# Patient Record
Sex: Female | Born: 1950
Health system: Southern US, Community
[De-identification: ages and names within clinical notes are randomized; demographics above are authoritative.]

## PROBLEM LIST (undated history)

## (undated) DIAGNOSIS — D649 Anemia, unspecified: Secondary | ICD-10-CM

## (undated) DIAGNOSIS — D126 Benign neoplasm of colon, unspecified: Secondary | ICD-10-CM

## (undated) DIAGNOSIS — C801 Malignant (primary) neoplasm, unspecified: Secondary | ICD-10-CM

## (undated) DIAGNOSIS — R109 Unspecified abdominal pain: Secondary | ICD-10-CM

## (undated) DIAGNOSIS — D1803 Hemangioma of intra-abdominal structures: Secondary | ICD-10-CM

## (undated) DIAGNOSIS — I509 Heart failure, unspecified: Secondary | ICD-10-CM

## (undated) DIAGNOSIS — M545 Low back pain, unspecified: Secondary | ICD-10-CM

## (undated) DIAGNOSIS — I10 Essential (primary) hypertension: Secondary | ICD-10-CM

## (undated) DIAGNOSIS — R112 Nausea with vomiting, unspecified: Secondary | ICD-10-CM

## (undated) DIAGNOSIS — M1990 Unspecified osteoarthritis, unspecified site: Secondary | ICD-10-CM

## (undated) DIAGNOSIS — Z9289 Personal history of other medical treatment: Secondary | ICD-10-CM

## (undated) DIAGNOSIS — G629 Polyneuropathy, unspecified: Secondary | ICD-10-CM

## (undated) DIAGNOSIS — K589 Irritable bowel syndrome without diarrhea: Secondary | ICD-10-CM

## (undated) DIAGNOSIS — F419 Anxiety disorder, unspecified: Secondary | ICD-10-CM

## (undated) DIAGNOSIS — R0602 Shortness of breath: Secondary | ICD-10-CM

## (undated) DIAGNOSIS — K219 Gastro-esophageal reflux disease without esophagitis: Secondary | ICD-10-CM

## (undated) DIAGNOSIS — Z9889 Other specified postprocedural states: Secondary | ICD-10-CM

## (undated) DIAGNOSIS — E039 Hypothyroidism, unspecified: Secondary | ICD-10-CM

## (undated) DIAGNOSIS — M797 Fibromyalgia: Secondary | ICD-10-CM

## (undated) DIAGNOSIS — K648 Other hemorrhoids: Secondary | ICD-10-CM

## (undated) DIAGNOSIS — G8929 Other chronic pain: Secondary | ICD-10-CM

## (undated) DIAGNOSIS — K7689 Other specified diseases of liver: Secondary | ICD-10-CM

## (undated) DIAGNOSIS — R51 Headache: Secondary | ICD-10-CM

## (undated) DIAGNOSIS — E7800 Pure hypercholesterolemia, unspecified: Secondary | ICD-10-CM

## (undated) DIAGNOSIS — G4730 Sleep apnea, unspecified: Secondary | ICD-10-CM

## (undated) HISTORY — PX: ABDOMINAL HYSTERECTOMY: SHX81

## (undated) HISTORY — DX: Other chronic pain: G89.29

## (undated) HISTORY — PX: COLONOSCOPY W/ POLYPECTOMY: SHX1380

## (undated) HISTORY — DX: Anxiety disorder, unspecified: F41.9

## (undated) HISTORY — DX: Other specified diseases of liver: K76.89

## (undated) HISTORY — PX: CYSTOCELE REPAIR: SHX163

## (undated) HISTORY — DX: Unspecified abdominal pain: R10.9

## (undated) HISTORY — DX: Low back pain, unspecified: M54.50

## (undated) HISTORY — PX: PARTIAL HYSTERECTOMY: SHX80

## (undated) HISTORY — DX: Heart failure, unspecified: I50.9

## (undated) HISTORY — DX: Fibromyalgia: M79.7

## (undated) HISTORY — DX: Irritable bowel syndrome, unspecified: K58.9

## (undated) HISTORY — DX: Benign neoplasm of colon, unspecified: D12.6

## (undated) HISTORY — DX: Low back pain: M54.5

## (undated) HISTORY — DX: Essential (primary) hypertension: I10

## (undated) HISTORY — DX: Other hemorrhoids: K64.8

## (undated) HISTORY — DX: Gastro-esophageal reflux disease without esophagitis: K21.9

## (undated) HISTORY — DX: Sleep apnea, unspecified: G47.30

## (undated) HISTORY — DX: Hemangioma of intra-abdominal structures: D18.03

---

## 1973-04-02 HISTORY — PX: FRACTURE SURGERY: SHX138

## 1983-04-03 HISTORY — PX: CHOLECYSTECTOMY: SHX55

## 1999-09-02 ENCOUNTER — Encounter: Admission: RE | Admit: 1999-09-02 | Discharge: 1999-09-02 | Payer: Self-pay | Admitting: *Deleted

## 1999-09-11 ENCOUNTER — Encounter: Admission: RE | Admit: 1999-09-11 | Discharge: 1999-09-11 | Payer: Self-pay | Admitting: Internal Medicine

## 1999-12-24 ENCOUNTER — Emergency Department (HOSPITAL_COMMUNITY): Admission: EM | Admit: 1999-12-24 | Discharge: 1999-12-24 | Payer: Self-pay | Admitting: Emergency Medicine

## 1999-12-24 ENCOUNTER — Encounter: Payer: Self-pay | Admitting: Emergency Medicine

## 2000-05-18 ENCOUNTER — Encounter: Admission: RE | Admit: 2000-05-18 | Discharge: 2001-03-17 | Payer: Self-pay | Admitting: *Deleted

## 2000-05-27 ENCOUNTER — Ambulatory Visit (HOSPITAL_COMMUNITY): Admission: RE | Admit: 2000-05-27 | Discharge: 2000-05-27 | Payer: Self-pay | Admitting: Gastroenterology

## 2000-05-27 ENCOUNTER — Encounter (INDEPENDENT_AMBULATORY_CARE_PROVIDER_SITE_OTHER): Payer: Self-pay | Admitting: Specialist

## 2000-12-19 ENCOUNTER — Emergency Department (HOSPITAL_COMMUNITY): Admission: EM | Admit: 2000-12-19 | Discharge: 2000-12-19 | Payer: Self-pay

## 2001-02-28 ENCOUNTER — Emergency Department (HOSPITAL_COMMUNITY): Admission: EM | Admit: 2001-02-28 | Discharge: 2001-02-28 | Payer: Self-pay

## 2001-07-30 ENCOUNTER — Ambulatory Visit (HOSPITAL_COMMUNITY): Admission: RE | Admit: 2001-07-30 | Discharge: 2001-07-30 | Payer: Self-pay | Admitting: Internal Medicine

## 2001-07-30 ENCOUNTER — Encounter: Payer: Self-pay | Admitting: Internal Medicine

## 2001-10-27 ENCOUNTER — Ambulatory Visit (HOSPITAL_COMMUNITY): Admission: RE | Admit: 2001-10-27 | Discharge: 2001-10-27 | Payer: Self-pay | Admitting: Family Medicine

## 2001-10-27 ENCOUNTER — Encounter: Payer: Self-pay | Admitting: Family Medicine

## 2001-10-31 ENCOUNTER — Ambulatory Visit (HOSPITAL_COMMUNITY): Admission: RE | Admit: 2001-10-31 | Discharge: 2001-10-31 | Payer: Self-pay | Admitting: Family Medicine

## 2001-10-31 ENCOUNTER — Encounter: Payer: Self-pay | Admitting: Family Medicine

## 2001-11-17 ENCOUNTER — Encounter: Payer: Self-pay | Admitting: Internal Medicine

## 2001-11-17 ENCOUNTER — Ambulatory Visit (HOSPITAL_COMMUNITY): Admission: RE | Admit: 2001-11-17 | Discharge: 2001-11-17 | Payer: Self-pay | Admitting: Internal Medicine

## 2001-11-20 ENCOUNTER — Encounter (HOSPITAL_COMMUNITY): Admission: RE | Admit: 2001-11-20 | Discharge: 2001-12-20 | Payer: Self-pay | Admitting: Rheumatology

## 2001-11-27 ENCOUNTER — Ambulatory Visit (HOSPITAL_COMMUNITY): Admission: RE | Admit: 2001-11-27 | Discharge: 2001-11-27 | Payer: Self-pay | Admitting: Internal Medicine

## 2001-11-27 ENCOUNTER — Encounter: Payer: Self-pay | Admitting: Internal Medicine

## 2001-12-03 ENCOUNTER — Ambulatory Visit: Admission: RE | Admit: 2001-12-03 | Discharge: 2001-12-03 | Payer: Self-pay | Admitting: Rheumatology

## 2002-01-19 ENCOUNTER — Emergency Department (HOSPITAL_COMMUNITY): Admission: EM | Admit: 2002-01-19 | Discharge: 2002-01-19 | Payer: Self-pay | Admitting: Emergency Medicine

## 2002-02-12 ENCOUNTER — Encounter (HOSPITAL_COMMUNITY): Admission: RE | Admit: 2002-02-12 | Discharge: 2002-03-14 | Payer: Self-pay | Admitting: Rheumatology

## 2002-03-19 ENCOUNTER — Ambulatory Visit (HOSPITAL_COMMUNITY): Admission: RE | Admit: 2002-03-19 | Discharge: 2002-03-19 | Payer: Self-pay | Admitting: Internal Medicine

## 2002-03-19 ENCOUNTER — Encounter: Payer: Self-pay | Admitting: Internal Medicine

## 2002-03-28 ENCOUNTER — Emergency Department (HOSPITAL_COMMUNITY): Admission: EM | Admit: 2002-03-28 | Discharge: 2002-03-28 | Payer: Self-pay | Admitting: Emergency Medicine

## 2002-04-02 ENCOUNTER — Emergency Department (HOSPITAL_COMMUNITY): Admission: EM | Admit: 2002-04-02 | Discharge: 2002-04-02 | Payer: Self-pay | Admitting: Emergency Medicine

## 2002-04-21 ENCOUNTER — Encounter: Admission: RE | Admit: 2002-04-21 | Discharge: 2002-07-20 | Payer: Self-pay

## 2002-05-21 ENCOUNTER — Emergency Department (HOSPITAL_COMMUNITY): Admission: EM | Admit: 2002-05-21 | Discharge: 2002-05-21 | Payer: Self-pay | Admitting: Emergency Medicine

## 2002-05-21 ENCOUNTER — Encounter: Payer: Self-pay | Admitting: Emergency Medicine

## 2002-06-17 ENCOUNTER — Encounter: Admission: RE | Admit: 2002-06-17 | Discharge: 2002-07-17 | Payer: Self-pay | Admitting: Internal Medicine

## 2002-11-02 ENCOUNTER — Encounter
Admission: RE | Admit: 2002-11-02 | Discharge: 2003-01-31 | Payer: Self-pay | Admitting: Physical Medicine & Rehabilitation

## 2003-05-27 ENCOUNTER — Ambulatory Visit (HOSPITAL_COMMUNITY): Admission: RE | Admit: 2003-05-27 | Discharge: 2003-05-27 | Payer: Self-pay | Admitting: Internal Medicine

## 2003-09-07 ENCOUNTER — Emergency Department (HOSPITAL_COMMUNITY): Admission: EM | Admit: 2003-09-07 | Discharge: 2003-09-07 | Payer: Self-pay | Admitting: Emergency Medicine

## 2003-09-09 ENCOUNTER — Ambulatory Visit (HOSPITAL_COMMUNITY): Admission: RE | Admit: 2003-09-09 | Discharge: 2003-09-09 | Payer: Self-pay | Admitting: Internal Medicine

## 2003-09-23 ENCOUNTER — Ambulatory Visit (HOSPITAL_COMMUNITY): Admission: RE | Admit: 2003-09-23 | Discharge: 2003-09-23 | Payer: Self-pay | Admitting: Internal Medicine

## 2003-10-05 ENCOUNTER — Encounter: Payer: Self-pay | Admitting: Cardiology

## 2003-10-08 ENCOUNTER — Ambulatory Visit (HOSPITAL_COMMUNITY): Admission: RE | Admit: 2003-10-08 | Discharge: 2003-10-08 | Payer: Self-pay | Admitting: Internal Medicine

## 2003-10-25 ENCOUNTER — Emergency Department (HOSPITAL_COMMUNITY): Admission: EM | Admit: 2003-10-25 | Discharge: 2003-10-25 | Payer: Self-pay | Admitting: Emergency Medicine

## 2003-11-02 ENCOUNTER — Emergency Department (HOSPITAL_COMMUNITY): Admission: EM | Admit: 2003-11-02 | Discharge: 2003-11-02 | Payer: Self-pay | Admitting: Emergency Medicine

## 2003-11-10 ENCOUNTER — Ambulatory Visit (HOSPITAL_COMMUNITY): Admission: RE | Admit: 2003-11-10 | Discharge: 2003-11-10 | Payer: Self-pay | Admitting: Orthopedic Surgery

## 2003-12-09 ENCOUNTER — Emergency Department (HOSPITAL_COMMUNITY): Admission: EM | Admit: 2003-12-09 | Discharge: 2003-12-10 | Payer: Self-pay | Admitting: Emergency Medicine

## 2004-02-09 ENCOUNTER — Ambulatory Visit: Payer: Self-pay | Admitting: Orthopedic Surgery

## 2004-02-10 ENCOUNTER — Ambulatory Visit (HOSPITAL_COMMUNITY): Admission: RE | Admit: 2004-02-10 | Discharge: 2004-02-10 | Payer: Self-pay | Admitting: Internal Medicine

## 2004-02-14 ENCOUNTER — Ambulatory Visit: Payer: Self-pay | Admitting: Internal Medicine

## 2004-02-16 ENCOUNTER — Ambulatory Visit: Payer: Self-pay | Admitting: Orthopedic Surgery

## 2004-03-15 ENCOUNTER — Ambulatory Visit: Payer: Self-pay | Admitting: Orthopedic Surgery

## 2004-04-19 ENCOUNTER — Ambulatory Visit: Payer: Self-pay | Admitting: Orthopedic Surgery

## 2004-07-15 ENCOUNTER — Emergency Department (HOSPITAL_COMMUNITY): Admission: EM | Admit: 2004-07-15 | Discharge: 2004-07-15 | Payer: Self-pay | Admitting: Emergency Medicine

## 2004-08-31 ENCOUNTER — Inpatient Hospital Stay (HOSPITAL_COMMUNITY): Admission: EM | Admit: 2004-08-31 | Discharge: 2004-09-05 | Payer: Self-pay | Admitting: *Deleted

## 2004-09-02 ENCOUNTER — Ambulatory Visit: Payer: Self-pay | Admitting: Internal Medicine

## 2004-12-29 ENCOUNTER — Ambulatory Visit (HOSPITAL_COMMUNITY): Admission: RE | Admit: 2004-12-29 | Discharge: 2004-12-29 | Payer: Self-pay | Admitting: Internal Medicine

## 2005-01-05 ENCOUNTER — Ambulatory Visit (HOSPITAL_COMMUNITY): Admission: RE | Admit: 2005-01-05 | Discharge: 2005-01-05 | Payer: Self-pay | Admitting: Internal Medicine

## 2005-03-04 ENCOUNTER — Emergency Department (HOSPITAL_COMMUNITY): Admission: EM | Admit: 2005-03-04 | Discharge: 2005-03-04 | Payer: Self-pay | Admitting: Emergency Medicine

## 2005-08-17 ENCOUNTER — Emergency Department (HOSPITAL_COMMUNITY): Admission: EM | Admit: 2005-08-17 | Discharge: 2005-08-17 | Payer: Self-pay | Admitting: Emergency Medicine

## 2006-10-05 ENCOUNTER — Emergency Department (HOSPITAL_COMMUNITY): Admission: EM | Admit: 2006-10-05 | Discharge: 2006-10-06 | Payer: Self-pay | Admitting: Emergency Medicine

## 2006-10-08 ENCOUNTER — Ambulatory Visit (HOSPITAL_COMMUNITY): Admission: RE | Admit: 2006-10-08 | Discharge: 2006-10-08 | Payer: Self-pay | Admitting: Internal Medicine

## 2007-04-12 ENCOUNTER — Emergency Department (HOSPITAL_COMMUNITY): Admission: EM | Admit: 2007-04-12 | Discharge: 2007-04-12 | Payer: Self-pay | Admitting: Emergency Medicine

## 2007-04-12 ENCOUNTER — Encounter: Payer: Self-pay | Admitting: Cardiology

## 2007-06-27 ENCOUNTER — Ambulatory Visit: Payer: Self-pay | Admitting: Internal Medicine

## 2007-07-07 ENCOUNTER — Ambulatory Visit: Payer: Self-pay | Admitting: Gastroenterology

## 2007-07-24 ENCOUNTER — Ambulatory Visit: Payer: Self-pay | Admitting: Internal Medicine

## 2007-07-30 ENCOUNTER — Ambulatory Visit (HOSPITAL_COMMUNITY): Admission: RE | Admit: 2007-07-30 | Discharge: 2007-07-30 | Payer: Self-pay | Admitting: Internal Medicine

## 2007-08-04 ENCOUNTER — Ambulatory Visit: Payer: Self-pay | Admitting: Internal Medicine

## 2007-08-04 ENCOUNTER — Ambulatory Visit (HOSPITAL_COMMUNITY): Admission: RE | Admit: 2007-08-04 | Discharge: 2007-08-04 | Payer: Self-pay | Admitting: Internal Medicine

## 2007-09-23 ENCOUNTER — Ambulatory Visit (HOSPITAL_COMMUNITY): Admission: RE | Admit: 2007-09-23 | Discharge: 2007-09-23 | Payer: Self-pay | Admitting: Internal Medicine

## 2007-11-20 ENCOUNTER — Ambulatory Visit (HOSPITAL_COMMUNITY): Admission: RE | Admit: 2007-11-20 | Discharge: 2007-11-20 | Payer: Self-pay | Admitting: Internal Medicine

## 2008-02-10 DIAGNOSIS — R109 Unspecified abdominal pain: Secondary | ICD-10-CM

## 2008-02-10 DIAGNOSIS — I509 Heart failure, unspecified: Secondary | ICD-10-CM | POA: Insufficient documentation

## 2008-02-10 DIAGNOSIS — F411 Generalized anxiety disorder: Secondary | ICD-10-CM | POA: Insufficient documentation

## 2008-02-10 DIAGNOSIS — K219 Gastro-esophageal reflux disease without esophagitis: Secondary | ICD-10-CM

## 2008-02-10 DIAGNOSIS — K5900 Constipation, unspecified: Secondary | ICD-10-CM | POA: Insufficient documentation

## 2008-02-10 DIAGNOSIS — M797 Fibromyalgia: Secondary | ICD-10-CM | POA: Insufficient documentation

## 2008-02-10 DIAGNOSIS — D1803 Hemangioma of intra-abdominal structures: Secondary | ICD-10-CM

## 2008-02-10 DIAGNOSIS — K7689 Other specified diseases of liver: Secondary | ICD-10-CM | POA: Insufficient documentation

## 2008-02-10 DIAGNOSIS — M545 Low back pain, unspecified: Secondary | ICD-10-CM | POA: Insufficient documentation

## 2008-02-10 DIAGNOSIS — K589 Irritable bowel syndrome without diarrhea: Secondary | ICD-10-CM | POA: Insufficient documentation

## 2008-02-10 DIAGNOSIS — I10 Essential (primary) hypertension: Secondary | ICD-10-CM

## 2008-02-10 DIAGNOSIS — K648 Other hemorrhoids: Secondary | ICD-10-CM | POA: Insufficient documentation

## 2008-05-14 ENCOUNTER — Encounter: Payer: Self-pay | Admitting: Cardiology

## 2008-07-16 ENCOUNTER — Encounter (INDEPENDENT_AMBULATORY_CARE_PROVIDER_SITE_OTHER): Payer: Self-pay | Admitting: *Deleted

## 2008-07-22 ENCOUNTER — Encounter: Payer: Self-pay | Admitting: Internal Medicine

## 2008-07-26 ENCOUNTER — Encounter: Payer: Self-pay | Admitting: Internal Medicine

## 2008-07-27 ENCOUNTER — Ambulatory Visit (HOSPITAL_COMMUNITY): Admission: RE | Admit: 2008-07-27 | Discharge: 2008-07-27 | Payer: Self-pay | Admitting: Internal Medicine

## 2008-07-29 ENCOUNTER — Telehealth (INDEPENDENT_AMBULATORY_CARE_PROVIDER_SITE_OTHER): Payer: Self-pay

## 2008-08-11 ENCOUNTER — Ambulatory Visit: Payer: Self-pay | Admitting: Cardiology

## 2008-09-03 ENCOUNTER — Ambulatory Visit: Payer: Self-pay | Admitting: Cardiology

## 2009-01-12 DIAGNOSIS — R079 Chest pain, unspecified: Secondary | ICD-10-CM

## 2009-04-14 ENCOUNTER — Encounter: Payer: Self-pay | Admitting: Internal Medicine

## 2009-07-13 ENCOUNTER — Encounter: Payer: Self-pay | Admitting: Cardiology

## 2009-09-30 ENCOUNTER — Encounter: Payer: Self-pay | Admitting: Cardiology

## 2009-10-01 ENCOUNTER — Encounter: Payer: Self-pay | Admitting: Cardiology

## 2009-10-02 ENCOUNTER — Encounter: Payer: Self-pay | Admitting: Cardiology

## 2009-10-04 ENCOUNTER — Encounter: Payer: Self-pay | Admitting: Cardiology

## 2009-10-04 ENCOUNTER — Ambulatory Visit: Payer: Self-pay | Admitting: Cardiology

## 2009-10-06 ENCOUNTER — Encounter: Payer: Self-pay | Admitting: Cardiology

## 2009-10-07 ENCOUNTER — Encounter: Payer: Self-pay | Admitting: Cardiology

## 2009-10-20 ENCOUNTER — Encounter: Payer: Self-pay | Admitting: Internal Medicine

## 2010-01-25 ENCOUNTER — Encounter: Admission: RE | Admit: 2010-01-25 | Discharge: 2010-01-25 | Payer: Self-pay | Admitting: Diagnostic Neuroimaging

## 2010-03-20 ENCOUNTER — Ambulatory Visit: Payer: Self-pay | Admitting: Cardiology

## 2010-04-23 ENCOUNTER — Encounter: Payer: Self-pay | Admitting: Internal Medicine

## 2010-04-23 ENCOUNTER — Encounter (INDEPENDENT_AMBULATORY_CARE_PROVIDER_SITE_OTHER): Payer: Self-pay | Admitting: Internal Medicine

## 2010-05-02 NOTE — Letter (Signed)
Summary: OFFICE NOTE/[facility]  OFFICE NOTE/[facility]   Imported By: Diana Eves 04/14/2009 09:32:56  _____________________________________________________________________  External Attachment:    Type:   Image     Comment:   External Document

## 2010-05-02 NOTE — Letter (Signed)
Summary: [facility] note  [facility] note   Imported By: Minna Merritts 10/20/2009 16:38:35  _____________________________________________________________________  External Attachment:    Type:   Image     Comment:   External Document

## 2010-05-04 NOTE — Letter (Signed)
Summary: [facility] D/C DR. Orvan Falconer  [facility] D/C DR. Orvan Falconer   Imported By: Zachary George 03/20/2010 09:31:23  _____________________________________________________________________  External Attachment:    Type:   Image     Comment:   External Document

## 2010-07-13 ENCOUNTER — Ambulatory Visit (HOSPITAL_COMMUNITY)
Admission: RE | Admit: 2010-07-13 | Discharge: 2010-07-13 | Disposition: A | Discharge status: Home / Self Care | Payer: Medicare Other | Source: Ambulatory Visit | Attending: Family Medicine | Admitting: Family Medicine

## 2010-07-13 ENCOUNTER — Other Ambulatory Visit (HOSPITAL_COMMUNITY): Payer: Self-pay | Admitting: Family Medicine

## 2010-07-13 DIAGNOSIS — M25559 Pain in unspecified hip: Secondary | ICD-10-CM | POA: Insufficient documentation

## 2010-07-13 DIAGNOSIS — F411 Generalized anxiety disorder: Secondary | ICD-10-CM

## 2010-07-13 DIAGNOSIS — G4700 Insomnia, unspecified: Secondary | ICD-10-CM

## 2010-07-13 DIAGNOSIS — M76899 Other specified enthesopathies of unspecified lower limb, excluding foot: Secondary | ICD-10-CM

## 2010-08-15 NOTE — Assessment & Plan Note (Signed)
NAMEMarland Kitchen  Cowan, Margaret                   CHART#:  [id number]   DATE:  07/24/2007                       DOB:  January 26, 1951   Follow-up gastroesophageal reflux disease, constipation, IBS.  She was  hospitalized back in January at [facility] .  We have those records.  They  were reviewed and chronicled in Dorris Fetch, NP, July 08, 2007  dictation.  Significant findings include a couple of small colonic  adenomas for which I will recommend she have a surveillance colonoscopy  in 5 years.  Reflux symptoms were controlled previously on Nexium, and  insurance issues with that agent, and we switched to Prevacid and that  did not work.  That agent has not worked and she has breakthrough  symptoms.  She has been on regimens of Amitiza.  She was taking it  without food.  More recently, she had been taking 8 mcg at bedtime with  some food, which ha helped with abdominal cramps.  Last evening, she  took 24 mcg Amitiza with some crackers at 9 o'clock.  She had minimal  cramping and had a normal bowel movement.  She has been taking some  __________ concomitantly, which Dr. Sherwood Gambler has prescribed, per her  report.  She is not having any rectal bleeding or melena.   CURRENT MEDICATIONS:  See updated list.   ALLERGIES:  1. SULFA.  2. AMITRIPTYLINE.   PHYSICAL EXAMINATION:  GENERAL:  Today, she really looks pretty good.  VITAL SIGNS:  Weight 216, height 5 feet 4 inches, temperature 98, BP  112/72, pulse 64.  SKIN:  Warm and dry.  CHEST:  Lungs clear to auscultation.  CARDIOVASCULAR:  Regular rate and rhythm without murmur, gallop or rub.  ABDOMEN:  Obese, positive bowel sounds, soft with minimal suprapubic  tenderness but no appreciable mass or organomegaly.   In addition, Margaret Cowan tells me Dr. Sherwood Gambler did a UA, which was reported  to be negative.   ASSESSMENT:  1. Gastroesophageal reflux disease (GERD) symptoms responsive Nexium      but refractory to Prevacid.  We will get her some samples and try  to get her insurance company to pay for Nexium, which has worked      for her along the way.  2. History of colonic adenoma, surveillance recommended 5 years.  3. Irritable bowel syndrome (IBS), constipation predominant.  I think      we have to use a novel regimen of Amitiza in this lady.  Amitiza      does work, but 24 mcg b.i.d. causes diarrhea.  So, we will go along      with Amitiza 24 mcg gel cap at 9 o'clock in the evening with food      and have her keep a stool diary for the next month.  She is to take      Amitiza once daily.  I have asked her to drop off on the Bentyl,      although we can get some antispasmodic effect.  That agent would      undermine our goal of treating constipation with Amitiza.   I plan to see this nice lady back in one month and see how she is doing.       Jonathon Bellows, M.D.  Electronically Signed     RMR/MEDQ  D:  07/24/2007  T:  07/24/2007  Job:  [id number]   cc:   Madelin Rear. Sherwood Gambler, MD

## 2010-08-15 NOTE — Assessment & Plan Note (Signed)
NAMEMarland Kitchen  Margaret, Cowan                   CHART#:  [id number]   DATE:  06/27/2007                       DOB:  Jul 20, 1950   Patient formerly seen by Dr. Karilyn Cota for multiple GI complaints.  She  returns after not being seen here since 2006.  She has longstanding  abdominal pain felt to be in part related to abdominal wall syndrome and  constipation.  She is on high dose narcotic therapy for chronic back  pain.  She has a history of iron deficiency.  She has been evaluated by  Dr. Herbert Moors over at [facility] as well as Dr. Karilyn Cota, and more  recently by Dr. Carla Drape during a hospitalization at [facility] in  January of this year.  She describes undergoing an extensive evaluation  including MRCP, EGD, colonoscopy without any significant findings.  She  was ultimately started on Lexapro.  Bentyl and HyaMax was continued.  She has been on MiraLax and Amitiza for constipation.  She has made the  appointment here today because she wants to stay in this practice even  though she lives in [place] where Dr. Karilyn Cota is now practicing.  She  continues to be followed primarily by Dr. Artis Delay.  Margaret Cowan tells  me her big problem now she has lower abdominal pain each morning and  does not have a bowel movement unless she takes Amitiza.  She has a  practice of getting up at 4 a.m., taking Amitiza on an empty stomach,  then gets nauseated to the point where she vomits and has to take  Phenergan a couple hours later and finally has a bowel movement.  She is  only taking Amitiza once early in the morning daily.  The epigastric  pain she was having previously and more recently, for which she was  evaluated at [facility], has subsided.  She takes Dilaudid 4 mg four to  fives times daily routinely for chronic back pain, see below.   PAST MEDICAL HISTORY:  Significant for:  1. Hypertension.  2. Anxiety neurosis.  3. Hyperlipidemia.  4. Multiple fractures.  5. Chronic low back pain.  6. Fibromyalgia.  7.  Irritable bowel syndrome, constipation predominant.  8. Distant history of colonic polyps 2002 down in [place].  9. EGD in 2005 by Dr. Karilyn Cota that revealed esophageal web and the      cervical esophagus was dilated up with a 56 [place] Maloney dilator.  10.History of hepatic hemangioma and hepatic cyst, status post      cholecystectomy for symptomatic cholelithiasis.   CURRENT MEDICATIONS:  1. Dilaudid 4 mg four to five times daily.  2. Lexapro 10 mg daily.  3. HyaMax 0.125 mg q.i.d.  4. Bentyl 20 mg q.i.d.  5. Phenergan 25 mg daily p.r.n.  6. Prevacid 30 mg daily.  7. Neurontin 300 mg daily.  8. Atenolol 50 mg daily.  9. MiraLax 17 g orally daily.   ALLERGIES:  1. SULFA.  2. AMITRIPTYLINE.  3. SHE HAS DYSTONIA WITH REGLAN AND IS NOT TAKING THAT AGENT ANY      LONGER.   FAMILY HISTORY:  Mother is alive with reflux and irritable bowel  syndrome.  No family history of GI malignancy.   SOCIAL HISTORY:  1. Patient is single.  2. She has one child.  3. She has  been married 9 times.  4. She describes being very highly anxious in social settings.  5. She is on disability.  6. No tobacco.  7. No alcohol.  8. No illicit drugs.   REVIEW OF SYSTEMS:  No chest pain, no dyspnea on exertion, no fever or  chills.  No melena, rectal bleeding.  Weight is down 4 pounds from where  we weighed her in 2005.  She appears somewhat anxious, no acute  distress, weight is 215, height 5 feet 4 inches, temperature 98, BP  124/80, pulse 76.  Skin:  Warm and dry, there is no jaundice.  Chest:  Lungs are clear to auscultation.  Cardiac Exam:  Regular rate and rhythm  without murmur, gallop, rub.  Breast Exam:  Deferred.  Abdomen:  Obese,  positive bowel sounds, soft.  She has some midline infraumbilical  tenderness to palpation, no appreciable mass or organomegaly.  Extremities:  No edema.   IMPRESSION:  Margaret Cowan is a pleasant 60 year old lady with a long  complicated GI history who has  been extensively evaluated here and  elsewhere.  Her complaint today largely relates to lower abdominal pain  along with intermittent nausea and constipation in the setting of  polypharmacy including high dose chronic daily narcotic therapy.   I am somewhat struck that she is on HyaMax as well as high dose Bentyl  at the same time she is taking MiraLax and Amitiza to facilitate bowel  function.  I am most concerned she is taking Amitiza on an empty stomach  early in the morning and then is having significant nausea.   RECOMMENDATIONS:  1. Stop Bentyl.  2. May continue HyaMax.  3. Increase Amitiza to 8 mcg twice daily and I gave her specific      instructions on taking the Amitiza DURING BREAKFAST and then a      second dose DURING THE EVENING MEAL.  This should alleviate her      nausea.  4. She is to continue taking her Prevacid 30 mg orally daily.  5. Will retrieve records relating to her hospitalization and GI      evaluation done at [facility] back in January of this year.  6. I told Margaret Cowan if she is not appreciating significant improvement      in her GI symptoms by the first of week she is to let me know;      otherwise, will plan to see this nice lady back in the office in 1      month for a recheck.       Jonathon Bellows, M.D.  Electronically Signed     RMR/MEDQ  D:  06/27/2007  T:  06/27/2007  Job:  [id number]   cc:   Madelin Rear. Sherwood Gambler, MD

## 2010-08-15 NOTE — Assessment & Plan Note (Signed)
NAME:  Margaret, Cowan                   CHART#:  [id number]   DATE:  07/07/2007                       DOB:  [birth year]   CHIEF COMPLAINT:  Lower abdominal pain, diarrhea and nausea for 2  weeks/history of IBS, constipation predominant, complicated GERD.   SUBJECTIVE:  Margaret Cowan is a 60 year old female.  She has history of  abdominal pain.  She actually told my nurse that she was having low  abdominal pain below the navel with diarrhea.  However, upon further  questioning when I came into the room. she is now complaining of  epigastric pain.  She tells me that she has had suprapubic pain as well  as left upper quadrant pain too.  She tells me that every morning she  awakens around 4:00 a.m. with significant cramping and diaphoresis.  She  goes to the bathroom.  She may have a loose stool and nausea for about 4  hours.  She has recently changed her prescription pain medications which  are prescribed through the [facility] from four times per  day Dilaudid down to once per day.  She has previous history of  constipation.  Was taking Amitiza every night but tells me she has not  had to use it recently due to her loose stools.  She denies any urinary  symptoms including dysuria, hematuria, increased urinary frequency.  Her  weight has remained stable.  Denies any fever or chills.  She tells me  she previously would go up to a day without a bowel movement.  More  recently she is having one large bowel movement each morning which is  waking her from sleep.  She is quite concerned that she has some type of  infection.  She denies any rectal bleeding or melena.  She has been  seen at [facility] by Dr. [name] and has been  treated for abdominal wall pain there.  She is adamant about saying this  pain today is quite different than what she has experienced previously.  She has also been taking MiraLax 17 gm daily in addition to Amitiza  which she just recently  discontinued.   CURRENT MEDICATIONS:  See list of 07/07/07.   ALLERGIES:  SULFA.   OBJECTIVE:  VITAL SIGNS:  Weight 212 pounds, height 64 inches, temp  97.9, blood pressure 120/70, and pulse  56.  GENERAL:  She is a well-developed, well-nourished Caucasian female in no  acute distress.  She does appear quite anxious but, in reading through  the records, this appears to be her baseline.  HEENT.  Sclerae clear, nonicteric.  Conjunctivae pink.  Oropharynx pink  and moist without lesions.  CHEST:  Heart regular rate and rhythm,  normal S1-S2.  No murmurs, clicks, rubs or gallops.  ABDOMEN:  Positive  bowel sounds x4.  No bruits auscultated.  She has mild tenderness to her  entire abdomen on deep palpation.  There is no rebound tenderness or  guarding.  No hepatosplenomegaly or mass.  EXTREMITIES:  Without clubbing or edema bilaterally.  RECTAL:  No external lesions visualized.  Good sphincter stone.  No  internal mass is palpated.  A small amount of heme-negative stool was  obtained from the vault.   ASSESSMENT:  Margaret Cowan is a 60 year old female with history of chronic  irritable bowel syndrome,  fibromyalgia, chronic low back pain, anxiety  neuroses, and chronic gastroesophageal reflux disease.  She comes in  today complaining of abdominal pain saying that this is much different  than her usual pain.  Upon questioning, she tells me that this is a low  suprapubic pain which is much different than her typical epigastric or  upper abdominal pain.  However, upon further questioning, she is also  complaining of left upper quadrant pain and so it is really difficult to  get a good handle on her symptoms.  She seems mostly concerned with the  fact that her Dilaudid has been decreased from four times a day to once  per day through the [facility] pain clinic and I am not quite sure what  is going on with that.  She is also quite concerned that she may have  infection.  Most of her symptoms  appear to be related to irritable  bowel syndrome.  She has a history of constipation.  She is now having  loose stools.  Timing of her Amitiza and MiraLax may be causing her to  awaken in the middle of the night with a loose stool as well as due to  the decrease in Dilaudid use which may have had a constipating effect on  her bowel movements.  She does not appear acutely ill, but does appear  quite anxious.   PLAN:  1. Check CBC, LFTs and MET 7.  2. I would like her to begin Align once daily.  3. She can begin her fiber supplement of choice.  4. Further recommendations pending labs, however, she is told, if her      pain becomes severe or she has worsening symptoms, she is to go      immediately to the emergency room and she agrees with this plan.       Lorenza Burton, N.P.  Electronically Signed     Kassie Mends, M.D.  Electronically Signed    KJ/MEDQ  D:  07/08/2007  T:  07/08/2007  Job:  [id number]   cc:   [name], M.D.
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
## 2010-08-15 NOTE — Assessment & Plan Note (Signed)
NAMEMarland Kitchen  ELINA, STRENG                   CHART#:  [id number]   DATE:  08/04/2007                       DOB:  12-Apr-1950   CHIEF COMPLAINT:  Left-sided abdominal pain, nausea and vomiting.   PROBLEM LIST:  1. Gastroesophageal reflux disease.  2. Constipation.  3. Irritable bowel syndrome.  4. Chronic pain followed by the pain clinic.  5. Colonic adenoma; colonoscopy by Dr. Gwenevere Abbot April 22, 2007 showed      internal hemorrhoids, a 5 mm ascending colon polyp and a 3 mm      ascending colon polyp, and tubular adenomata.  6. Colonoscopy September 05, 2005; prominent ileocecal valve, probable      lipoma, moderate hemorrhoids; otherwise, normal colon.  7. Gastritis on esophagogastroduodenoscopy on April 21, 2007 by Dr.      Gwenevere Abbot with biopsy suggestive of chemical gastritis with minimal      chronic inflammatory and reactive changes, negative for      Helicobacter pylori.  8. Hypertension.  9. Congestive heart failure.  10.Constipation predominantly from irritable bowel syndrome.  11.Chronic low back pain.  12.Status post total cholecystectomy.  13.Status post hysterectomy.  14.Anxiety disorder.  15.Fibromyalgia.  16.Hepatic hemangioma and hepatic cysts.  17.Esophagogastroduodenoscopy in 2005 by Dr. Karilyn Cota, which an      esophageal web of the cervical esophagus dilated with a 36 French      Maloney dilator.  18.History of abdominal wall pain evaluated and treated by Dr. Warnell Forester and Dr. Beverly Gust [facility].   SUBJECTIVE:  The patient is a 60 year old female.  She was just seen by  Dr. Jena Gauss July 24, 2007 and was felt to have constipation at that time.  She was encouraged to take Amitiza for this.  She was instructed to take  a 24 mcg zero Gelcap and 9 o'clock in the evening with food and to  discontinue her Bentyl.  She continues to complain of a left-sided  abdominal pain.  She is had some sweating, diaphoresis and  loss of  appetite.  She is having small stools.  She does not feel that she is  constipated therefore she has not had to take the Amitiza.  She feels as  though her stool Won't come out.  She complains of left lower quadrant  and left upper quadrant pain as well as epigastric pain.  She was seen  by Dr. Sherwood Gambler twice last week as well.  She has not had any vomiting.  She has had 3 or 4 bowel movements per day.  She did use enemas recently  as well and she took three doses of MiraLax.  She had acute abdominal  films on July 30, 2007, which showed a 2-3 mm diameter calcification in  the right pelvis, interval scybalous versus distal right ureteral stone.   We had attempted to obtain medical records from her pain management  physician in [place]; however, she is not willing to sign a HIPAA  release for information for our review.  She reiterates this again  today.   CURRENT MEDICATIONS:  See updated list from Aug 04, 2007.   ALLERGIES:  SULFA AND AMITRIPTYLINE.   OBJECTIVE:  VITAL SIGNS:  Weight 209 pounds, height 64 inches,  temperature 98.4, blood  pressure 118/90 and pulse 76.  GENERAL:  The patient is a quite anxious-appearing Caucasian female who  is alert, oriented, pleasant and cooperative.  HEENT:  Sclerae clear and nonicteric.  Conjunctivae pink.  Oropharynx;  pink and moist without lesions.  HEART:  The heart has a regular rate and rhythm.  Normal S1 and S2.  ABDOMEN:  Positive bowel sounds x4.  No bruits auscultated.  She has  generalized tenderness throughout her entire abdomen and she has  significant left lower quadrant, left upper quadrant and epigastric  tenderness on deep palpation.  There is no rebound tenderness or  guarding.  No hepatosplenomegaly or mass.  EXTREMITIES:  The extremities are without edema or clubbing bilaterally.   ASSESSMENT:  The patient is a 60 year old female with abdominal pain,  etiology unknown.  She has also had some nausea without vomiting.   Abdominal films were suggestive of possible renal lithiasis, which could  be causing her left-sided abdominal pain and nausea.  It could also  cause a sense of constipation/obstipation.  I feel we should rule out  pancreatitis as well.  I doubt we are dealing with a small bowel  obstruction.  Symptoms seem out of proportion to objective findings.   PLAN:  1. Check CBC, LFTs, amylase and lipase, urinalysis, urine drug screen,      and MET 7.  2. I have asked the patient to reconsider giving her consent so that      we can get records from her pain management physician.  I feel this      is very pertinent in her care given the fact that we are giving her      medications as well as with her having complaints of pain.      Narcotics could be worsening her constipation and we do not want to      give a medications which could have potential interactions.  Again,      she is not willing to give consent for this and I will discuss this      further with Dr. Jena Gauss as I feel it is pertinent to continuity of      her care and to maintain an appropriate patient-Brittiany Wiehe      relationship.  3. CT scan abdomen and pelvis with IV and oral contrast as soon as      possible for further evaluation.       Lorenza Burton, N.P.  Electronically Signed     R. Roetta Sessions, M.D.  Electronically Signed    KJ/MEDQ  D:  08/06/2007  T:  08/06/2007  Job:  [id number]   cc:   Madelin Rear. Sherwood Gambler, MD

## 2010-08-18 NOTE — Op Note (Signed)
NAME:  Margaret Cowan, KUNZ                            ACCOUNT NO.:  [id number]   MEDICAL RECORD NO.:  [id number]                   PATIENT TYPE:  AMB   LOCATION:  DAY                                  FACILITY:  [facility]   PHYSICIAN:  Lionel December, M.D.                 DATE OF BIRTH:  May 18, 1950   DATE OF PROCEDURE:  10/08/2003  DATE OF DISCHARGE:                                 OPERATIVE REPORT   PROCEDURE:  Esophagogastroduodenoscopy with esophageal dilatation.   INDICATION:  Riti is a 60 year old Caucasian female with multiple medical  problems, who presents with dysphagia as well as epigastric pain.  She has  chronic GERD and is on PPI, was admitted with acute onset of nausea, some  vomiting, and noted to be profoundly anemic with very low MCV, consistent  with iron deficiency anemia which she has history of.  She has received  three units of PRBCs, and her hemoglobin is up.  She also complains of  dysphagia.  She has chronic GERD and is on PPI, but she states she still has  breakthrough symptoms.  She recently had abdominal CT followed by MRI.  She  has a large hepatic cyst along with smaller cysts and another lesion along  the lateral aspect of the right lobe which has typical criteria for  hemangioma on MR.  These lesions have remained stable since May 2001.  It is  felt that these lesions have nothing to do with the pain.  She is undergoing  diagnostic EGD with ED.  Procedure risks were reviewed with the patient.  Informed consent was obtained.   PREOPERATIVE MEDICATIONS:  1. Cetacaine spray for pharyngeal topical anesthesia.  2. Demerol 100 mg IV.  3. Versed 15 mg IV.   FINDINGS:  Procedure performed in endoscopy suite.  The patient's vital  signs and O2 saturations were monitored during the procedure and remained  stable.  Patient was placed left lateral decubitus position and Olympus  video scope was passed via oropharynx without any difficulty into esophagus.   ESOPHAGUS:   Mucosa of the esophagus was normal.  Squamocolumnar junction was  unremarkable.  No hernia was noted.   STOMACH:  It had some bile in it, but there was no food debris.  Stomach  distended very well with insufflation.  Folds of the proximal stomach were  normal.  Examination of the mucosa at gastric body, antrum, pyloric channel,  as well as angularis, fundus, and cardia was normal.   DUODENUM:  Examination of the bulb and postbulbar duodenum was normal.  Endoscope was withdrawn.  Esophagus was dilated by passing 56 [place] Maloney  dilator to full insertion.  Esophagus was reexamined postdilation, and there  was a small linear tear of cervical esophagus indicative of esophageal web.  Endoscope was withdrawn.  The patient tolerated the procedure well.   FINAL DIAGNOSIS:  Esophageal web in cervical esophagus  which was apparent  after ED with 56 French Maloney dilator.   No endoscopic evidence of esophagitis or peptic ulcer disease.   Her epigastric pain could be related to bile reflux in the stomach or part  in parcel of her IBS.  Since she is having bladder problems, we will hold  off using antispasmodic, and we will try her on Carafate.   RECOMMENDATIONS:  1. She will continue antireflux measures and Nexium as before.  2. She can use OTC Zantac for breakthrough symptoms on p.r.n. basis.  3. Carafate liquid 1 g a.c. and q.h.s.  The patient will call with progress     report in 2 weeks.      ___________________________________________                                            Lionel December, M.D.   NR/MEDQ  D:  10/08/2003  T:  10/09/2003  Job:  [id number]   cc:   Madelin Rear. Sherwood Gambler, M.D.  [address]  [place]  [place] [address]  Fax: [phone]

## 2010-08-18 NOTE — Consult Note (Signed)
NAME:  Margaret Cowan, Margaret Cowan NO.:  [id number]   MEDICAL RECORD NO.:  [id number]                   PATIENT TYPE:   LOCATION:                                       FACILITY:   PHYSICIAN:  Aundra Dubin, M.D.            DATE OF BIRTH:   DATE OF CONSULTATION:  DATE OF DISCHARGE:                                   CONSULTATION   CHIEF COMPLAINT:  Back pain, knees, insomnia.   HISTORY OF PRESENT ILLNESS:  The patient did have the sleep study on  December 03, 2001 with the report indicating moderate sleep apnea/hypopnea  syndrome.  She did not qualify for the split protocol and therefore was not  tried on CPAP.  There was moderate to light snoring with some mild oxygen  desaturation.  The patient reports that she is still not sleeping well.  She  seems to wake up very frequently throughout the night because of the back  and neck pain.  She tells me that she has been tried on amitriptyline, which  caused hallucinations, and Ambien did not work for her.  She tells me today  that she is on Zanaflex, which she did not mention when I saw her in August.  This has helped some with the sleep.  She aches in her neck and back  primarily.  Also, her knees hurt.  She has had the knees x-rayed about one  year ago at an outside facility.  Her weight is up 7 pounds.  She is not  having chest pain and has some mild shortness of breath.  She has had a  headache for three days at this point.   MEDICATIONS:  1. Zanaflex 4 mg q.d. and 8 mg h.s.  2. MiraLax p.r.n.  3. Paxil CR 25 mg b.i.d.  4. Xanax 1 mg t.i.d.  5. Toprol XL b.i.d.  6. Prevacid 30 mg b.i.d.  7. Tylox 4-6 tablets per day.  8. Meperidine/promethazine presently not taking.   PHYSICAL EXAMINATION:  VITAL SIGNS:  Weight 216 pounds, blood pressure  140/80, respirations 16.  LUNGS:  Clear.  NECK:  Negative JVD.  HEART:  Regular.  EXTREMITIES:  Lower extremities:  No edema.  MUSCULOSKELETAL:  Hands, wrists,  elbows, and shoulders have a good range of  motion and she is tender with the shoulder movement.  Trigger points are  moderately tender throughout, especially around the shoulder, neck, occiput,  and anterior chest.  The knees still can flex only to about 115 degrees.   ASSESSMENT AND PLAN:  1. Polyarthralgia and insomnia/back pain.  The back pain is continuing to     keep her up at night and there is moderate sleep apnea.  Now, knowing     that she is on Zanaflex, I would not change further medicines, as she is     on two medicines for sleep.  There are a limited  amount of options we     have to try and help her.  I will follow her course and, if this is not     improving, then possibly having her evaluated by a pulmonologist to     further delve into the sleep apnea issues with her is appropriate.  2. Knee pain.  We are making arrangements for the x-rays to be sent over so     that I can evaluate these.  She does have limited motion on the physical     exam.  She is not on an nonsteroidal anti-inflammatory drug and we may     need to add one.   She will return in two months.                                               Aundra Dubin, M.D.    WWT/MEDQ  D:  12/18/2001  T:  12/19/2001  Job:  [id number]   cc:   Madelin Rear. Sherwood Gambler, M.D.  [address]  [place]  [place] [address]  Fax: [phone]
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
## 2010-08-18 NOTE — Assessment & Plan Note (Signed)
NAME:  Margaret Cowan, Margaret Cowan                   CHART#:  [id number]   DATE:                                   DOB:  1951/01/12   ADDENDUM:  Dictated for Dr. Kassie Mends.   I did receive records from [facility] where she had been evaluated by  Dr. Rolland Bimler.  She underwent a colonoscopy on April 22, 2007.  She  was found to have prominent internal hemorrhoids of 5 mm and colon polyp  and a 3 mm ascending colon polyp, all of which were tubular adenomas.  She had a previous colonoscopy on September 05, 2005, by Dr. Carla Drape.  She was found to have prominent ileocecal valve probably lipoma,  moderate hemorrhoids, otherwise normal colonoscopy.  She had an EGD by  Dr. Gwenevere Abbot on April 21, 2007.  She was found to have a largely normal  endoscopy, although a mild degree of gastritis noted in the gastric body  and biopsied, which was negative for H. pylori.  It showed minimal  chronic inflammatory reactive changes, suggestive of chemical gastritis.   In light of these findings, it appears that she has been seeking  multiple medical providers and undergoing repeat procedures.  In light  of these findings, will also request the most recent office note from  [facility] as well, for Dr. Cira Servant to further review.       Lorenza Burton, N.P.  Electronically Signed     KJ/MEDQ  D:  07/08/2007  T:  07/08/2007  Job:  [id number]   cc:   [name], M.D.
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
## 2010-08-18 NOTE — Consult Note (Signed)
NAME:  Margaret Cowan, NASS NO.:  [id number]   MEDICAL RECORD NO.:  [id number]                   PATIENT TYPE:   LOCATION:                                       FACILITY:  [facility]   PHYSICIAN:  Aundra Dubin, M.D.            DATE OF BIRTH:  1950-04-10   DATE OF CONSULTATION:  02/12/2002  DATE OF DISCHARGE:                                   CONSULTATION   CHIEF COMPLAINT:  Aching all over, knee pain.   REASON FOR CONSULTATION:  The patient is a 60 year old woman who has sleep  apnea and obesity.  She returns with her son today saying that she is  hurting all over.  We discussed again that she has very significant snoring.  On the sleep study she did not meet criteria for a trial of CPAP.  She wakes  up tired.  She hurts in the right knee and ankle more than the left.  She  aches in her head and she has headaches frequently.  Her weight is stable  but down 3 pounds.  There have been no swollen joints or visual changes.   She is being evaluated by a cardiologist and she is scheduled for a stress  test and possible catheterization.   MEDICINES:  1. Zanaflex 4 mg q.d., 8 mg h.s.  2. MiraLax p.r.n.  3. Paxil CR 25 mg b.i.d.  4. Xanax 1 mg t.i.d.  5. Toprol-XL q.d.  6. Prevacid 30 mg b.i.d.  7. Tylox p.r.n.  8. Strattera q.d.   PHYSICAL EXAMINATION:  VITAL SIGNS:  Weight 213 pounds, blood pressure  180/70, respirations 16, pulse 80.  GENERAL:  She is tired appearing but is in no distress.  LUNGS:  Clear.  HEART:  Regular, no murmur.  EXTREMITIES:  Lower extremities with trace edema.  MUSCULOSKELETAL:  The hands, wrists, elbows, and shoulders have no swelling  but are tender with movement and there is complaint of soreness.  Trigger  points everywhere she was palpated were quite tender.  The right knee is  cool; there is no effusion.  There is a great deal of muscle tightness and  stiffness with range of motion.  There was some tenderness as I moved  the  knee.  There was minor medial joint line tenderness.  The left knee also  moved with tenderness but seemed to be less tender.  The ankles were cool,  nonswollen, but moderately tender.   ASSESSMENT AND PLAN:  1. Insomnia/sleep apnea.  I believe this is her overriding problem, causing     a great deal of achiness all over, poor memory, and the headaches.  This     is like fibromyalgia, particularly with all the symptoms that she has and     aching all over.  However, the real diagnosis is sleep apnea.  I will ask     Dr. Shelle Iron to  evaluate her for possible other ways that she may be     treated.  I still sense that CPAP would be appropriate for her.  She is     on strong medicines for pain along with an antidepressant, anxiety     medicines, and Xanax for sleep.  I will resist adding further medicines     along this line.  2. Knee pain.  I did receive the x-rays of the right knee taken in September     2002.  There was no joint space loss, osteophytes, or effusion.  It was a     normal knee x-ray.  I also received a lumbar spine x-ray of moderate     quality.  This did show some anterior osteophytes to the lower lumbar     spine.  There were some degenerative changes to the superior lateral area     of acetabulum consistent with DJD.  I have placed her on Relafen 500 mg     one to two tablets a day.  She was cautioned that if she were having     further stomach difficulties to stop the medicine for a while.  She will     now stop the Aleve.  3. Cardiac workup.   I will see her back on a p.r.n. basis.                                                Aundra Dubin, M.D.    WWT/MEDQ  D:  02/12/2002  T:  02/12/2002  Job:  [id number]   cc:   Madelin Rear. Sherwood Gambler, M.D.  [address]  [place]  [place] [address]  Fax: [phone]   Marcelyn Bruins, M.D. [facility]
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
## 2010-09-15 ENCOUNTER — Other Ambulatory Visit (HOSPITAL_COMMUNITY): Payer: Self-pay | Admitting: Internal Medicine

## 2010-09-15 ENCOUNTER — Ambulatory Visit (HOSPITAL_COMMUNITY)
Admission: RE | Admit: 2010-09-15 | Discharge: 2010-09-15 | Disposition: A | Discharge status: Home / Self Care | Payer: Medicare Other | Source: Ambulatory Visit | Attending: Internal Medicine | Admitting: Internal Medicine

## 2010-09-15 DIAGNOSIS — G8929 Other chronic pain: Secondary | ICD-10-CM

## 2010-09-15 DIAGNOSIS — M546 Pain in thoracic spine: Secondary | ICD-10-CM | POA: Insufficient documentation

## 2010-09-15 DIAGNOSIS — M545 Low back pain, unspecified: Secondary | ICD-10-CM | POA: Insufficient documentation

## 2010-09-15 DIAGNOSIS — M542 Cervicalgia: Secondary | ICD-10-CM | POA: Insufficient documentation

## 2010-09-15 DIAGNOSIS — M549 Dorsalgia, unspecified: Secondary | ICD-10-CM

## 2010-09-15 DIAGNOSIS — M5137 Other intervertebral disc degeneration, lumbosacral region: Secondary | ICD-10-CM | POA: Insufficient documentation

## 2010-09-15 DIAGNOSIS — M51379 Other intervertebral disc degeneration, lumbosacral region without mention of lumbar back pain or lower extremity pain: Secondary | ICD-10-CM | POA: Insufficient documentation

## 2010-09-22 ENCOUNTER — Other Ambulatory Visit (HOSPITAL_COMMUNITY): Payer: Self-pay | Admitting: Internal Medicine

## 2010-09-22 DIAGNOSIS — M549 Dorsalgia, unspecified: Secondary | ICD-10-CM

## 2010-09-27 ENCOUNTER — Other Ambulatory Visit (HOSPITAL_COMMUNITY): Payer: Medicare Other

## 2010-09-27 ENCOUNTER — Ambulatory Visit (HOSPITAL_COMMUNITY)
Admission: RE | Admit: 2010-09-27 | Discharge: 2010-09-27 | Disposition: A | Discharge status: Home / Self Care | Payer: Medicare Other | Source: Ambulatory Visit | Attending: Internal Medicine | Admitting: Internal Medicine

## 2010-09-27 DIAGNOSIS — M25559 Pain in unspecified hip: Secondary | ICD-10-CM | POA: Insufficient documentation

## 2010-09-27 DIAGNOSIS — M5126 Other intervertebral disc displacement, lumbar region: Secondary | ICD-10-CM | POA: Insufficient documentation

## 2010-09-27 DIAGNOSIS — M79609 Pain in unspecified limb: Secondary | ICD-10-CM | POA: Insufficient documentation

## 2010-09-27 DIAGNOSIS — M545 Low back pain, unspecified: Secondary | ICD-10-CM | POA: Insufficient documentation

## 2010-09-27 DIAGNOSIS — M549 Dorsalgia, unspecified: Secondary | ICD-10-CM

## 2010-09-27 DIAGNOSIS — M5137 Other intervertebral disc degeneration, lumbosacral region: Secondary | ICD-10-CM | POA: Insufficient documentation

## 2010-09-27 DIAGNOSIS — M51379 Other intervertebral disc degeneration, lumbosacral region without mention of lumbar back pain or lower extremity pain: Secondary | ICD-10-CM | POA: Insufficient documentation

## 2010-12-20 LAB — BASIC METABOLIC PANEL
BUN: 6
Chloride: 104
GFR calc non Af Amer: >60
Glucose, Bld: 121 — ABNORMAL HIGH
Potassium: 4.1
Sodium: 137

## 2010-12-20 LAB — DIFFERENTIAL
Eosinophils Absolute: 0
Eosinophils Relative: 0
Lymphocytes Relative: 19
Lymphs Abs: 1.7
Monocytes Absolute: 0.3

## 2010-12-20 LAB — POCT CARDIAC MARKERS: Troponin i, poc: <0.05

## 2010-12-20 LAB — CBC
HCT: 37.2
Hemoglobin: 11.9 — ABNORMAL LOW
MCV: 83.5
WBC: 8.9

## 2010-12-20 LAB — TROPONIN I: Troponin I: 0.02

## 2010-12-20 LAB — CK TOTAL AND CKMB (NOT AT ARMC)
CK, MB: 0.5
Relative Index: INVALID
Total CK: 36

## 2010-12-20 LAB — AMYLASE: Amylase: 27

## 2011-01-11 ENCOUNTER — Encounter: Payer: Self-pay | Admitting: Cardiovascular Disease

## 2011-01-12 ENCOUNTER — Encounter: Payer: Self-pay | Admitting: Cardiology

## 2011-01-15 ENCOUNTER — Ambulatory Visit (INDEPENDENT_AMBULATORY_CARE_PROVIDER_SITE_OTHER): Payer: Medicare Other | Admitting: Cardiovascular Disease

## 2011-01-15 ENCOUNTER — Encounter: Payer: Self-pay | Admitting: Cardiovascular Disease

## 2011-01-15 DIAGNOSIS — R0600 Dyspnea, unspecified: Secondary | ICD-10-CM | POA: Insufficient documentation

## 2011-01-15 DIAGNOSIS — R0602 Shortness of breath: Secondary | ICD-10-CM

## 2011-01-15 DIAGNOSIS — I10 Essential (primary) hypertension: Secondary | ICD-10-CM

## 2011-01-15 DIAGNOSIS — R002 Palpitations: Secondary | ICD-10-CM

## 2011-01-15 DIAGNOSIS — R0989 Other specified symptoms and signs involving the circulatory and respiratory systems: Secondary | ICD-10-CM

## 2011-01-15 MED ORDER — AMLODIPINE BESYLATE 5 MG PO TABS: 5.0000 mg | ORAL_TABLET | Freq: Two times a day (BID) | ORAL | Status: DC

## 2011-01-15 NOTE — Patient Instructions (Signed)
Follow up as scheduled. Your physician has requested that you have an echocardiogram. Echocardiography is a painless test that uses sound waves to create images of your heart. It provides your doctor with information about the size and shape of your heart and how well your heart's chambers and valves are working. This procedure takes approximately one hour. There are no restrictions for this procedure. Your physician has recommended that you wear a holter monitor. Holter monitors are medical devices that record the heart's electrical activity. Doctors most often use these monitors to diagnose arrhythmias. Arrhythmias are problems with the speed or rhythm of the heartbeat. The monitor is a small, portable device. You can wear one while you do your normal daily activities. This is usually used to diagnose what is causing palpitations/syncope (passing out). Your physician recommends that you continue on your current medications as directed. Please refer to the Current Medication list given to you today. If the results of your test are normal or stable, you will receive a letter. If they are abnormal, the nurse will contact you by phone.

## 2011-01-15 NOTE — Assessment & Plan Note (Signed)
The patient has dyspnea without chest pain. She supposedly have history of mitral regurgitation due to use of Phen-Fen in the past. I will obtain an echocardiogram for evaluation.

## 2011-01-15 NOTE — Assessment & Plan Note (Signed)
The patient's blood pressure is reasonably controlled. Based on the results of the Holter monitor, we'll decide if she needs a small dose of nonselective beta blocker versus another agent such as an ACE inhibitor or an ARB.

## 2011-01-15 NOTE — Assessment & Plan Note (Signed)
The patient had recent sinus bradycardia which was likely due to mild hypothyroidism as well as the use of metoprolol. Her heart rate currently is 60 beats per minute. She is unfortunately having more palpitations. Thus, I recommend a 48 hours Holter monitor for further evaluation. I will consider starting her on a small dose carvedilol if needed upon followup.
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
## 2011-01-18 ENCOUNTER — Other Ambulatory Visit (INDEPENDENT_AMBULATORY_CARE_PROVIDER_SITE_OTHER): Payer: Medicare Other | Admitting: *Deleted

## 2011-01-18 DIAGNOSIS — R0602 Shortness of breath: Secondary | ICD-10-CM

## 2011-01-18 DIAGNOSIS — I10 Essential (primary) hypertension: Secondary | ICD-10-CM

## 2011-01-23 ENCOUNTER — Telehealth: Payer: Self-pay | Admitting: *Deleted

## 2011-01-23 MED ORDER — CARVEDILOL 3.125 MG PO TABS: 3.1250 mg | ORAL_TABLET | Freq: Two times a day (BID) | ORAL | Status: DC

## 2011-01-23 NOTE — Telephone Encounter (Signed)
Per Dr. Jari Sportsman review of Holter: Pt is having fast heartbeats during the daytime. Start Coreg 3.125 mg two times a day. Follow up as planned.  Pt notified of results and verbalized understanding. RX for Coreg sent to [facility].

## 2011-01-23 NOTE — Telephone Encounter (Signed)
Message copied by Arlyss Gandy on Tue Jan 23, 2011  4:18 PM ------      [name]      Created: Mon Jan 22, 2011 12:40 PM       Inform patient that echo showed that her heart function was slightly below normal. I want her to follow up after her Holter monitor is done.

## 2011-01-30 ENCOUNTER — Encounter: Payer: Self-pay | Admitting: Cardiovascular Disease

## 2011-02-01 ENCOUNTER — Encounter: Payer: Self-pay | Admitting: Cardiovascular Disease

## 2011-02-01 ENCOUNTER — Ambulatory Visit (INDEPENDENT_AMBULATORY_CARE_PROVIDER_SITE_OTHER): Payer: Medicare Other | Admitting: Cardiovascular Disease

## 2011-02-01 ENCOUNTER — Encounter: Payer: Self-pay | Admitting: *Deleted

## 2011-02-01 ENCOUNTER — Telehealth: Payer: Self-pay | Admitting: *Deleted

## 2011-02-01 DIAGNOSIS — R0600 Dyspnea, unspecified: Secondary | ICD-10-CM

## 2011-02-01 DIAGNOSIS — R0989 Other specified symptoms and signs involving the circulatory and respiratory systems: Secondary | ICD-10-CM

## 2011-02-01 DIAGNOSIS — R0602 Shortness of breath: Secondary | ICD-10-CM

## 2011-02-01 DIAGNOSIS — I10 Essential (primary) hypertension: Secondary | ICD-10-CM

## 2011-02-01 DIAGNOSIS — R0609 Other forms of dyspnea: Secondary | ICD-10-CM

## 2011-02-01 DIAGNOSIS — R002 Palpitations: Secondary | ICD-10-CM
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
## 2011-02-01 NOTE — Assessment & Plan Note (Signed)
Her Holter monitor showed no serious arrhythmia. She did have a few PACs and PVCs as well as sinus tachycardia. Her symptoms are currently controlled with small dose Coreg. She has been monitoring her blood pressure and heart rate and there has been no further episodes of bradycardia. No further workup for this as needed.

## 2011-02-01 NOTE — Assessment & Plan Note (Signed)
Her echo showed borderline reduced LV systolic function with anterior wall hypokinesis. Thus, I will request a pharmacologic nuclear stress test for further evaluation. The patient denies any chest pain. If her stress test comes back normal then she can followup with [place] as needed.

## 2011-02-01 NOTE — Telephone Encounter (Signed)
LEXISCAN CARDIOLITE scheduled for 02-08-2011 @ [facility] Checking percert for Medicaid

## 2011-02-01 NOTE — Assessment & Plan Note (Signed)
Her blood pressure is well controlled. Continue current medications. 

## 2011-02-01 NOTE — Patient Instructions (Signed)
Your follow up will be based on your test results. Your physician has requested that you have a lexiscan cardiolite. For further information please visit [email or web address] Please follow instruction sheet, as given. Your physician recommends that you continue on your current medications as directed. Please refer to the Current Medication list given to you today. If the results of your test are normal or stable, you will receive a letter. If they are abnormal, the nurse will contact you by phone.

## 2011-02-01 NOTE — Telephone Encounter (Signed)
No precert required 

## 2011-02-08 DIAGNOSIS — R072 Precordial pain: Secondary | ICD-10-CM

## 2011-02-28 ENCOUNTER — Telehealth: Payer: Self-pay | Admitting: *Deleted

## 2011-03-01 ENCOUNTER — Encounter: Payer: Self-pay | Admitting: Cardiovascular Disease

## 2011-03-01 NOTE — Telephone Encounter (Signed)
Left message on voicemail of patient to call office to give name and number for GI in [place].

## 2011-03-01 NOTE — Telephone Encounter (Signed)
Mrs. Purk called today stating she washed her telephone # . You can call her at the following # [phone]. (son Trey Paula)

## 2011-03-01 NOTE — Telephone Encounter (Signed)
Patient informed. [facility] in [facility].

## 2011-03-01 NOTE — Telephone Encounter (Signed)
She can undergo colonoscopy at low risk from a cardiac standpoint. (fax tham last office note and stress test)

## 2011-03-02 NOTE — Telephone Encounter (Signed)
Office note,telephone note, and stress test faxed to [facility] at [facility] [phone] fax # phone # [phone]

## 2011-03-08 ENCOUNTER — Telehealth: Payer: Self-pay | Admitting: *Deleted

## 2011-03-08 NOTE — Telephone Encounter (Signed)
Pt states Dr. Kirke Corin told her to call the office if HR was <50. She states when she checked her BP this evening her HR was 49. She denies any symptoms other than occasional weird feeling in her chest that she thinks is her nerves. She denies lightheadedness or dizziness. This is the only time she has found her HR to be <50. She is notified to check BP 1-2x/day and notify the office if there is a pattern of HR <50. Pt verbalized understanding.

## 2011-04-02 DIAGNOSIS — I10 Essential (primary) hypertension: Secondary | ICD-10-CM | POA: Diagnosis not present

## 2011-04-02 DIAGNOSIS — E669 Obesity, unspecified: Secondary | ICD-10-CM | POA: Diagnosis not present

## 2011-04-02 DIAGNOSIS — M549 Dorsalgia, unspecified: Secondary | ICD-10-CM | POA: Diagnosis not present

## 2011-04-02 DIAGNOSIS — D510 Vitamin B12 deficiency anemia due to intrinsic factor deficiency: Secondary | ICD-10-CM | POA: Diagnosis not present

## 2011-04-02 DIAGNOSIS — IMO0001 Reserved for inherently not codable concepts without codable children: Secondary | ICD-10-CM | POA: Diagnosis not present

## 2011-04-02 DIAGNOSIS — G8929 Other chronic pain: Secondary | ICD-10-CM | POA: Diagnosis not present

## 2011-04-02 DIAGNOSIS — G609 Hereditary and idiopathic neuropathy, unspecified: Secondary | ICD-10-CM | POA: Diagnosis not present

## 2011-04-04 DIAGNOSIS — IMO0001 Reserved for inherently not codable concepts without codable children: Secondary | ICD-10-CM | POA: Diagnosis not present

## 2011-04-04 DIAGNOSIS — M47817 Spondylosis without myelopathy or radiculopathy, lumbosacral region: Secondary | ICD-10-CM | POA: Diagnosis not present

## 2011-04-06 DIAGNOSIS — M47817 Spondylosis without myelopathy or radiculopathy, lumbosacral region: Secondary | ICD-10-CM | POA: Diagnosis not present

## 2011-04-06 DIAGNOSIS — IMO0001 Reserved for inherently not codable concepts without codable children: Secondary | ICD-10-CM | POA: Diagnosis not present

## 2011-04-09 DIAGNOSIS — M549 Dorsalgia, unspecified: Secondary | ICD-10-CM | POA: Diagnosis not present

## 2011-04-09 DIAGNOSIS — IMO0001 Reserved for inherently not codable concepts without codable children: Secondary | ICD-10-CM | POA: Diagnosis not present

## 2011-04-09 DIAGNOSIS — D510 Vitamin B12 deficiency anemia due to intrinsic factor deficiency: Secondary | ICD-10-CM | POA: Diagnosis not present

## 2011-04-09 DIAGNOSIS — G609 Hereditary and idiopathic neuropathy, unspecified: Secondary | ICD-10-CM | POA: Diagnosis not present

## 2011-04-09 DIAGNOSIS — I10 Essential (primary) hypertension: Secondary | ICD-10-CM | POA: Diagnosis not present

## 2011-04-09 DIAGNOSIS — G8929 Other chronic pain: Secondary | ICD-10-CM | POA: Diagnosis not present

## 2011-04-10 DIAGNOSIS — M47817 Spondylosis without myelopathy or radiculopathy, lumbosacral region: Secondary | ICD-10-CM | POA: Diagnosis not present

## 2011-04-10 DIAGNOSIS — IMO0001 Reserved for inherently not codable concepts without codable children: Secondary | ICD-10-CM | POA: Diagnosis not present

## 2011-04-12 DIAGNOSIS — M47817 Spondylosis without myelopathy or radiculopathy, lumbosacral region: Secondary | ICD-10-CM | POA: Diagnosis not present

## 2011-04-12 DIAGNOSIS — IMO0001 Reserved for inherently not codable concepts without codable children: Secondary | ICD-10-CM | POA: Diagnosis not present

## 2011-04-13 DIAGNOSIS — IMO0001 Reserved for inherently not codable concepts without codable children: Secondary | ICD-10-CM | POA: Diagnosis not present

## 2011-04-13 DIAGNOSIS — M47817 Spondylosis without myelopathy or radiculopathy, lumbosacral region: Secondary | ICD-10-CM | POA: Diagnosis not present

## 2011-04-16 DIAGNOSIS — IMO0001 Reserved for inherently not codable concepts without codable children: Secondary | ICD-10-CM | POA: Diagnosis not present

## 2011-04-16 DIAGNOSIS — M47817 Spondylosis without myelopathy or radiculopathy, lumbosacral region: Secondary | ICD-10-CM | POA: Diagnosis not present

## 2011-04-18 DIAGNOSIS — IMO0001 Reserved for inherently not codable concepts without codable children: Secondary | ICD-10-CM | POA: Diagnosis not present

## 2011-04-18 DIAGNOSIS — M47817 Spondylosis without myelopathy or radiculopathy, lumbosacral region: Secondary | ICD-10-CM | POA: Diagnosis not present

## 2011-04-24 DIAGNOSIS — M47817 Spondylosis without myelopathy or radiculopathy, lumbosacral region: Secondary | ICD-10-CM | POA: Diagnosis not present

## 2011-04-24 DIAGNOSIS — IMO0001 Reserved for inherently not codable concepts without codable children: Secondary | ICD-10-CM | POA: Diagnosis not present

## 2011-04-26 DIAGNOSIS — IMO0001 Reserved for inherently not codable concepts without codable children: Secondary | ICD-10-CM | POA: Diagnosis not present

## 2011-04-26 DIAGNOSIS — M47817 Spondylosis without myelopathy or radiculopathy, lumbosacral region: Secondary | ICD-10-CM | POA: Diagnosis not present

## 2011-05-08 DIAGNOSIS — Z5181 Encounter for therapeutic drug level monitoring: Secondary | ICD-10-CM | POA: Diagnosis not present

## 2011-05-08 DIAGNOSIS — M545 Low back pain: Secondary | ICD-10-CM | POA: Diagnosis not present

## 2011-05-08 DIAGNOSIS — M159 Polyosteoarthritis, unspecified: Secondary | ICD-10-CM | POA: Diagnosis not present

## 2011-05-08 DIAGNOSIS — Z79899 Other long term (current) drug therapy: Secondary | ICD-10-CM | POA: Diagnosis not present

## 2011-05-09 DIAGNOSIS — I10 Essential (primary) hypertension: Secondary | ICD-10-CM | POA: Diagnosis not present

## 2011-05-09 DIAGNOSIS — G8929 Other chronic pain: Secondary | ICD-10-CM | POA: Diagnosis not present

## 2011-05-09 DIAGNOSIS — D510 Vitamin B12 deficiency anemia due to intrinsic factor deficiency: Secondary | ICD-10-CM | POA: Diagnosis not present

## 2011-05-09 DIAGNOSIS — G609 Hereditary and idiopathic neuropathy, unspecified: Secondary | ICD-10-CM | POA: Diagnosis not present

## 2011-05-09 DIAGNOSIS — M549 Dorsalgia, unspecified: Secondary | ICD-10-CM | POA: Diagnosis not present

## 2011-05-09 DIAGNOSIS — IMO0001 Reserved for inherently not codable concepts without codable children: Secondary | ICD-10-CM | POA: Diagnosis not present

## 2011-05-12 DIAGNOSIS — Z79899 Other long term (current) drug therapy: Secondary | ICD-10-CM | POA: Diagnosis not present

## 2011-05-12 DIAGNOSIS — I10 Essential (primary) hypertension: Secondary | ICD-10-CM | POA: Diagnosis not present

## 2011-05-12 DIAGNOSIS — M1710 Unilateral primary osteoarthritis, unspecified knee: Secondary | ICD-10-CM | POA: Diagnosis not present

## 2011-05-12 DIAGNOSIS — IMO0001 Reserved for inherently not codable concepts without codable children: Secondary | ICD-10-CM | POA: Diagnosis not present

## 2011-05-12 DIAGNOSIS — M25569 Pain in unspecified knee: Secondary | ICD-10-CM | POA: Diagnosis not present

## 2011-05-21 DIAGNOSIS — E669 Obesity, unspecified: Secondary | ICD-10-CM | POA: Diagnosis not present

## 2011-05-21 DIAGNOSIS — F411 Generalized anxiety disorder: Secondary | ICD-10-CM | POA: Diagnosis not present

## 2011-05-21 DIAGNOSIS — Z6835 Body mass index (BMI) 35.0-35.9, adult: Secondary | ICD-10-CM | POA: Diagnosis not present

## 2011-05-21 DIAGNOSIS — G8929 Other chronic pain: Secondary | ICD-10-CM | POA: Diagnosis not present

## 2011-05-22 ENCOUNTER — Other Ambulatory Visit (HOSPITAL_COMMUNITY): Payer: Self-pay | Admitting: Internal Medicine

## 2011-05-22 DIAGNOSIS — Z139 Encounter for screening, unspecified: Secondary | ICD-10-CM

## 2011-05-24 DIAGNOSIS — K5900 Constipation, unspecified: Secondary | ICD-10-CM | POA: Diagnosis not present

## 2011-05-24 DIAGNOSIS — Z86010 Personal history of colonic polyps: Secondary | ICD-10-CM | POA: Diagnosis not present

## 2011-05-24 DIAGNOSIS — R109 Unspecified abdominal pain: Secondary | ICD-10-CM | POA: Diagnosis not present

## 2011-05-24 DIAGNOSIS — R110 Nausea: Secondary | ICD-10-CM | POA: Diagnosis not present

## 2011-05-28 DIAGNOSIS — I10 Essential (primary) hypertension: Secondary | ICD-10-CM | POA: Diagnosis not present

## 2011-05-28 DIAGNOSIS — G609 Hereditary and idiopathic neuropathy, unspecified: Secondary | ICD-10-CM | POA: Diagnosis not present

## 2011-05-28 DIAGNOSIS — G8929 Other chronic pain: Secondary | ICD-10-CM | POA: Diagnosis not present

## 2011-05-28 DIAGNOSIS — M549 Dorsalgia, unspecified: Secondary | ICD-10-CM | POA: Diagnosis not present

## 2011-05-28 DIAGNOSIS — IMO0001 Reserved for inherently not codable concepts without codable children: Secondary | ICD-10-CM | POA: Diagnosis not present

## 2011-05-28 DIAGNOSIS — D510 Vitamin B12 deficiency anemia due to intrinsic factor deficiency: Secondary | ICD-10-CM | POA: Diagnosis not present

## 2011-06-01 DIAGNOSIS — D510 Vitamin B12 deficiency anemia due to intrinsic factor deficiency: Secondary | ICD-10-CM | POA: Diagnosis not present

## 2011-06-01 DIAGNOSIS — M549 Dorsalgia, unspecified: Secondary | ICD-10-CM | POA: Diagnosis not present

## 2011-06-01 DIAGNOSIS — G609 Hereditary and idiopathic neuropathy, unspecified: Secondary | ICD-10-CM | POA: Diagnosis not present

## 2011-06-01 DIAGNOSIS — G8929 Other chronic pain: Secondary | ICD-10-CM | POA: Diagnosis not present

## 2011-06-01 DIAGNOSIS — IMO0001 Reserved for inherently not codable concepts without codable children: Secondary | ICD-10-CM | POA: Diagnosis not present

## 2011-06-01 DIAGNOSIS — I10 Essential (primary) hypertension: Secondary | ICD-10-CM | POA: Diagnosis not present

## 2011-06-05 DIAGNOSIS — E039 Hypothyroidism, unspecified: Secondary | ICD-10-CM | POA: Diagnosis not present

## 2011-06-05 DIAGNOSIS — Z888 Allergy status to other drugs, medicaments and biological substances status: Secondary | ICD-10-CM | POA: Diagnosis not present

## 2011-06-05 DIAGNOSIS — Z79899 Other long term (current) drug therapy: Secondary | ICD-10-CM | POA: Diagnosis not present

## 2011-06-05 DIAGNOSIS — K620 Anal polyp: Secondary | ICD-10-CM | POA: Diagnosis not present

## 2011-06-05 DIAGNOSIS — I10 Essential (primary) hypertension: Secondary | ICD-10-CM | POA: Diagnosis not present

## 2011-06-05 DIAGNOSIS — K5909 Other constipation: Secondary | ICD-10-CM | POA: Diagnosis not present

## 2011-06-05 DIAGNOSIS — K219 Gastro-esophageal reflux disease without esophagitis: Secondary | ICD-10-CM | POA: Diagnosis not present

## 2011-06-05 DIAGNOSIS — Z882 Allergy status to sulfonamides status: Secondary | ICD-10-CM | POA: Diagnosis not present

## 2011-06-05 DIAGNOSIS — Z1211 Encounter for screening for malignant neoplasm of colon: Secondary | ICD-10-CM | POA: Diagnosis not present

## 2011-06-05 DIAGNOSIS — K621 Rectal polyp: Secondary | ICD-10-CM | POA: Diagnosis not present

## 2011-06-05 DIAGNOSIS — K5900 Constipation, unspecified: Secondary | ICD-10-CM | POA: Diagnosis not present

## 2011-06-05 DIAGNOSIS — K648 Other hemorrhoids: Secondary | ICD-10-CM | POA: Diagnosis not present

## 2011-06-07 DIAGNOSIS — M549 Dorsalgia, unspecified: Secondary | ICD-10-CM | POA: Diagnosis not present

## 2011-06-07 DIAGNOSIS — G609 Hereditary and idiopathic neuropathy, unspecified: Secondary | ICD-10-CM | POA: Diagnosis not present

## 2011-06-07 DIAGNOSIS — D510 Vitamin B12 deficiency anemia due to intrinsic factor deficiency: Secondary | ICD-10-CM | POA: Diagnosis not present

## 2011-06-07 DIAGNOSIS — G8929 Other chronic pain: Secondary | ICD-10-CM | POA: Diagnosis not present

## 2011-06-07 DIAGNOSIS — IMO0001 Reserved for inherently not codable concepts without codable children: Secondary | ICD-10-CM | POA: Diagnosis not present

## 2011-06-07 DIAGNOSIS — I10 Essential (primary) hypertension: Secondary | ICD-10-CM | POA: Diagnosis not present

## 2011-06-22 DIAGNOSIS — E785 Hyperlipidemia, unspecified: Secondary | ICD-10-CM | POA: Diagnosis not present

## 2011-06-22 DIAGNOSIS — E039 Hypothyroidism, unspecified: Secondary | ICD-10-CM | POA: Diagnosis not present

## 2011-06-22 DIAGNOSIS — Z6834 Body mass index (BMI) 34.0-34.9, adult: Secondary | ICD-10-CM | POA: Diagnosis not present

## 2011-06-22 DIAGNOSIS — M159 Polyosteoarthritis, unspecified: Secondary | ICD-10-CM | POA: Diagnosis not present

## 2011-06-28 ENCOUNTER — Ambulatory Visit: Payer: Medicare Other | Admitting: Orthopedic Surgery

## 2011-06-28 ENCOUNTER — Ambulatory Visit (INDEPENDENT_AMBULATORY_CARE_PROVIDER_SITE_OTHER): Payer: Medicare Other | Admitting: Orthopedic Surgery

## 2011-06-28 ENCOUNTER — Encounter: Payer: Self-pay | Admitting: Orthopedic Surgery

## 2011-06-28 VITALS — BP 108/58 | Ht 64.0 in | Wt 195.0 lb

## 2011-06-28 DIAGNOSIS — M23329 Other meniscus derangements, posterior horn of medial meniscus, unspecified knee: Secondary | ICD-10-CM | POA: Insufficient documentation

## 2011-06-28 DIAGNOSIS — M25561 Pain in right knee: Secondary | ICD-10-CM

## 2011-06-28 DIAGNOSIS — S83501A Sprain of unspecified cruciate ligament of right knee, initial encounter: Secondary | ICD-10-CM | POA: Insufficient documentation

## 2011-06-28 DIAGNOSIS — M25569 Pain in unspecified knee: Secondary | ICD-10-CM

## 2011-06-28 DIAGNOSIS — S83509A Sprain of unspecified cruciate ligament of unspecified knee, initial encounter: Secondary | ICD-10-CM

## 2011-06-28 NOTE — Patient Instructions (Signed)
We will schedule an MRI for you
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
## 2011-07-02 ENCOUNTER — Telehealth: Payer: Self-pay | Admitting: Radiology

## 2011-07-02 NOTE — Telephone Encounter (Signed)
I called to give the patient her MRI appointment at [facility] on 07-04-11 at 3:45. Patient has Medicare, no precert is needed. Patient will follow up back here in our office for her results.

## 2011-07-04 ENCOUNTER — Ambulatory Visit (HOSPITAL_COMMUNITY)
Admission: RE | Admit: 2011-07-04 | Discharge: 2011-07-04 | Disposition: A | Discharge status: Home / Self Care | Payer: Medicare Other | Source: Ambulatory Visit | Attending: Orthopedic Surgery | Admitting: Orthopedic Surgery

## 2011-07-04 DIAGNOSIS — M25469 Effusion, unspecified knee: Secondary | ICD-10-CM | POA: Diagnosis not present

## 2011-07-04 DIAGNOSIS — X58XXXA Exposure to other specified factors, initial encounter: Secondary | ICD-10-CM | POA: Insufficient documentation

## 2011-07-04 DIAGNOSIS — M25561 Pain in right knee: Secondary | ICD-10-CM

## 2011-07-04 DIAGNOSIS — M25569 Pain in unspecified knee: Secondary | ICD-10-CM | POA: Insufficient documentation

## 2011-07-04 DIAGNOSIS — IMO0002 Reserved for concepts with insufficient information to code with codable children: Secondary | ICD-10-CM | POA: Diagnosis not present

## 2011-07-09 DIAGNOSIS — D510 Vitamin B12 deficiency anemia due to intrinsic factor deficiency: Secondary | ICD-10-CM | POA: Diagnosis not present

## 2011-07-09 DIAGNOSIS — G609 Hereditary and idiopathic neuropathy, unspecified: Secondary | ICD-10-CM | POA: Diagnosis not present

## 2011-07-09 DIAGNOSIS — Z113 Encounter for screening for infections with a predominantly sexual mode of transmission: Secondary | ICD-10-CM | POA: Diagnosis not present

## 2011-07-09 DIAGNOSIS — M549 Dorsalgia, unspecified: Secondary | ICD-10-CM | POA: Diagnosis not present

## 2011-07-09 DIAGNOSIS — G8929 Other chronic pain: Secondary | ICD-10-CM | POA: Diagnosis not present

## 2011-07-09 DIAGNOSIS — IMO0001 Reserved for inherently not codable concepts without codable children: Secondary | ICD-10-CM | POA: Diagnosis not present

## 2011-07-09 DIAGNOSIS — I10 Essential (primary) hypertension: Secondary | ICD-10-CM | POA: Diagnosis not present

## 2011-07-18 ENCOUNTER — Ambulatory Visit (INDEPENDENT_AMBULATORY_CARE_PROVIDER_SITE_OTHER): Payer: Medicare Other | Admitting: Orthopedic Surgery

## 2011-07-18 ENCOUNTER — Encounter: Payer: Self-pay | Admitting: Orthopedic Surgery

## 2011-07-18 VITALS — BP 110/70 | Ht 64.0 in | Wt 195.0 lb

## 2011-07-18 DIAGNOSIS — M23329 Other meniscus derangements, posterior horn of medial meniscus, unspecified knee: Secondary | ICD-10-CM

## 2011-07-18 NOTE — Patient Instructions (Signed)
Follow as needed

## 2011-07-18 NOTE — Progress Notes (Signed)
Patient ID: Margaret Cowan, female   DOB: 1950/10/27, 61 y.o.   MRN: [id number] Chief Complaint  Patient presents with  . Results    mri results   RIGHT knee. MRI  Patient presented for a steroid Dosepak continues to have RIGHT hip and lower extremity radicular pain. She is followed by a pain clinic and her primary care physician. She showed me an MRI from June of 2012, which showed T11 disc protrusion and degenerative disease with spinal stenosis from T12-L5,  She's had previously a size, which did not help.  Her MRI shows a nondisplaced medial meniscal tear with arthritis.  Review of systems persistent back pain  I suspect she has had symptoms from the meniscal tear, which have subsided.  Recommend continued exercise to lose weight. No surgery needed at this time. Follow up as needed  Mri reviewed    Medical decision making interpret MRI imaging in review MRI report completed. Overall risk recommendations therapy at home. Establish problem improved.

## 2011-07-20 DIAGNOSIS — Z1231 Encounter for screening mammogram for malignant neoplasm of breast: Secondary | ICD-10-CM | POA: Diagnosis not present

## 2011-07-30 DIAGNOSIS — D510 Vitamin B12 deficiency anemia due to intrinsic factor deficiency: Secondary | ICD-10-CM | POA: Diagnosis not present

## 2011-07-30 DIAGNOSIS — IMO0001 Reserved for inherently not codable concepts without codable children: Secondary | ICD-10-CM | POA: Diagnosis not present

## 2011-07-30 DIAGNOSIS — G8929 Other chronic pain: Secondary | ICD-10-CM | POA: Diagnosis not present

## 2011-07-30 DIAGNOSIS — M549 Dorsalgia, unspecified: Secondary | ICD-10-CM | POA: Diagnosis not present

## 2011-07-30 DIAGNOSIS — I10 Essential (primary) hypertension: Secondary | ICD-10-CM | POA: Diagnosis not present

## 2011-07-30 DIAGNOSIS — G609 Hereditary and idiopathic neuropathy, unspecified: Secondary | ICD-10-CM | POA: Diagnosis not present

## 2011-07-31 DIAGNOSIS — I10 Essential (primary) hypertension: Secondary | ICD-10-CM | POA: Diagnosis not present

## 2011-07-31 DIAGNOSIS — G894 Chronic pain syndrome: Secondary | ICD-10-CM | POA: Diagnosis not present

## 2011-07-31 DIAGNOSIS — M549 Dorsalgia, unspecified: Secondary | ICD-10-CM | POA: Diagnosis not present

## 2011-07-31 DIAGNOSIS — M25569 Pain in unspecified knee: Secondary | ICD-10-CM | POA: Diagnosis not present

## 2011-07-31 DIAGNOSIS — M159 Polyosteoarthritis, unspecified: Secondary | ICD-10-CM | POA: Diagnosis not present

## 2011-07-31 DIAGNOSIS — G8929 Other chronic pain: Secondary | ICD-10-CM | POA: Diagnosis not present

## 2011-07-31 DIAGNOSIS — IMO0001 Reserved for inherently not codable concepts without codable children: Secondary | ICD-10-CM | POA: Diagnosis not present

## 2011-07-31 DIAGNOSIS — D510 Vitamin B12 deficiency anemia due to intrinsic factor deficiency: Secondary | ICD-10-CM | POA: Diagnosis not present

## 2011-07-31 DIAGNOSIS — M545 Low back pain: Secondary | ICD-10-CM | POA: Diagnosis not present

## 2011-07-31 DIAGNOSIS — G609 Hereditary and idiopathic neuropathy, unspecified: Secondary | ICD-10-CM | POA: Diagnosis not present

## 2011-08-04 DIAGNOSIS — I10 Essential (primary) hypertension: Secondary | ICD-10-CM | POA: Diagnosis not present

## 2011-08-04 DIAGNOSIS — M129 Arthropathy, unspecified: Secondary | ICD-10-CM | POA: Diagnosis not present

## 2011-08-04 DIAGNOSIS — Z79899 Other long term (current) drug therapy: Secondary | ICD-10-CM | POA: Diagnosis not present

## 2011-08-04 DIAGNOSIS — M25579 Pain in unspecified ankle and joints of unspecified foot: Secondary | ICD-10-CM | POA: Diagnosis not present

## 2011-08-10 DIAGNOSIS — M109 Gout, unspecified: Secondary | ICD-10-CM | POA: Diagnosis not present

## 2011-08-10 DIAGNOSIS — E669 Obesity, unspecified: Secondary | ICD-10-CM | POA: Diagnosis not present

## 2011-08-17 ENCOUNTER — Other Ambulatory Visit: Payer: Self-pay | Admitting: Cardiovascular Disease

## 2011-08-17 NOTE — Telephone Encounter (Signed)
..   Requested Prescriptions   Signed Prescriptions Disp Refills  . NORVASC 5 MG tablet 60 each 6    Sig: TAKE (1) TABLET TWICE DAILY.    Authorizing Provider: Lorine Bears A    Ordering User: Lacie Scotts  . COREG 3.125 MG tablet 60 each 6    Sig: TAKE (1) TABLET TWICE DAILY.    Authorizing Provider: Lorine Bears A    Ordering User: Lacie Scotts

## 2011-09-27 DIAGNOSIS — K5900 Constipation, unspecified: Secondary | ICD-10-CM | POA: Diagnosis not present

## 2011-09-28 DIAGNOSIS — M549 Dorsalgia, unspecified: Secondary | ICD-10-CM | POA: Diagnosis not present

## 2011-09-28 DIAGNOSIS — Z79899 Other long term (current) drug therapy: Secondary | ICD-10-CM | POA: Diagnosis not present

## 2011-09-28 DIAGNOSIS — IMO0002 Reserved for concepts with insufficient information to code with codable children: Secondary | ICD-10-CM | POA: Diagnosis not present

## 2011-09-28 DIAGNOSIS — M5030 Other cervical disc degeneration, unspecified cervical region: Secondary | ICD-10-CM | POA: Diagnosis not present

## 2011-09-28 DIAGNOSIS — G8929 Other chronic pain: Secondary | ICD-10-CM | POA: Diagnosis not present

## 2011-09-28 DIAGNOSIS — M8440XA Pathological fracture, unspecified site, initial encounter for fracture: Secondary | ICD-10-CM | POA: Diagnosis not present

## 2011-09-29 DIAGNOSIS — M549 Dorsalgia, unspecified: Secondary | ICD-10-CM | POA: Diagnosis not present

## 2011-09-29 DIAGNOSIS — D510 Vitamin B12 deficiency anemia due to intrinsic factor deficiency: Secondary | ICD-10-CM | POA: Diagnosis not present

## 2011-09-29 DIAGNOSIS — G609 Hereditary and idiopathic neuropathy, unspecified: Secondary | ICD-10-CM | POA: Diagnosis not present

## 2011-09-29 DIAGNOSIS — IMO0001 Reserved for inherently not codable concepts without codable children: Secondary | ICD-10-CM | POA: Diagnosis not present

## 2011-09-29 DIAGNOSIS — I10 Essential (primary) hypertension: Secondary | ICD-10-CM | POA: Diagnosis not present

## 2011-09-29 DIAGNOSIS — G8929 Other chronic pain: Secondary | ICD-10-CM | POA: Diagnosis not present

## 2011-10-02 DIAGNOSIS — E669 Obesity, unspecified: Secondary | ICD-10-CM | POA: Diagnosis not present

## 2011-10-02 DIAGNOSIS — Z6834 Body mass index (BMI) 34.0-34.9, adult: Secondary | ICD-10-CM | POA: Diagnosis not present

## 2011-10-02 DIAGNOSIS — E039 Hypothyroidism, unspecified: Secondary | ICD-10-CM | POA: Diagnosis not present

## 2011-10-02 DIAGNOSIS — I10 Essential (primary) hypertension: Secondary | ICD-10-CM | POA: Diagnosis not present

## 2011-10-02 DIAGNOSIS — K219 Gastro-esophageal reflux disease without esophagitis: Secondary | ICD-10-CM | POA: Diagnosis not present

## 2011-10-05 DIAGNOSIS — G609 Hereditary and idiopathic neuropathy, unspecified: Secondary | ICD-10-CM | POA: Diagnosis not present

## 2011-10-05 DIAGNOSIS — D510 Vitamin B12 deficiency anemia due to intrinsic factor deficiency: Secondary | ICD-10-CM | POA: Diagnosis not present

## 2011-10-05 DIAGNOSIS — I10 Essential (primary) hypertension: Secondary | ICD-10-CM | POA: Diagnosis not present

## 2011-10-05 DIAGNOSIS — G8929 Other chronic pain: Secondary | ICD-10-CM | POA: Diagnosis not present

## 2011-10-05 DIAGNOSIS — M549 Dorsalgia, unspecified: Secondary | ICD-10-CM | POA: Diagnosis not present

## 2011-10-05 DIAGNOSIS — IMO0001 Reserved for inherently not codable concepts without codable children: Secondary | ICD-10-CM | POA: Diagnosis not present

## 2011-10-23 DIAGNOSIS — M1710 Unilateral primary osteoarthritis, unspecified knee: Secondary | ICD-10-CM | POA: Diagnosis not present

## 2011-10-23 DIAGNOSIS — M545 Low back pain: Secondary | ICD-10-CM | POA: Diagnosis not present

## 2011-10-24 DIAGNOSIS — Z6834 Body mass index (BMI) 34.0-34.9, adult: Secondary | ICD-10-CM | POA: Diagnosis not present

## 2011-10-24 DIAGNOSIS — I10 Essential (primary) hypertension: Secondary | ICD-10-CM | POA: Diagnosis not present

## 2011-10-24 DIAGNOSIS — M549 Dorsalgia, unspecified: Secondary | ICD-10-CM | POA: Diagnosis not present

## 2011-11-05 DIAGNOSIS — G894 Chronic pain syndrome: Secondary | ICD-10-CM | POA: Diagnosis not present

## 2011-11-05 DIAGNOSIS — M47812 Spondylosis without myelopathy or radiculopathy, cervical region: Secondary | ICD-10-CM | POA: Diagnosis not present

## 2011-11-05 DIAGNOSIS — M542 Cervicalgia: Secondary | ICD-10-CM | POA: Diagnosis not present

## 2011-11-05 DIAGNOSIS — Z79899 Other long term (current) drug therapy: Secondary | ICD-10-CM | POA: Diagnosis not present

## 2011-11-05 DIAGNOSIS — M47817 Spondylosis without myelopathy or radiculopathy, lumbosacral region: Secondary | ICD-10-CM | POA: Diagnosis not present

## 2011-11-05 DIAGNOSIS — M5030 Other cervical disc degeneration, unspecified cervical region: Secondary | ICD-10-CM | POA: Diagnosis not present

## 2011-11-05 DIAGNOSIS — G8929 Other chronic pain: Secondary | ICD-10-CM | POA: Diagnosis not present

## 2011-11-06 DIAGNOSIS — IMO0001 Reserved for inherently not codable concepts without codable children: Secondary | ICD-10-CM | POA: Diagnosis not present

## 2011-11-06 DIAGNOSIS — G8929 Other chronic pain: Secondary | ICD-10-CM | POA: Diagnosis not present

## 2011-11-06 DIAGNOSIS — M549 Dorsalgia, unspecified: Secondary | ICD-10-CM | POA: Diagnosis not present

## 2011-11-06 DIAGNOSIS — D510 Vitamin B12 deficiency anemia due to intrinsic factor deficiency: Secondary | ICD-10-CM | POA: Diagnosis not present

## 2011-11-06 DIAGNOSIS — G609 Hereditary and idiopathic neuropathy, unspecified: Secondary | ICD-10-CM | POA: Diagnosis not present

## 2011-11-06 DIAGNOSIS — I10 Essential (primary) hypertension: Secondary | ICD-10-CM | POA: Diagnosis not present

## 2011-11-07 DIAGNOSIS — Z6834 Body mass index (BMI) 34.0-34.9, adult: Secondary | ICD-10-CM | POA: Diagnosis not present

## 2011-11-07 DIAGNOSIS — I10 Essential (primary) hypertension: Secondary | ICD-10-CM | POA: Diagnosis not present

## 2011-11-07 DIAGNOSIS — K219 Gastro-esophageal reflux disease without esophagitis: Secondary | ICD-10-CM | POA: Diagnosis not present

## 2011-11-07 DIAGNOSIS — E039 Hypothyroidism, unspecified: Secondary | ICD-10-CM | POA: Diagnosis not present

## 2011-11-07 DIAGNOSIS — Z713 Dietary counseling and surveillance: Secondary | ICD-10-CM | POA: Diagnosis not present

## 2011-11-14 ENCOUNTER — Ambulatory Visit: Payer: Medicare Other | Admitting: Orthopedic Surgery

## 2011-11-27 DIAGNOSIS — IMO0001 Reserved for inherently not codable concepts without codable children: Secondary | ICD-10-CM | POA: Diagnosis not present

## 2011-11-27 DIAGNOSIS — L570 Actinic keratosis: Secondary | ICD-10-CM | POA: Diagnosis not present

## 2011-11-27 DIAGNOSIS — D485 Neoplasm of uncertain behavior of skin: Secondary | ICD-10-CM | POA: Diagnosis not present

## 2011-11-27 DIAGNOSIS — G609 Hereditary and idiopathic neuropathy, unspecified: Secondary | ICD-10-CM | POA: Diagnosis not present

## 2011-11-27 DIAGNOSIS — D510 Vitamin B12 deficiency anemia due to intrinsic factor deficiency: Secondary | ICD-10-CM | POA: Diagnosis not present

## 2011-11-27 DIAGNOSIS — D1800 Hemangioma unspecified site: Secondary | ICD-10-CM | POA: Diagnosis not present

## 2011-11-27 DIAGNOSIS — G8929 Other chronic pain: Secondary | ICD-10-CM | POA: Diagnosis not present

## 2011-11-27 DIAGNOSIS — M549 Dorsalgia, unspecified: Secondary | ICD-10-CM | POA: Diagnosis not present

## 2011-11-27 DIAGNOSIS — I10 Essential (primary) hypertension: Secondary | ICD-10-CM | POA: Diagnosis not present

## 2011-11-28 DIAGNOSIS — IMO0001 Reserved for inherently not codable concepts without codable children: Secondary | ICD-10-CM | POA: Diagnosis not present

## 2011-11-28 DIAGNOSIS — G8929 Other chronic pain: Secondary | ICD-10-CM | POA: Diagnosis not present

## 2011-11-28 DIAGNOSIS — I10 Essential (primary) hypertension: Secondary | ICD-10-CM | POA: Diagnosis not present

## 2011-11-28 DIAGNOSIS — G609 Hereditary and idiopathic neuropathy, unspecified: Secondary | ICD-10-CM | POA: Diagnosis not present

## 2011-11-28 DIAGNOSIS — D510 Vitamin B12 deficiency anemia due to intrinsic factor deficiency: Secondary | ICD-10-CM | POA: Diagnosis not present

## 2011-11-28 DIAGNOSIS — M549 Dorsalgia, unspecified: Secondary | ICD-10-CM | POA: Diagnosis not present

## 2011-11-29 DIAGNOSIS — M25559 Pain in unspecified hip: Secondary | ICD-10-CM | POA: Diagnosis not present

## 2011-11-29 DIAGNOSIS — M25569 Pain in unspecified knee: Secondary | ICD-10-CM | POA: Diagnosis not present

## 2011-11-29 DIAGNOSIS — M23329 Other meniscus derangements, posterior horn of medial meniscus, unspecified knee: Secondary | ICD-10-CM | POA: Diagnosis not present

## 2011-12-02 DIAGNOSIS — Z9289 Personal history of other medical treatment: Secondary | ICD-10-CM

## 2011-12-02 HISTORY — DX: Personal history of other medical treatment: Z92.89

## 2011-12-04 DIAGNOSIS — M545 Low back pain: Secondary | ICD-10-CM | POA: Diagnosis not present

## 2011-12-04 DIAGNOSIS — M79609 Pain in unspecified limb: Secondary | ICD-10-CM | POA: Diagnosis not present

## 2011-12-04 DIAGNOSIS — M5137 Other intervertebral disc degeneration, lumbosacral region: Secondary | ICD-10-CM | POA: Diagnosis not present

## 2011-12-04 DIAGNOSIS — G894 Chronic pain syndrome: Secondary | ICD-10-CM | POA: Diagnosis not present

## 2011-12-05 ENCOUNTER — Other Ambulatory Visit (HOSPITAL_COMMUNITY): Payer: Self-pay | Admitting: Orthopaedic Surgery

## 2011-12-07 ENCOUNTER — Telehealth: Payer: Self-pay | Admitting: Cardiology

## 2011-12-07 NOTE — Telephone Encounter (Signed)
Patient called to see if she had an appointment on 11-1. Verified that she didn't not have a appointment on this day that, that was an old appointment.

## 2011-12-11 ENCOUNTER — Encounter (HOSPITAL_COMMUNITY): Payer: Self-pay | Admitting: Pharmacy Technician

## 2011-12-11 DIAGNOSIS — D510 Vitamin B12 deficiency anemia due to intrinsic factor deficiency: Secondary | ICD-10-CM | POA: Diagnosis not present

## 2011-12-11 DIAGNOSIS — IMO0001 Reserved for inherently not codable concepts without codable children: Secondary | ICD-10-CM | POA: Diagnosis not present

## 2011-12-11 DIAGNOSIS — I10 Essential (primary) hypertension: Secondary | ICD-10-CM | POA: Diagnosis not present

## 2011-12-11 DIAGNOSIS — M549 Dorsalgia, unspecified: Secondary | ICD-10-CM | POA: Diagnosis not present

## 2011-12-11 DIAGNOSIS — G609 Hereditary and idiopathic neuropathy, unspecified: Secondary | ICD-10-CM | POA: Diagnosis not present

## 2011-12-11 DIAGNOSIS — G8929 Other chronic pain: Secondary | ICD-10-CM | POA: Diagnosis not present

## 2011-12-13 DIAGNOSIS — D485 Neoplasm of uncertain behavior of skin: Secondary | ICD-10-CM | POA: Diagnosis not present

## 2011-12-17 ENCOUNTER — Encounter (HOSPITAL_COMMUNITY)
Admission: RE | Admit: 2011-12-17 | Discharge: 2011-12-17 | Disposition: A | Discharge status: Home / Self Care | Payer: Medicare Other | Source: Ambulatory Visit | Attending: Orthopaedic Surgery | Admitting: Orthopaedic Surgery

## 2011-12-17 ENCOUNTER — Ambulatory Visit (HOSPITAL_COMMUNITY)
Admission: RE | Admit: 2011-12-17 | Discharge: 2011-12-17 | Disposition: A | Discharge status: Home / Self Care | Payer: Medicare Other | Source: Ambulatory Visit | Attending: Orthopaedic Surgery | Admitting: Orthopaedic Surgery

## 2011-12-17 ENCOUNTER — Encounter (HOSPITAL_COMMUNITY): Payer: Self-pay

## 2011-12-17 DIAGNOSIS — Z01810 Encounter for preprocedural cardiovascular examination: Secondary | ICD-10-CM | POA: Diagnosis not present

## 2011-12-17 DIAGNOSIS — Z01812 Encounter for preprocedural laboratory examination: Secondary | ICD-10-CM | POA: Diagnosis not present

## 2011-12-17 DIAGNOSIS — I10 Essential (primary) hypertension: Secondary | ICD-10-CM | POA: Insufficient documentation

## 2011-12-17 DIAGNOSIS — M169 Osteoarthritis of hip, unspecified: Secondary | ICD-10-CM | POA: Diagnosis not present

## 2011-12-17 DIAGNOSIS — M1610 Unilateral primary osteoarthritis, unspecified hip: Secondary | ICD-10-CM | POA: Insufficient documentation

## 2011-12-17 HISTORY — DX: Malignant (primary) neoplasm, unspecified: C80.1

## 2011-12-17 HISTORY — DX: Unspecified osteoarthritis, unspecified site: M19.90

## 2011-12-17 HISTORY — DX: Headache: R51

## 2011-12-17 HISTORY — DX: Anemia, unspecified: D64.9

## 2011-12-17 HISTORY — DX: Hypothyroidism, unspecified: E03.9

## 2011-12-17 LAB — URINALYSIS, ROUTINE W REFLEX MICROSCOPIC
Glucose, UA: NEGATIVE mg/dL
Hgb urine dipstick: NEGATIVE
Ketones, ur: NEGATIVE mg/dL
Protein, ur: NEGATIVE mg/dL
pH: 6 (ref 5.0–8.0)

## 2011-12-17 LAB — CBC
HCT: 34.3 % — ABNORMAL LOW (ref 36.0–46.0)
Hemoglobin: 10.9 g/dL — ABNORMAL LOW (ref 12.0–15.0)
MCH: 27.5 pg (ref 26.0–34.0)
MCHC: 31.8 g/dL (ref 30.0–36.0)
MCV: 86.6 fL (ref 78.0–100.0)
RBC: 3.96 MIL/uL (ref 3.87–5.11)

## 2011-12-17 LAB — SURGICAL PCR SCREEN: MRSA, PCR: NEGATIVE

## 2011-12-17 LAB — PROTIME-INR
INR: 0.96 (ref 0.00–1.49)
Prothrombin Time: 13 seconds (ref 11.6–15.2)

## 2011-12-17 LAB — BASIC METABOLIC PANEL
BUN: 9 mg/dL (ref 6–23)
CO2: 29 mEq/L (ref 19–32)
Chloride: 100 mEq/L (ref 96–112)
Glucose, Bld: 95 mg/dL (ref 70–99)
Potassium: 3.6 mEq/L (ref 3.5–5.1)

## 2011-12-17 LAB — APTT: aPTT: 33 seconds (ref 24–37)

## 2011-12-17 NOTE — Progress Notes (Signed)
01/15/11 last EKG in [facility]  Stress Test 02/18/11 [facility]  01/18/11 ECHO in [facility]  02/01/11 LOV with cardiologist - Dr Kirke Corin in [facility]

## 2011-12-17 NOTE — Patient Instructions (Signed)
20 Margaret Cowan  12/17/2011   Your procedure is scheduled on:  12/21/11 1000-1200noon  Report to [facility] at 0730 AM.  Call this number if you have problems the morning of surgery: [phone]   Remember:   Do not eat food:After Midnight.  May have clear liquids:until Midnight .    Take these medicines the morning of surgery with A SIP OF WATER:    Do not wear jewelry, make-up or nail polish.  Do not wear lotions, powders, or perfumes.   Do not shave 48 hours prior to surgery.   Do not bring valuables to the hospital.  Contacts, dentures or bridgework may not be worn into surgery.  Leave suitcase in the car. After surgery it may be brought to your room.  For patients admitted to the hospital, checkout time is 11:00 AM the day of discharge.       Special Instructions: CHG Shower Use Special Wash: 1/2 bottle night before surgery and 1/2 bottle morning of surgery. shower chin to toes with CHG. Wash face and private parts with regular soap.   Please read over the following fact sheets that you were given: MRSA Information, coughing and deep breathing exercises, leg exercises

## 2011-12-17 NOTE — Progress Notes (Signed)
Spoke with Judeth Cornfield at office of DR Maureen Ralphs and made aware that B/P at today's preop visit was 102/65 and 99/60.  Pt denied any dizziness or lightheadedness.  Pt was seen according to note in chart by Dr Sherwood Gambler on 11/07/11 due to B/P running high and heart rate low.  Heart rate at today visit- 64.  Pt was to followup with PCp in 1-2 weeks at PCP.  No followup done according to patient.  Also, pt had mole removed last week on left lower leg below knee and has stitches intact.  Stitches to be removed while in hospital per patient.  Blood Pressure at office visit of 11/07/11- 142/82.

## 2011-12-17 NOTE — Progress Notes (Signed)
Patient had recent mole removed from below the knee on left leg. Patient has had post procedure followup with MD who performed procedure.  Pt still has stitches in place.  Stitches to be removed in hospital per pt .  Patient has order with her stating stitches to be removed in hospital.  Patient instructed to bring day of surgery.  Patient instructed to inform Dr Doneen Poisson of mole removal.  Patient voiced understanding.

## 2011-12-20 DIAGNOSIS — M5412 Radiculopathy, cervical region: Secondary | ICD-10-CM | POA: Diagnosis not present

## 2011-12-20 DIAGNOSIS — M79609 Pain in unspecified limb: Secondary | ICD-10-CM | POA: Diagnosis not present

## 2011-12-20 DIAGNOSIS — M169 Osteoarthritis of hip, unspecified: Secondary | ICD-10-CM | POA: Diagnosis not present

## 2011-12-20 DIAGNOSIS — M542 Cervicalgia: Secondary | ICD-10-CM | POA: Diagnosis not present

## 2011-12-20 DIAGNOSIS — M5137 Other intervertebral disc degeneration, lumbosacral region: Secondary | ICD-10-CM | POA: Diagnosis not present

## 2011-12-20 DIAGNOSIS — G894 Chronic pain syndrome: Secondary | ICD-10-CM | POA: Diagnosis not present

## 2011-12-20 DIAGNOSIS — R6889 Other general symptoms and signs: Secondary | ICD-10-CM | POA: Diagnosis not present

## 2011-12-20 DIAGNOSIS — M545 Low back pain: Secondary | ICD-10-CM | POA: Diagnosis not present

## 2011-12-20 NOTE — Progress Notes (Signed)
Requested and received Sleep Study note from office of Dr Gerilyn Pilgrim dated 12/24/2005.  Highlighted CPAP settings on Sleep Study report.

## 2011-12-21 ENCOUNTER — Encounter (HOSPITAL_COMMUNITY)
Admission: RE | Disposition: A | Discharge status: Home Health | Payer: Self-pay | Source: Ambulatory Visit | Attending: Orthopaedic Surgery

## 2011-12-21 ENCOUNTER — Ambulatory Visit (HOSPITAL_COMMUNITY): Payer: Medicare Other

## 2011-12-21 ENCOUNTER — Encounter (HOSPITAL_COMMUNITY): Payer: Self-pay | Admitting: Anesthesiology

## 2011-12-21 ENCOUNTER — Ambulatory Visit (HOSPITAL_COMMUNITY): Payer: Medicare Other | Admitting: Anesthesiology

## 2011-12-21 ENCOUNTER — Encounter (HOSPITAL_COMMUNITY): Payer: Self-pay | Admitting: *Deleted

## 2011-12-21 ENCOUNTER — Encounter (HOSPITAL_COMMUNITY): Payer: Self-pay | Admitting: Orthopedic Surgery

## 2011-12-21 ENCOUNTER — Inpatient Hospital Stay (HOSPITAL_COMMUNITY)
Admission: RE | Admit: 2011-12-21 | Discharge: 2011-12-25 | DRG: 470 | Disposition: A | Discharge status: Home Health | Payer: Medicare Other | Source: Ambulatory Visit | Attending: Orthopaedic Surgery | Admitting: Orthopaedic Surgery

## 2011-12-21 DIAGNOSIS — G4730 Sleep apnea, unspecified: Secondary | ICD-10-CM | POA: Diagnosis present

## 2011-12-21 DIAGNOSIS — M169 Osteoarthritis of hip, unspecified: Secondary | ICD-10-CM | POA: Diagnosis not present

## 2011-12-21 DIAGNOSIS — M25559 Pain in unspecified hip: Secondary | ICD-10-CM | POA: Diagnosis not present

## 2011-12-21 DIAGNOSIS — G8929 Other chronic pain: Secondary | ICD-10-CM | POA: Diagnosis not present

## 2011-12-21 DIAGNOSIS — IMO0001 Reserved for inherently not codable concepts without codable children: Secondary | ICD-10-CM | POA: Diagnosis present

## 2011-12-21 DIAGNOSIS — E039 Hypothyroidism, unspecified: Secondary | ICD-10-CM | POA: Diagnosis present

## 2011-12-21 DIAGNOSIS — I10 Essential (primary) hypertension: Secondary | ICD-10-CM | POA: Diagnosis present

## 2011-12-21 DIAGNOSIS — M1610 Unilateral primary osteoarthritis, unspecified hip: Secondary | ICD-10-CM | POA: Diagnosis not present

## 2011-12-21 DIAGNOSIS — Z96649 Presence of unspecified artificial hip joint: Secondary | ICD-10-CM | POA: Diagnosis not present

## 2011-12-21 DIAGNOSIS — D62 Acute posthemorrhagic anemia: Secondary | ICD-10-CM | POA: Diagnosis not present

## 2011-12-21 HISTORY — PX: TOTAL HIP ARTHROPLASTY: SHX124

## 2011-12-21 LAB — ABO/RH: ABO/RH(D): A POS

## 2011-12-21 SURGERY — ARTHROPLASTY, HIP, TOTAL, ANTERIOR APPROACH
Anesthesia: Spinal | Site: Hip | Laterality: Right | Wound class: Clean

## 2011-12-21 MED ORDER — SODIUM CHLORIDE 0.9 % IV SOLN
INTRAVENOUS | Status: DC
  Administered 2011-12-21: 75 mL/h via INTRAVENOUS
  Administered 2011-12-22: 1000 mL via INTRAVENOUS

## 2011-12-21 MED ORDER — AMLODIPINE BESYLATE 5 MG PO TABS
5.0000 mg | ORAL_TABLET | Freq: Two times a day (BID) | ORAL | Status: DC
  Administered 2011-12-22 – 2011-12-24 (×6): 5 mg via ORAL
  Filled 2011-12-21 (×9): qty 1

## 2011-12-21 MED ORDER — HYDROMORPHONE HCL 2 MG PO TABS
4.0000 mg | ORAL_TABLET | ORAL | Status: DC | PRN
Start: 2011-12-21 — End: 2011-12-25
  Administered 2011-12-22 – 2011-12-25 (×9): 4 mg via ORAL
  Filled 2011-12-21 (×10): qty 2

## 2011-12-21 MED ORDER — HYDROMORPHONE HCL PF 1 MG/ML IJ SOLN
0.2500 mg | INTRAMUSCULAR | Status: DC | PRN
  Administered 2011-12-21 (×4): 0.5 mg via INTRAVENOUS

## 2011-12-21 MED ORDER — HYDROMORPHONE HCL PF 1 MG/ML IJ SOLN
INTRAMUSCULAR | Status: AC
  Filled 2011-12-21: qty 1

## 2011-12-21 MED ORDER — METHOCARBAMOL 100 MG/ML IJ SOLN
500.0000 mg | Freq: Four times a day (QID) | INTRAMUSCULAR | Status: DC | PRN
  Administered 2011-12-21: 500 mg via INTRAVENOUS
  Filled 2011-12-21: qty 5

## 2011-12-21 MED ORDER — MEPERIDINE HCL 50 MG/ML IJ SOLN: 6.2500 mg | INTRAMUSCULAR | Status: DC | PRN

## 2011-12-21 MED ORDER — MIDAZOLAM HCL 5 MG/5ML IJ SOLN
INTRAMUSCULAR | Status: DC | PRN
  Administered 2011-12-21: 2 mg via INTRAVENOUS

## 2011-12-21 MED ORDER — PSYLLIUM 0.52 G PO CAPS: 0.5200 g | ORAL_CAPSULE | Freq: Every day | ORAL | Status: DC

## 2011-12-21 MED ORDER — FENTANYL CITRATE 0.05 MG/ML IJ SOLN
INTRAMUSCULAR | Status: DC | PRN
  Administered 2011-12-21 (×2): 50 ug via INTRAVENOUS

## 2011-12-21 MED ORDER — ACETAMINOPHEN 650 MG RE SUPP: 650.0000 mg | Freq: Four times a day (QID) | RECTAL | Status: DC | PRN

## 2011-12-21 MED ORDER — OXYCODONE HCL 5 MG PO TABS: 5.0000 mg | ORAL_TABLET | ORAL | Status: DC | PRN

## 2011-12-21 MED ORDER — LIDOCAINE HCL (CARDIAC) 20 MG/ML IV SOLN
INTRAVENOUS | Status: DC | PRN
  Administered 2011-12-21: 100 mg via INTRAVENOUS

## 2011-12-21 MED ORDER — LEVOTHYROXINE SODIUM 25 MCG PO TABS
25.0000 ug | ORAL_TABLET | Freq: Every day | ORAL | Status: DC
  Administered 2011-12-22 – 2011-12-25 (×4): 25 ug via ORAL
  Filled 2011-12-21 (×5): qty 1

## 2011-12-21 MED ORDER — MENTHOL 3 MG MT LOZG: 1.0000 | LOZENGE | OROMUCOSAL | Status: DC | PRN

## 2011-12-21 MED ORDER — CEFAZOLIN SODIUM-DEXTROSE 2-3 GM-% IV SOLR
2.0000 g | INTRAVENOUS | Status: AC
  Administered 2011-12-21: 2 g via INTRAVENOUS

## 2011-12-21 MED ORDER — LACTATED RINGERS IV SOLN
INTRAVENOUS | Status: DC | PRN
  Administered 2011-12-21: 12:00:00 via INTRAVENOUS

## 2011-12-21 MED ORDER — PROPOFOL INFUSION 10 MG/ML OPTIME
INTRAVENOUS | Status: DC | PRN
  Administered 2011-12-21: 100 ug/kg/min via INTRAVENOUS

## 2011-12-21 MED ORDER — ONDANSETRON HCL 4 MG/2ML IJ SOLN
INTRAMUSCULAR | Status: DC | PRN
  Administered 2011-12-21: 4 mg via INTRAVENOUS

## 2011-12-21 MED ORDER — ONDANSETRON HCL 4 MG/2ML IJ SOLN
4.0000 mg | Freq: Four times a day (QID) | INTRAMUSCULAR | Status: DC | PRN
  Administered 2011-12-22: 4 mg via INTRAVENOUS
  Filled 2011-12-21: qty 2

## 2011-12-21 MED ORDER — ACETAMINOPHEN 325 MG PO TABS: 650.0000 mg | ORAL_TABLET | Freq: Four times a day (QID) | ORAL | Status: DC | PRN

## 2011-12-21 MED ORDER — ACETAMINOPHEN 10 MG/ML IV SOLN
INTRAVENOUS | Status: AC
  Filled 2011-12-21: qty 100

## 2011-12-21 MED ORDER — CEFAZOLIN SODIUM-DEXTROSE 2-3 GM-% IV SOLR
INTRAVENOUS | Status: AC
  Filled 2011-12-21: qty 50

## 2011-12-21 MED ORDER — METOCLOPRAMIDE HCL 5 MG/ML IJ SOLN: 5.0000 mg | Freq: Three times a day (TID) | INTRAMUSCULAR | Status: DC | PRN

## 2011-12-21 MED ORDER — HYDROMORPHONE 0.3 MG/ML IV SOLN
INTRAVENOUS | Status: AC
  Filled 2011-12-21: qty 25

## 2011-12-21 MED ORDER — KETOROLAC TROMETHAMINE 15 MG/ML IJ SOLN
15.0000 mg | Freq: Four times a day (QID) | INTRAMUSCULAR | Status: AC
  Administered 2011-12-21 – 2011-12-22 (×4): 15 mg via INTRAVENOUS
  Filled 2011-12-21 (×4): qty 1

## 2011-12-21 MED ORDER — DIPHENHYDRAMINE HCL 12.5 MG/5ML PO ELIX: 12.5000 mg | ORAL_SOLUTION | Freq: Four times a day (QID) | ORAL | Status: DC | PRN

## 2011-12-21 MED ORDER — 0.9 % SODIUM CHLORIDE (POUR BTL) OPTIME
TOPICAL | Status: DC | PRN
  Administered 2011-12-21: 1000 mL

## 2011-12-21 MED ORDER — FERROUS SULFATE 325 (65 FE) MG PO TABS
325.0000 mg | ORAL_TABLET | Freq: Three times a day (TID) | ORAL | Status: DC
  Administered 2011-12-22 – 2011-12-25 (×11): 325 mg via ORAL
  Filled 2011-12-21 (×14): qty 1

## 2011-12-21 MED ORDER — HYDROMORPHONE HCL PF 1 MG/ML IJ SOLN: 1.0000 mg | INTRAMUSCULAR | Status: DC | PRN

## 2011-12-21 MED ORDER — LISINOPRIL 40 MG PO TABS
40.0000 mg | ORAL_TABLET | Freq: Every day | ORAL | Status: DC
  Administered 2011-12-22 – 2011-12-24 (×3): 40 mg via ORAL
  Filled 2011-12-21 (×4): qty 1

## 2011-12-21 MED ORDER — LACTATED RINGERS IV SOLN
INTRAVENOUS | Status: DC | PRN
  Administered 2011-12-21: 09:00:00 via INTRAVENOUS

## 2011-12-21 MED ORDER — HYDROMORPHONE 0.3 MG/ML IV SOLN
INTRAVENOUS | Status: DC
  Administered 2011-12-21: 0.6 mg via INTRAVENOUS
  Administered 2011-12-21: 14:00:00 via INTRAVENOUS
  Administered 2011-12-21: 0.3 mg via INTRAVENOUS
  Administered 2011-12-22: 0.6 mg via INTRAVENOUS
  Administered 2011-12-22: 0.9 mg via INTRAVENOUS
  Administered 2011-12-22: 0.6 mg via INTRAVENOUS
  Administered 2011-12-22: 1.2 mg via INTRAVENOUS
  Filled 2011-12-21: qty 25

## 2011-12-21 MED ORDER — DIPHENHYDRAMINE HCL 12.5 MG/5ML PO ELIX: 12.5000 mg | ORAL_SOLUTION | ORAL | Status: DC | PRN

## 2011-12-21 MED ORDER — ACETAMINOPHEN 10 MG/ML IV SOLN
INTRAVENOUS | Status: DC | PRN
  Administered 2011-12-21: 1000 mg via INTRAVENOUS

## 2011-12-21 MED ORDER — KETAMINE HCL 10 MG/ML IJ SOLN
INTRAMUSCULAR | Status: DC | PRN
  Administered 2011-12-21: 30 mg via INTRAVENOUS

## 2011-12-21 MED ORDER — NALOXONE HCL 0.4 MG/ML IJ SOLN: 0.4000 mg | INTRAMUSCULAR | Status: DC | PRN

## 2011-12-21 MED ORDER — PSYLLIUM 95 % PO PACK
1.0000 | PACK | Freq: Every day | ORAL | Status: DC
  Administered 2011-12-22 – 2011-12-23 (×2): 1 via ORAL
  Filled 2011-12-21 (×5): qty 1

## 2011-12-21 MED ORDER — PHENYLEPHRINE HCL 10 MG/ML IJ SOLN
20.0000 mg | INTRAVENOUS | Status: DC | PRN
  Administered 2011-12-21: 15 ug/min via INTRAVENOUS

## 2011-12-21 MED ORDER — LACTATED RINGERS IV SOLN: INTRAVENOUS | Status: DC

## 2011-12-21 MED ORDER — EPHEDRINE SULFATE 50 MG/ML IJ SOLN
INTRAMUSCULAR | Status: DC | PRN
  Administered 2011-12-21 (×2): 5 mg via INTRAVENOUS
  Administered 2011-12-21: 10 mg via INTRAVENOUS
  Administered 2011-12-21: 5 mg via INTRAVENOUS

## 2011-12-21 MED ORDER — BUPIVACAINE HCL (PF) 0.5 % IJ SOLN
INTRAMUSCULAR | Status: DC | PRN
  Administered 2011-12-21: 3 mL

## 2011-12-21 MED ORDER — ONDANSETRON HCL 4 MG PO TABS
4.0000 mg | ORAL_TABLET | Freq: Four times a day (QID) | ORAL | Status: DC | PRN
  Administered 2011-12-22 – 2011-12-24 (×4): 4 mg via ORAL
  Filled 2011-12-21 (×4): qty 1

## 2011-12-21 MED ORDER — PANTOPRAZOLE SODIUM 40 MG PO TBEC
40.0000 mg | DELAYED_RELEASE_TABLET | Freq: Every day | ORAL | Status: DC
  Administered 2011-12-21 – 2011-12-25 (×5): 40 mg via ORAL
  Filled 2011-12-21 (×5): qty 1

## 2011-12-21 MED ORDER — ONDANSETRON HCL 4 MG/2ML IJ SOLN: 4.0000 mg | Freq: Four times a day (QID) | INTRAMUSCULAR | Status: DC | PRN

## 2011-12-21 MED ORDER — PHENOL 1.4 % MT LIQD: 1.0000 | OROMUCOSAL | Status: DC | PRN

## 2011-12-21 MED ORDER — BUPIVACAINE HCL (PF) 0.5 % IJ SOLN
INTRAMUSCULAR | Status: AC
  Filled 2011-12-21: qty 30

## 2011-12-21 MED ORDER — METHOCARBAMOL 500 MG PO TABS
500.0000 mg | ORAL_TABLET | Freq: Four times a day (QID) | ORAL | Status: DC | PRN
  Administered 2011-12-22 – 2011-12-25 (×6): 500 mg via ORAL
  Filled 2011-12-21 (×6): qty 1

## 2011-12-21 MED ORDER — ALPRAZOLAM 1 MG PO TABS
2.0000 mg | ORAL_TABLET | Freq: Four times a day (QID) | ORAL | Status: DC | PRN
  Administered 2011-12-22 – 2011-12-25 (×8): 2 mg via ORAL
  Filled 2011-12-21: qty 2
  Filled 2011-12-21: qty 1
  Filled 2011-12-21 (×6): qty 2
  Filled 2011-12-21: qty 1
  Filled 2011-12-21: qty 2

## 2011-12-21 MED ORDER — ALUM & MAG HYDROXIDE-SIMETH 200-200-20 MG/5ML PO SUSP: 30.0000 mL | ORAL | Status: DC | PRN

## 2011-12-21 MED ORDER — PROMETHAZINE HCL 25 MG/ML IJ SOLN: 6.2500 mg | INTRAMUSCULAR | Status: DC | PRN

## 2011-12-21 MED ORDER — PHENYLEPHRINE HCL 10 MG/ML IJ SOLN
INTRAMUSCULAR | Status: DC | PRN
  Administered 2011-12-21: 40 ug via INTRAVENOUS
  Administered 2011-12-21: 160 ug via INTRAVENOUS

## 2011-12-21 MED ORDER — CEFAZOLIN SODIUM 1-5 GM-% IV SOLN
1.0000 g | Freq: Four times a day (QID) | INTRAVENOUS | Status: AC
  Administered 2011-12-21 – 2011-12-22 (×2): 1 g via INTRAVENOUS
  Filled 2011-12-21 (×2): qty 50

## 2011-12-21 MED ORDER — DOCUSATE SODIUM 100 MG PO CAPS
100.0000 mg | ORAL_CAPSULE | Freq: Two times a day (BID) | ORAL | Status: DC
  Administered 2011-12-21 – 2011-12-23 (×3): 100 mg via ORAL

## 2011-12-21 MED ORDER — ESCITALOPRAM OXALATE 10 MG PO TABS
10.0000 mg | ORAL_TABLET | Freq: Every day | ORAL | Status: DC
  Administered 2011-12-22 – 2011-12-25 (×4): 10 mg via ORAL
  Filled 2011-12-21 (×5): qty 1

## 2011-12-21 MED ORDER — ZOLPIDEM TARTRATE 5 MG PO TABS: 5.0000 mg | ORAL_TABLET | Freq: Every evening | ORAL | Status: DC | PRN

## 2011-12-21 MED ORDER — METOCLOPRAMIDE HCL 10 MG PO TABS: 5.0000 mg | ORAL_TABLET | Freq: Three times a day (TID) | ORAL | Status: DC | PRN

## 2011-12-21 MED ORDER — ASPIRIN EC 325 MG PO TBEC
325.0000 mg | DELAYED_RELEASE_TABLET | Freq: Two times a day (BID) | ORAL | Status: DC
  Administered 2011-12-22 – 2011-12-25 (×7): 325 mg via ORAL
  Filled 2011-12-21 (×9): qty 1

## 2011-12-21 MED ORDER — SODIUM CHLORIDE 0.9 % IJ SOLN: 9.0000 mL | INTRAMUSCULAR | Status: DC | PRN

## 2011-12-21 MED ORDER — COLESEVELAM HCL 625 MG PO TABS
1875.0000 mg | ORAL_TABLET | Freq: Two times a day (BID) | ORAL | Status: DC
  Administered 2011-12-21 – 2011-12-25 (×8): 1875 mg via ORAL
  Filled 2011-12-21 (×10): qty 3

## 2011-12-21 MED ORDER — DIPHENHYDRAMINE HCL 50 MG/ML IJ SOLN: 12.5000 mg | Freq: Four times a day (QID) | INTRAMUSCULAR | Status: DC | PRN

## 2011-12-21 SURGICAL SUPPLY — 36 items
BAG ZIPLOCK 12X15 (MISCELLANEOUS) ×4 IMPLANT
BLADE SAW SGTL 18X1.27X75 (BLADE) ×2 IMPLANT
CELLS DAT CNTRL 66122 CELL SVR (MISCELLANEOUS) ×1 IMPLANT
CLOTH BEACON ORANGE TIMEOUT ST (SAFETY) ×2 IMPLANT
DRAPE C-ARM 42X72 X-RAY (DRAPES) ×2 IMPLANT
DRAPE LG THREE QUARTER DISP (DRAPES) ×2 IMPLANT
DRAPE STERI IOBAN 125X83 (DRAPES) ×2 IMPLANT
DRAPE U-SHAPE 47X51 STRL (DRAPES) ×6 IMPLANT
DRSG MEPILEX BORDER 4X8 (GAUZE/BANDAGES/DRESSINGS) ×2 IMPLANT
DRSG XEROFORM 1X8 (GAUZE/BANDAGES/DRESSINGS) ×2 IMPLANT
DURAPREP 26ML APPLICATOR (WOUND CARE) ×2 IMPLANT
ELECT BLADE TIP CTD 4 INCH (ELECTRODE) ×2 IMPLANT
ELECT REM PT RETURN 9FT ADLT (ELECTROSURGICAL) ×2
ELECTRODE REM PT RTRN 9FT ADLT (ELECTROSURGICAL) ×1 IMPLANT
FACESHIELD LNG OPTICON STERILE (SAFETY) ×8 IMPLANT
GAUZE XEROFORM 1X8 LF (GAUZE/BANDAGES/DRESSINGS) ×2 IMPLANT
GLOVE BIO SURGEON STRL SZ7 (GLOVE) ×2 IMPLANT
GLOVE BIO SURGEON STRL SZ7.5 (GLOVE) ×2 IMPLANT
GLOVE BIOGEL PI IND STRL 7.5 (GLOVE) IMPLANT
GLOVE BIOGEL PI IND STRL 8 (GLOVE) ×1 IMPLANT
GLOVE BIOGEL PI INDICATOR 7.5 (GLOVE)
GLOVE BIOGEL PI INDICATOR 8 (GLOVE) ×1
GLOVE ECLIPSE 7.0 STRL STRAW (GLOVE) ×2 IMPLANT
GOWN STRL REIN XL XLG (GOWN DISPOSABLE) ×4 IMPLANT
KIT BASIN OR (CUSTOM PROCEDURE TRAY) ×2 IMPLANT
PACK TOTAL JOINT (CUSTOM PROCEDURE TRAY) ×2 IMPLANT
PADDING CAST COTTON 6X4 STRL (CAST SUPPLIES) ×2 IMPLANT
RTRCTR WOUND ALEXIS 18CM MED (MISCELLANEOUS) ×2
STAPLER VISISTAT 35W (STAPLE) IMPLANT
SUT ETHIBOND NAB CT1 #1 30IN (SUTURE) ×4 IMPLANT
SUT VIC AB 1 CT1 36 (SUTURE) ×4 IMPLANT
SUT VIC AB 2-0 CT1 27 (SUTURE) ×2
SUT VIC AB 2-0 CT1 TAPERPNT 27 (SUTURE) ×2 IMPLANT
TOWEL OR 17X26 10 PK STRL BLUE (TOWEL DISPOSABLE) ×4 IMPLANT
TOWEL OR NON WOVEN STRL DISP B (DISPOSABLE) ×2 IMPLANT
TRAY FOLEY CATH 14FRSI W/METER (CATHETERS) ×2 IMPLANT

## 2011-12-21 NOTE — Anesthesia Procedure Notes (Signed)
Spinal  Patient location during procedure: OR Staffing Anesthesiologist: Amri Lien Performed by: anesthesiologist  Preanesthetic Checklist Completed: patient identified, site marked, surgical consent, pre-op evaluation, timeout performed, IV checked, risks and benefits discussed and monitors and equipment checked Spinal Block Patient position: sitting Prep: Betadine Patient monitoring: heart rate, continuous pulse ox and blood pressure Approach: right paramedian Location: L3-4 Injection technique: single-shot Needle Needle type: Spinocan  Needle gauge: 22 G Needle length: 9 cm Additional Notes Expiration date of kit checked and confirmed. Patient tolerated procedure well, without complications.     

## 2011-12-21 NOTE — Preoperative (Signed)
Beta Blockers   Reason not to administer Beta Blockers:Not Applicable 

## 2011-12-21 NOTE — Progress Notes (Signed)
Portable AP Pelvis and Lateral Right Hip X-rays done. 

## 2011-12-21 NOTE — Transfer of Care (Signed)
Immediate Anesthesia Transfer of Care Note  Patient: Margaret Cowan  Procedure(s) Performed: Procedure(s) (LRB) with comments: TOTAL HIP ARTHROPLASTY ANTERIOR APPROACH (Right) - Right Total Hip Arthroplasty  Patient Location: PACU  Anesthesia Type: MAC and Spinal  Level of Consciousness: awake, alert , oriented and patient cooperative  Airway & Oxygen Therapy: Patient Spontanous Breathing and Patient connected to face mask oxygen  Post-op Assessment: Report given to PACU RN and Post -op Vital signs reviewed and stable  Post vital signs: Reviewed and stable  Complications: No apparent anesthesia complications

## 2011-12-21 NOTE — Anesthesia Preprocedure Evaluation (Addendum)
Anesthesia Evaluation  Patient identified by MRN, date of birth, ID band Patient awake    Reviewed: Allergy & Precautions, H&P , NPO status , Patient's Chart, lab work & pertinent test results  History of Anesthesia Complications (+) AWARENESS UNDER ANESTHESIA  Airway Mallampati: II TM Distance: >3 FB Neck ROM: Full    Dental No notable dental hx.    Pulmonary neg pulmonary ROS, sleep apnea and Continuous Positive Airway Pressure Ventilation ,  breath sounds clear to auscultation  Pulmonary exam normal       Cardiovascular hypertension, Pt. on medications - angina+CHF negative cardio ROS  Rhythm:Regular Rate:Normal     Neuro/Psych PSYCHIATRIC DISORDERS (schizoaffective disorder) negative neurological ROS  negative psych ROS   GI/Hepatic negative GI ROS, Neg liver ROS,   Endo/Other  negative endocrine ROSHypothyroidism   Renal/GU negative Renal ROS  negative genitourinary   Musculoskeletal negative musculoskeletal ROS (+) Fibromyalgia -  Abdominal   Peds negative pediatric ROS (+)  Hematology negative hematology ROS (+)   Anesthesia Other Findings Upper front left cap.   Reproductive/Obstetrics negative OB ROS                          Anesthesia Physical Anesthesia Plan  ASA: II  Anesthesia Plan: Spinal   Post-op Pain Management:    Induction:   Airway Management Planned: Simple Face Mask  Additional Equipment:   Intra-op Plan:   Post-operative Plan:   Informed Consent: I have reviewed the patients History and Physical, chart, labs and discussed the procedure including the risks, benefits and alternatives for the proposed anesthesia with the patient or authorized representative who has indicated his/her understanding and acceptance.   Dental advisory given  Plan Discussed with: CRNA  Anesthesia Plan Comments:         Anesthesia Quick Evaluation

## 2011-12-21 NOTE — Anesthesia Postprocedure Evaluation (Signed)
  Anesthesia Post-op Note  Patient: Margaret Cowan  Procedure(s) Performed: Procedure(s) (LRB): TOTAL HIP ARTHROPLASTY ANTERIOR APPROACH (Right)  Patient Location: PACU  Anesthesia Type: Spinal  Level of Consciousness: awake and alert   Airway and Oxygen Therapy: Patient Spontanous Breathing  Post-op Pain: mild  Post-op Assessment: Post-op Vital signs reviewed, Patient's Cardiovascular Status Stable, Respiratory Function Stable, Patent Airway and No signs of Nausea or vomiting  Post-op Vital Signs: stable  Complications: No apparent anesthesia complications

## 2011-12-21 NOTE — Progress Notes (Signed)
Utilization review completed.  

## 2011-12-21 NOTE — Progress Notes (Signed)
X-Ray results noted 

## 2011-12-21 NOTE — Brief Op Note (Signed)
12/21/2011  12:34 PM  PATIENT:  Nelda Bucks  61 y.o. female  PRE-OPERATIVE DIAGNOSIS:  Right hip severe osteoarthritis  POST-OPERATIVE DIAGNOSIS:  Right hip severe osteoarthritis  PROCEDURE:  Procedure(s) (LRB) with comments: TOTAL HIP ARTHROPLASTY ANTERIOR APPROACH (Right) - Right Total Hip Arthroplasty  SURGEON:  Surgeon(s) and Role:    * Kathryne Hitch, MD - Primary  PHYSICIAN ASSISTANT:   ASSISTANTS: none   ANESTHESIA:   spinal  EBL:  Total I/O In: 1000 [I.V.:1000] Out: 900 [Urine:200; Blood:700]  BLOOD ADMINISTERED:none  DRAINS: none   LOCAL MEDICATIONS USED:  NONE  SPECIMEN:  No Specimen  DISPOSITION OF SPECIMEN:  N/A  COUNTS:  YES  TOURNIQUET:  * No tourniquets in log *  DICTATION: .Other Dictation: Dictation Number [id number]  PLAN OF CARE: Admit to inpatient   PATIENT DISPOSITION:  PACU - hemodynamically stable.   Delay start of Pharmacological VTE agent (>24hrs) due to surgical blood loss or risk of bleeding: no
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
## 2011-12-21 NOTE — Progress Notes (Signed)
Left hand and forearm swollen from infiltrated IV. IV removed. Heat applied to arm and hand. Patient complains of minor discomfort. Will continue to monitor.

## 2011-12-22 LAB — BASIC METABOLIC PANEL
BUN: 5 mg/dL — ABNORMAL LOW (ref 6–23)
Calcium: 7.9 mg/dL — ABNORMAL LOW (ref 8.4–10.5)
GFR calc Af Amer: >90 mL/min (ref >90)
GFR calc non Af Amer: >90 mL/min (ref >90)
Glucose, Bld: 119 mg/dL — ABNORMAL HIGH (ref 70–99)
Potassium: 4 mEq/L (ref 3.5–5.1)

## 2011-12-22 LAB — CBC
HCT: 25.2 % — ABNORMAL LOW (ref 36.0–46.0)
Hemoglobin: 8.3 g/dL — ABNORMAL LOW (ref 12.0–15.0)
MCH: 28.7 pg (ref 26.0–34.0)
MCHC: 32.9 g/dL (ref 30.0–36.0)
RDW: 13.9 % (ref 11.5–15.5)

## 2011-12-22 NOTE — Op Note (Signed)
NAMECOLLIER, Margaret NO.:  Cowan  MEDICAL RECORD NO.:  [id number]  LOCATION:  1608                         FACILITY:  [facility]  PHYSICIAN:  Vanita Panda. Magnus Ivan, M.D.DATE OF BIRTH:  [birth year]  DATE OF PROCEDURE:  12/21/2011 DATE OF DISCHARGE:                              OPERATIVE REPORT   PREOPERATIVE DIAGNOSES:  End-stage arthritis and degenerative joint disease, right hip.  POSTOPERATIVE DIAGNOSIS:  End-stage arthritis and degenerative joint disease, right hip.  PROCEDURE:  Right total hip arthroplasty through direct anterior approach.  IMPLANTS:  DePuy Sector Gription acetabular component, size 50, size 32+ 4 neutral polyethylene liner, size 10 Corail femoral component (KLA), size 32+ 5 ceramic hip ball.  SURGEON:  Vanita Panda. Magnus Ivan, MD  ANESTHESIA:  Spinal.  ANTIBIOTICS:  2 g of IV Ancef.  BLOOD LOSS:  600-700 mL.  COMPLICATIONS:  None.  INDICATIONS:  Ms. Bartl is a 61 year old female with debilitating end- stage arthritis involving her right hip.  She has failed conservative treatment and her activities of daily living and mobility, bone quality compromising her pain as daily.  She wished to proceed with a total hip arthroplasty.  Her x-rays show bone-on-bone wear.  She understands risks for acute blood loss anemia, DVT, PE, infection, fracture, nerve injury. The goals are increased mobility and decreased pain as well as increase quality of life.  She does give informed consent for surgery.  PROCEDURE DESCRIPTION:  After informed consent was obtained and appropriate right leg was marked, she was brought to the operating room and spinal anesthesia was obtained.  She was then placed supine on the operating table after a Foley catheter was placed and both feet were placed in in-line skeletal traction using traction boot and the perineal post was put in place.  Her right hip was then prepped and draped with DuraPrep and sterile  drapes.  A C-arm was used to assess the hip center and the center of the pelvis.  A time-out was called and she was identified as correct patient and correct right hip.  I then made an incision just inferior and posterior to the anterior superior iliac spine and carried this obliquely down the leg.  I dissected down to the tensor fascia lata and the tensor fascia was divided longitudinally.  I then proceeded with a direct anterior approach to the hip.  A Cobra retractor was placed around the lateral neck and then up underneath the rectus femoris, a medial retractor was placed.  I cauterized the lateral femoral circumflex vessels and then we removed the side off of the capsule.  I then divided my hip capsule from the superolateral down to the lateral and then across the medial.  I put the Cobra retractor within the joint capsule.  I then used an oscillating saw to cut my femoral neck just proximal to the lesser trochanter and finished this cut with an osteotome.  I then used a corkscrew guide and removed the femoral head in its entirety.  I then cleaned the acetabular debris as well as some marginal osteophytes and then began reaming from a size 42 reamer and 2-mm increments up to a size 50 with  the last two reamers were placed under direct fluoroscopy.  Once I was pleased with the depth of reaming and my inclination and version, I placed the real size 50 Sector Gription acetabular component from DePuy and a single screw.  I placed the apex hole eliminator guide followed by the 32+ 4 neutral polyethylene liner.  Attention was then turned to the femur.  With all traction off the leg, the leg was externally rotated to 90 degrees, extended and adducted.  I placed a Mueller retractor medially and a Bent Hohmann underneath the greater trochanter.  I released the lateral capsule as well as the piriformis and brought the hip up.  I then used a box cutting guide to open the femoral canal and a  rongeur to lateralize. I then began broaching from the size 8 broach up to the size 10 and the 10 was felt to be stable.  First, then I trialed a standard neck and a 32+ 1 hip ball.  We brought the leg back up and over and with traction and internal rotation reduced the hip.  I felt like we needed to increase her offset because I did ream medially.  So, we dislocated the hip and placed a KLA, which was a varus offset neck and trialed a 32+ 5 hip ball.  We reduced this back in the acetabulum and I felt that it was stable and her leg lengths were near equal.  I then re-dislocated the hip and removed all trial components.  I then placed the real size 10 femoral component followed by the real, which was a size 10 KLA femoral component followed by the 32 +5 ceramic hip ball.  We reduced this back in the acetabulum and it was nice and stable.  We irrigated the soft tissues and closed the joint capsule with interrupted #1 Ethibond suture.  I closed the tensor fascia lata with running 0 V-Loc followed by 2-0 Vicryl in the subcutaneous tissue and staples on the skin. Xeroform followed by a well-padded sterile dressing was applied.  She was taken off the table into the recovery room in stable condition.  All final counts were correct.  There were no complications noted.     Vanita Panda. Magnus Ivan, M.D.     CYB/MEDQ  D:  12/21/2011  T:  12/22/2011  Job:  [id number]

## 2011-12-22 NOTE — Progress Notes (Addendum)
[name] spoke with patient concerning dc planning. Pt states active with [facility] for monthly injection. Per pt choice [name] to provide HHPT upon discharge. Pt request [name], 3n1. [name] notified of HH & dme referral via [facility]. Pt states family to assist in home care. DME delivery scheduled to room prior to discharge.Awaiting Md orders for [facility] services. No other needs specified.   Margaret Cowan [phone]

## 2011-12-22 NOTE — Progress Notes (Signed)
Physical Therapy Treatment Note   12/22/11 1400  PT Visit Information  Last PT Received On 12/22/11  Assistance Needed +2  PT Time Calculation  PT Start Time 1329  PT Stop Time 1344  PT Time Calculation (min) 15 min  Subjective Data  Subjective I can't stand it. Can you ask the nurse for a xanax."  Precautions  Precautions None  Restrictions  RLE Weight Bearing WBAT  Cognition  Overall Cognitive Status Appears within functional limits for tasks assessed/performed  Bed Mobility  Bed Mobility Sit to Supine  Sit to Supine 1: +2 Total assist  Sit to Supine: Patient Percentage 30%  Details for Bed Mobility Assistance verbal cues for technique, assist for trunk and R LE 2* to pain  Transfers  Transfers Sit to Stand;Stand to Sit;Stand Pivot Transfers  Sit to Stand 1: +2 Total assist;With upper extremity assist;From chair/3-in-1  Sit to Stand: Patient Percentage 50%  Stand to Sit 1: +2 Total assist;With upper extremity assist;To bed  Stand to Sit: Patient Percentage 50%  Stand Pivot Transfers 1: +2 Total assist  Stand Pivot Transfers: Patient Percentage 50%  Details for Transfer Assistance verbal cues for safe technique, pt requested one person hold back of R knee due to pain so one person assist R LE and other assisted with rise and controlling descent  PT - End of Session  Equipment Utilized During Treatment Gait belt  Activity Tolerance Patient limited by fatigue;Patient limited by pain  Patient left in bed;with call bell/phone within reach;with nursing in room  Nurse Communication (RN in to bring xanax)  PT - Assessment/Plan  Comments on Treatment Session Pt reporting increased R LE pain especially behind R knee and assisted back to bed.  Pt reports torn ligaments and/or meniscus that will eventually require surgery.  Pt reports she has brace at home (only wore for 6 weeks after initially injuring knee in February) and will have son bring it in.  Pt may need ST-SNF prior to home due  to increased pain and limited mobility.  PT Plan Discharge plan needs to be updated;Frequency remains appropriate  Follow Up Recommendations Skilled nursing facility  Equipment Recommended Rolling walker with 5" wheels  Acute Rehab PT Goals  PT Goal: Sit to Supine/Side - Progress Progressing toward goal  PT Goal: Sit to Stand - Progress Progressing toward goal  PT Goal: Stand to Sit - Progress Progressing toward goal  PT General Charges  $$ ACUTE PT VISIT 1 Procedure  PT Treatments  $Therapeutic Activity 8-22 mins    Zenovia Jarred, PT Pager: [phone]

## 2011-12-22 NOTE — Evaluation (Signed)
Physical Therapy Evaluation Patient Details Name: Margaret Cowan MRN: [id number] DOB: [birth year] Today's Date: 12/22/2011 Time: 4098-1191 PT Time Calculation (min): 32 min  PT Assessment / Plan / Recommendation Clinical Impression  Pt s/p R direct anterior THR.  Pt would benefit from acute PT services in order to improve independence with transfers and ambulation to prepare for d/c.  Pt plans on d/c home and reports daughter in law may be able to stay with her.  Evaluation limited by increased posterior R knee pain and nausea with mobility.    PT Assessment  Patient needs continued PT services    Follow Up Recommendations  Home health PT;Supervision for mobility/OOB    Barriers to Discharge Decreased caregiver support      Equipment Recommendations  Rolling walker with 5" wheels    Recommendations for Other Services     Frequency 7X/week    Precautions / Restrictions Precautions Precautions: None Restrictions RLE Weight Bearing: Weight bearing as tolerated   Pertinent Vitals/Pain Pain increased to 10/10 posterior R knee with ambulation, repositioned, PCA encouraged      Mobility  Bed Mobility Bed Mobility: Supine to Sit Supine to Sit: HOB elevated;With rails;4: Min assist Details for Bed Mobility Assistance: verbal cues for technique, increased time, assist for R LE Transfers Transfers: Stand to Sit;Sit to Stand Sit to Stand: From elevated surface;From bed;With upper extremity assist;4: Min assist Stand to Sit: To chair/3-in-1;4: Min assist;With upper extremity assist Details for Transfer Assistance: verbal cues for safe technique, pt reports increased pain behind knee with transfers Ambulation/Gait Ambulation/Gait Assistance: 4: Min assist Ambulation Distance (Feet): 5 Feet Assistive device: Rolling walker Ambulation/Gait Assistance Details: verbal cues for safe technique and use of RW, pt with increased R LE pain in back of leg and nauseated so brought recliner behind  pt, pt denies dizziness with mobility Gait Pattern: Step-to pattern;Trunk flexed;Antalgic    Exercises     PT Diagnosis: Difficulty walking;Acute pain  PT Problem List: Decreased strength;Decreased activity tolerance;Decreased mobility;Pain;Decreased safety awareness;Decreased knowledge of use of DME PT Treatment Interventions: DME instruction;Gait training;Functional mobility training;Therapeutic activities;Therapeutic exercise;Patient/family education   PT Goals Acute Rehab PT Goals PT Goal Formulation: With patient Time For Goal Achievement: 12/29/11 Potential to Achieve Goals: Good Pt will go Supine/Side to Sit: with supervision PT Goal: Supine/Side to Sit - Progress: Goal set today Pt will go Sit to Supine/Side: with supervision PT Goal: Sit to Supine/Side - Progress: Goal set today Pt will go Sit to Stand: with supervision PT Goal: Sit to Stand - Progress: Goal set today Pt will go Stand to Sit: with supervision PT Goal: Stand to Sit - Progress: Goal set today Pt will Ambulate: >150 feet;with supervision;with least restrictive assistive device PT Goal: Ambulate - Progress: Goal set today  Visit Information  Last PT Received On: 12/22/11 Assistance Needed: +2    Subjective Data  Subjective: "I can't move my R leg"   Prior Functioning  Home Living Lives With: Alone Type of Home: Apartment Home Access: Level entry Home Layout: One level Bathroom Shower/Tub: Engineer, manufacturing systems: Standard Home Adaptive Equipment: Other (comment);Crutches;Wheelchair - Architectural technologist with back (rollator) Prior Function Level of Independence: Independent with assistive device(s) Comments: Pt was using crutches prior to surgery since February when she sprained her knee.    Cognition  Overall Cognitive Status: Appears within functional limits for tasks assessed/performed Arousal/Alertness: Awake/alert Orientation Level: Appears intact for tasks assessed Behavior During  Session: [facility] for tasks performed    Extremity/Trunk Assessment  Right Upper Extremity Assessment RUE ROM/Strength/Tone: [facility] for tasks assessed Left Upper Extremity Assessment LUE ROM/Strength/Tone: [facility] for tasks assessed Right Lower Extremity Assessment RLE ROM/Strength/Tone: Deficits;Unable to fully assess;Due to pain RLE ROM/Strength/Tone Deficits: pt reporting increased R knee pain with mobility (hx of sprained ligaments) and unable to move LE without assist Left Lower Extremity Assessment LLE ROM/Strength/Tone: [facility] for tasks assessed   Balance    End of Session PT - End of Session Equipment Utilized During Treatment: Gait belt Activity Tolerance: Patient limited by fatigue;Patient limited by pain Patient left: in chair;with call bell/phone within reach Nurse Communication: Other (comment) (pt in chair and applied CPAP )  GP     Margaret Cowan,Margaret Cowan 12/22/2011, 12:33 PM Pager: [phone]

## 2011-12-22 NOTE — Progress Notes (Signed)
Subjective: 1 Day Post-Op Procedure(s) (LRB): TOTAL HIP ARTHROPLASTY ANTERIOR APPROACH (Right) Patient reports pain as moderate to severe in thigh.   Objective: Vital signs in last 24 hours: Temp:  [97.3 F (36.3 C)-98.9 F (37.2 C)] 98.4 F (36.9 C) (09/21 0603) Pulse Rate:  [59-87] 87  (09/21 0603) Resp:  [10-24] 20  (09/21 0800) BP: (94-135)/(44-79) 115/59 mmHg (09/21 0603) SpO2:  [95 %-100 %] 98 % (09/21 0800) FiO2 (%):  [100 %] 100 % (09/20 1937) Weight:  [89.812 kg (198 lb)] 89.812 kg (198 lb) (09/20 1445)  Intake/Output from previous day: 09/20 0701 - 09/21 0700 In: 2693.8 [P.O.:360; I.V.:2278.8; IV Piggyback:55] Out: 2030 [Urine:1330; Blood:700] Intake/Output this shift: Total I/O In: -  Out: 300 [Urine:300]   Basename 12/22/11 0454  HGB 8.3*    WAS 10.9  HGB ON ADMISSION  Basename 12/22/11 0454  WBC 6.5  RBC 2.89*  HCT 25.2*  PLT 192    Basename 12/22/11 0454  NA 136  K 4.0  CL 103  CO2 29  BUN 5*  CREATININE 0.54  GLUCOSE 119*  CALCIUM 7.9*   No results found for this basename: LABPT:2,INR:2 in the last 72 hours  Neurologically intact  Assessment/Plan: 1 Day Post-Op Procedure(s) (LRB): TOTAL HIP ARTHROPLASTY ANTERIOR APPROACH (Right) Up with therapy   D/C   Foley.  .     Was on Dilaudid pre-op  For chronic pain, will leave PCA until tomorrow. Will check hgb in AM  ,  Dilution vs anemia from blood loss.   Margaret Cowan C 12/22/2011, 10:40 AM

## 2011-12-23 LAB — CBC
MCH: 28.4 pg (ref 26.0–34.0)
MCHC: 33 g/dL (ref 30.0–36.0)
Platelets: 176 10*3/uL (ref 150–400)
RDW: 14.2 % (ref 11.5–15.5)

## 2011-12-23 MED ORDER — LOPERAMIDE HCL 2 MG PO CAPS
2.0000 mg | ORAL_CAPSULE | Freq: Once | ORAL | Status: AC
  Administered 2011-12-23: 2 mg via ORAL
  Filled 2011-12-23: qty 1

## 2011-12-23 NOTE — Progress Notes (Signed)
Physical Therapy Treatment Patient Details Name: Margaret Cowan MRN: [id number] DOB: 04/19/1950 Today's Date: 12/23/2011 Time: 7829-5621 PT Time Calculation (min): 8 min  PT Assessment / Plan / Recommendation Comments on Treatment Session  Pt reported her dressing came off, RN notified. Assisted pt OOB to stand with RW to remove underwear, then back to bed. Pt declined ambulation, agreed to walk later. Will follow. Continued R knee pain, pt states she has a brace for R knee but it doesn't fit properly.    Follow Up Recommendations       Barriers to Discharge        Equipment Recommendations  Rolling walker with 5" wheels;3 in 1 bedside comode    Recommendations for Other Services    Frequency 7X/week   Plan Discharge plan remains appropriate    Precautions / Restrictions Precautions Precautions: None Restrictions Weight Bearing Restrictions: No RLE Weight Bearing: Weight bearing as tolerated   Pertinent Vitals/Pain **c/o R knee pain, RN notified*    Mobility  Bed Mobility Bed Mobility: Supine to Sit;Sit to Supine;Sitting - Scoot to Edge of Bed Supine to Sit: HOB flat;3: Mod assist Sitting - Scoot to Edge of Bed: 5: Supervision Sit to Supine: 4: Min assist;With rail Details for Bed Mobility Assistance: pt required sheet for leg lifter Transfers Sit to Stand: With upper extremity assist;From bed;4: Min guard Stand to Sit: With upper extremity assist;To bed;4: Min guard Details for Transfer Assistance: mod v/c for hand placement. Pt wants to pull up on RW, VCs to kick out RLE prior to sitting Ambulation/Gait Ambulation/Gait Assistance: Not tested (comment) General Gait Details: pt declined ambulation, awaiting meds, pt agreed to ambulate later    Exercises     PT Diagnosis:    PT Problem List:   PT Treatment Interventions:     PT Goals Acute Rehab PT Goals PT Goal Formulation: With patient Time For Goal Achievement: 12/29/11 Potential to Achieve Goals: Good Pt will  go Supine/Side to Sit: with supervision PT Goal: Supine/Side to Sit - Progress: Progressing toward goal Pt will go Sit to Supine/Side: with supervision PT Goal: Sit to Supine/Side - Progress: Progressing toward goal Pt will go Sit to Stand: with supervision PT Goal: Sit to Stand - Progress: Progressing toward goal Pt will go Stand to Sit: with supervision PT Goal: Stand to Sit - Progress: Progressing toward goal Pt will Ambulate: >150 feet;with supervision;with least restrictive assistive device  Visit Information  Last PT Received On: 12/23/11 Assistance Needed: +2    Subjective Data  Subjective: I need to take my panties off, they're rubbing my incision.    Cognition  Overall Cognitive Status: Appears within functional limits for tasks assessed/performed Arousal/Alertness: Awake/alert Orientation Level: Appears intact for tasks assessed Behavior During Session: [facility] for tasks performed    Balance     End of Session PT - End of Session Activity Tolerance: Patient limited by pain Patient left: in bed;with call bell/phone within reach;with nursing in room Nurse Communication: Mobility status   GP     Ralene Bathe Kistler 12/23/2011, 10:19 AM [phone]

## 2011-12-23 NOTE — Progress Notes (Signed)
Clinical Social Work Department BRIEF PSYCHOSOCIAL ASSESSMENT 12/23/2011  Patient:  Margaret Cowan, Margaret Cowan     Account Number:  [id number]     Admit date:  12/21/2011  Clinical Social Worker:  Leron Croak, CLINICAL SOCIAL WORKER  Date/Time:  12/23/2011 01:36 PM  Referred by:  Physician  Date Referred:  12/21/2011 Referred for  SNF Placement   Other Referral:   Interview type:  Patient Other interview type:    PSYCHOSOCIAL DATA Living Status:  ALONE Admitted from facility:   Level of care:   Primary support name:  Rocco Serene Primary support relationship to patient:  CHILD, ADULT Degree of support available:    CURRENT CONCERNS Current Concerns  Post-Acute Placement   Other Concerns:    SOCIAL WORK ASSESSMENT / PLAN CSW met with the Pt at the bedside. Pt was aware that PT had recommended SNF placement for rehab, however has chosen to go home with HHPT instead. Pt currently has [facility] coming out  to the home and would like to keep that agency and increase the hours. CM has already been to visit the Pt and has assisted with d/c planning.  CSW signing off.   Assessment/plan status:  Information/Referral to [facility] Other assessment/ plan:   Information/referral to community resources:   CSW attempted to provide the Pt with SNF listing, should she need in the future, but the Pt refused information. CSW encouraged Pt to contact CSW for any further assistance if needed.    PATIENT'S/FAMILY'S RESPONSE TO PLAN OF CARE: Pt appreciative for assistance with d/c planning and with contacting CM for [facility] information.        Leron Croak, LCSWA [employer] [phone]
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
## 2011-12-23 NOTE — Evaluation (Signed)
Occupational Therapy Evaluation Patient Details Name: Margaret Cowan MRN: [id number] DOB: May 04, 1950 Today's Date: 12/23/2011 Time: 7829-5621 OT Time Calculation (min): 24 min  OT Assessment / Plan / Recommendation Clinical Impression  61 yo female admitted for Rt anterior hip that could benefit from skilled OT acutely. Recommend HHOT for d/c planning    OT Assessment  Patient needs continued OT Services    Follow Up Recommendations  Home health OT    Barriers to Discharge      Equipment Recommendations  Rolling walker with 5" wheels;3 in 1 bedside comode    Recommendations for Other Services    Frequency  Min 2X/week    Precautions / Restrictions Precautions Precautions: None Restrictions Weight Bearing Restrictions: No RLE Weight Bearing: Weight bearing as tolerated   Pertinent Vitals/Pain C/o pain at Rt knee Provided ace wrap to knee pt reports decr pain    ADL  Toilet Transfer: Performed;Supervision/safety Toilet Transfer Method: Sit to Barista: Regular height toilet;Grab bars Toileting - Clothing Manipulation and Hygiene: Performed;Supervision/safety Where Assessed - Engineer, mining and Hygiene: Sit to stand from 3-in-1 or toilet Equipment Used: Rolling walker (provided ace wrap to Rt knee) Transfers/Ambulation Related to ADLs: Pt ambulated S level using pants to lift Rt LE with UE.  ADL Comments: Pt educated on bed mobility using a sheet as a leg lifter. Pt demonstartes bed mobility with Mod (A). Pt reports dressing indep this AM and tech report that this is true. Pt completed UB and LB dressing MOD i    OT Diagnosis: Generalized weakness;Acute pain  OT Problem List: Decreased strength;Decreased activity tolerance;Impaired balance (sitting and/or standing);Decreased cognition;Decreased safety awareness;Decreased knowledge of use of DME or AE;Decreased knowledge of precautions OT Treatment Interventions: Self-care/ADL  training;DME and/or AE instruction;Therapeutic activities;Patient/family education;Balance training   OT Goals Acute Rehab OT Goals OT Goal Formulation: With patient Time For Goal Achievement: 01/06/12 Potential to Achieve Goals: Good ADL Goals Pt Will Perform Grooming: Independently;Standing at sink ADL Goal: Grooming - Progress: Goal set today Pt Will Transfer to Toilet: Independently;Ambulation;3-in-1 ADL Goal: Toilet Transfer - Progress: Goal set today Pt Will Perform Toileting - Clothing Manipulation: Independently;Sitting on 3-in-1 or toilet ADL Goal: Toileting - Clothing Manipulation - Progress: Goal set today Pt Will Perform Toileting - Hygiene: Independently;Sitting on 3-in-1 or toilet ADL Goal: Toileting - Hygiene - Progress: Goal set today Miscellaneous OT Goals Miscellaneous OT Goal #1: Pt will perform bed mobility Independently as precursor for adls OT Goal: Miscellaneous Goal #1 - Progress: Goal set today  Visit Information  Last OT Received On: 12/23/11 Assistance Needed: +2    Subjective Data  Subjective: "I need my knee operated on and I can't do it yet because they said I had to do this hip first"- pt providing a long explaination of knee pain Patient Stated Goal: to get knee operated on   Prior Functioning  Vision/Perception  Home Living Lives With: Alone Available Help at Discharge: Family Type of Home: Apartment Home Access: Level entry Home Layout: One level Bathroom Shower/Tub: Engineer, manufacturing systems: Standard Home Adaptive Equipment: Other (comment);Crutches;Wheelchair - Architectural technologist with back Prior Function Level of Independence: Independent with assistive device(s) Comments: Pt was using crutches prior to surgery since February when she sprained her knee Communication Communication: No difficulties Dominant Hand: Right      Cognition  Overall Cognitive Status: Appears within functional limits for tasks  assessed/performed Arousal/Alertness: Awake/alert Orientation Level: Appears intact for tasks assessed Behavior During Session: [facility] for  tasks performed    Extremity/Trunk Assessment Right Upper Extremity Assessment RUE ROM/Strength/Tone: [facility] for tasks assessed Left Upper Extremity Assessment LUE ROM/Strength/Tone: [facility] for tasks assessed   Mobility  Shoulder Instructions  Bed Mobility Bed Mobility: Supine to Sit;Sit to Supine;Sitting - Scoot to Edge of Bed Supine to Sit: HOB flat;3: Mod assist Sitting - Scoot to Edge of Bed: 5: Supervision Sit to Supine: 4: Min assist;With rail Details for Bed Mobility Assistance: pt required sheet for leg lifter Transfers Transfers: Sit to Stand;Stand to Sit Sit to Stand: 5: Supervision;With upper extremity assist;From chair/3-in-1 Stand to Sit: 5: Supervision;With upper extremity assist;To bed Details for Transfer Assistance: mod v/c for hand placement. Pt wants to pull up on RW       Exercise     Balance     End of Session OT - End of Session Activity Tolerance: Patient tolerated treatment well Patient left: in bed;with call bell/phone within reach Nurse Communication: Mobility status  GO     Harrel Carina [facility] 12/23/2011, 10:03 AM Pager: [phone]

## 2011-12-23 NOTE — Progress Notes (Addendum)
Subjective: 2 Days Post-Op Procedure(s) (LRB): TOTAL HIP ARTHROPLASTY ANTERIOR APPROACH (Right) Patient reports pain as severe.   Patient anxious C/O pain 10 out of 10 in knee.  Crying .   Objective: Vital signs in last 24 hours: Temp:  [98.3 F (36.8 C)-99.8 F (37.7 C)] 98.3 F (36.8 C) (09/22 1020) Pulse Rate:  [81-99] 90  (09/22 1020) Resp:  [16-20] 17  (09/22 1020) BP: (101-113)/(54-67) 113/67 mmHg (09/22 1020) SpO2:  [94 %-100 %] 94 % (09/22 1020) FiO2 (%):  [100 %] 100 % (09/21 1200)  Intake/Output from previous day: 09/21 0701 - 09/22 0700 In: 240 [P.O.:240] Out: 1450 [Urine:1450] Intake/Output this shift: Total I/O In: 240 [P.O.:240] Out: -    Basename 12/23/11 0450 12/22/11 0454  HGB 7.7* 8.3*    Basename 12/23/11 0450 12/22/11 0454  WBC 8.3 6.5  RBC 2.71* 2.89*  HCT 23.3* 25.2*  PLT 176 192    Basename 12/22/11 0454  NA 136  K 4.0  CL 103  CO2 29  BUN 5*  CREATININE 0.54  GLUCOSE 119*  CALCIUM 7.9*   No results found for this basename: LABPT:2,INR:2 in the last 72 hours  Neurologically intact  Assessment/Plan: 2 Days Post-Op Procedure(s) (LRB): TOTAL HIP ARTHROPLASTY ANTERIOR APPROACH (Right) Up with therapy  .   After stop crying she asked " when can I go home".    Has been getting OOB on her on. LL equal . Likely home in AM  Bentley Haralson C 12/23/2011, 11:38 AM

## 2011-12-24 ENCOUNTER — Encounter (HOSPITAL_COMMUNITY): Payer: Self-pay | Admitting: Orthopaedic Surgery

## 2011-12-24 LAB — CBC
Platelets: 198 10*3/uL (ref 150–400)
RBC: 2.69 MIL/uL — ABNORMAL LOW (ref 3.87–5.11)
RDW: 14.2 % (ref 11.5–15.5)
WBC: 12.9 10*3/uL — ABNORMAL HIGH (ref 4.0–10.5)

## 2011-12-24 MED ORDER — LOPERAMIDE HCL 2 MG PO CAPS
2.0000 mg | ORAL_CAPSULE | ORAL | Status: DC | PRN
  Administered 2011-12-24 (×4): 2 mg via ORAL
  Filled 2011-12-24 (×5): qty 1

## 2011-12-24 NOTE — Progress Notes (Signed)
Physical Therapy Treatment Patient Details Name: Margaret Cowan MRN: [id number] DOB: Dec 09, 1950 Today's Date: 12/24/2011 Time: 4782-9562 PT Time Calculation (min): 26 min  PT Assessment / Plan / Recommendation Comments on Treatment Session  Pt assisted to [facility] (having frequent stools last night and this morning) and then performed exercises.  Pt starting blood transfusion upon leaving room, so will attempt to return later today to ambulate.    Follow Up Recommendations  Skilled nursing facility    Barriers to Discharge        Equipment Recommendations  Rolling walker with 5" wheels;3 in 1 bedside comode    Recommendations for Other Services    Frequency     Plan Discharge plan remains appropriate;Frequency remains appropriate    Precautions / Restrictions Precautions Precautions: None Restrictions RLE Weight Bearing: Weight bearing as tolerated   Pertinent Vitals/Pain 2/10 R hip pain, premedicated prior to arrival, repositioned    Mobility  Bed Mobility Bed Mobility: Sit to Supine Sit to Supine: 4: Min assist;HOB elevated Details for Bed Mobility Assistance: assist for R LE support, slow transfer as pt afraid of pain Transfers Transfers: Sit to Stand;Stand to Sit;Stand Pivot Transfers Sit to Stand: 4: Min guard;With upper extremity assist;From bed;From chair/3-in-1 Stand to Sit: 4: Min guard;With upper extremity assist;To chair/3-in-1;To bed Stand Pivot Transfers: 4: Min guard Details for Transfer Assistance: verbal cues for safe technique and not to reach out for tray table as it has wheels but to use bed and RW when rising (pt rushing to get to [facility]) pt reports slight dizziness with transfers Ambulation/Gait Ambulation/Gait Assistance: Not tested (comment) (deferred as pt to get blood)    Exercises Total Joint Exercises Ankle Circles/Pumps: AROM;Both;20 reps;Seated Quad Sets: AROM;Strengthening;Both;20 reps;Supine Short Arc Quad: Strengthening;Right;15  reps;Seated;AAROM Hip ABduction/ADduction: AAROM;Strengthening;Right;15 reps;Supine Straight Leg Raises: AAROM;Strengthening;Right;15 reps;Supine Knee Flexion: AROM;Strengthening;Right;15 reps;Seated   PT Diagnosis:    PT Problem List:   PT Treatment Interventions:     PT Goals Acute Rehab PT Goals PT Goal: Sit to Supine/Side - Progress: Progressing toward goal PT Goal: Sit to Stand - Progress: Progressing toward goal PT Goal: Stand to Sit - Progress: Progressing toward goal  Visit Information  Last PT Received On: 12/24/11 Assistance Needed: +1    Subjective Data  Subjective: My knee is feeling better.  They said I'd get a steroid injection at follow up.   Cognition  Overall Cognitive Status: Appears within functional limits for tasks assessed/performed    Balance     End of Session PT - End of Session Activity Tolerance: Patient limited by pain Patient left: in bed;with call bell/phone within reach;with nursing in room   GP     [facility] 12/24/2011, 12:02 PM Pager: [phone]

## 2011-12-24 NOTE — Progress Notes (Signed)
Subjective: 3 Days Post-Op Procedure(s) (LRB): TOTAL HIP ARTHROPLASTY ANTERIOR APPROACH (Right) Patient reports pain as moderate.  Having loose stools.  Symptomatic acute blood loss anemia . Objective: Vital signs in last 24 hours: Temp:  [98.3 F (36.8 C)-99.3 F (37.4 C)] 98.8 F (37.1 C) (09/23 0610) Pulse Rate:  [72-105] 78  (09/23 0610) Resp:  [16-20] 20  (09/23 0610) BP: (78-114)/(35-67) 108/45 mmHg (09/23 0635) SpO2:  [94 %-100 %] 97 % (09/23 0610)  Intake/Output from previous day: 09/22 0701 - 09/23 0700 In: 240 [P.O.:240] Out: 1250 [Urine:1250] Intake/Output this shift: Total I/O In: -  Out: 1250 [Urine:1250]   Basename 12/24/11 0357 12/23/11 0450 12/22/11 0454  HGB 7.5* 7.7* 8.3*    Basename 12/24/11 0357 12/23/11 0450  WBC 12.9* 8.3  RBC 2.69* 2.71*  HCT 23.3* 23.3*  PLT 198 176    Basename 12/22/11 0454  NA 136  K 4.0  CL 103  CO2 29  BUN 5*  CREATININE 0.54  GLUCOSE 119*  CALCIUM 7.9*   No results found for this basename: LABPT:2,INR:2 in the last 72 hours  Sensation intact distally Intact pulses distally Dorsiflexion/Plantar flexion intact Incision: scant drainage  Assessment/Plan: 3 Days Post-Op Procedure(s) (LRB): TOTAL HIP ARTHROPLASTY ANTERIOR APPROACH (Right) Up with therapy Plan for discharge tomorrow Transfusion 1 unit PRBC today. Imodium  Kathryne Hitch 12/24/2011, 6:49 AM

## 2011-12-24 NOTE — Progress Notes (Signed)
Patient has been I&O cath times two in last 12 hours. Current bladder scan shows ~60 mL. Patient states she has no urge to void, and does not feel full. VS have been stable, until most recent set taken at 0630.  BP= 70/40s with Dynamap. Currently, NT taking manual BP. Reading=  108/45 . Patient has had numerous bouts of diarrhea during the night but states she has not voided during these episodes. She has had a history of chronic catheterization approximately 2005, and bladder Tacking as well. Hgb this AM= 7.5. Dr. Magnus Ivan makes early rounds, so no call/page necessary. Will inform upon his arrival. Closely monitoring patient and will notify MD of any urgent changes. Margaret Cowan, [place] 6:37 AM 12/24/2011

## 2011-12-24 NOTE — Progress Notes (Signed)
Physical Therapy Treatment Note   12/24/11 1400  PT Visit Information  Last PT Received On 12/24/11  Assistance Needed +1  PT Time Calculation  PT Start Time 1408  PT Stop Time 1424  PT Time Calculation (min) 16 min  Subjective Data  Subjective Once I use my upper body to push through the walker it's much easier to walk.  Precautions  Precautions None  Restrictions  RLE Weight Bearing WBAT  Cognition  Overall Cognitive Status Appears within functional limits for tasks assessed/performed  Bed Mobility  Bed Mobility Supine to Sit;Sit to Supine  Supine to Sit 4: Min assist;HOB elevated  Sit to Supine HOB elevated;5: Set up  Details for Bed Mobility Assistance assist for R LE off bed, used sheet to bring LE onto bed  Transfers  Transfers Sit to Stand;Stand to Sit;Stand Pivot Transfers  Sit to Stand 4: Min guard;With upper extremity assist;From bed;From chair/3-in-1  Stand to Sit 4: Min guard;With upper extremity assist;To chair/3-in-1;To bed  Stand Pivot Transfers 4: Min guard  Details for Transfer Assistance verbal cues for safe technique  Ambulation/Gait  Ambulation/Gait Assistance 4: Min guard  Ambulation Distance (Feet) 60 Feet  Assistive device Rolling walker  Ambulation/Gait Assistance Details verbal cues for sequence, pt feels much better with decreased weight through R LE  Gait Pattern Step-to pattern;Trunk flexed;Antalgic  PT - End of Session  Activity Tolerance Patient limited by fatigue  Patient left in bed;with call bell/phone within reach  PT - Assessment/Plan  Comments on Treatment Session Pt just finished with receiving blood so ambulated in hallway and pt denies dizziness with mobility.  PT Plan Discharge plan remains appropriate;Frequency remains appropriate  Follow Up Recommendations Home health PT;Supervision for mobility/OOB  Equipment Recommended Rolling walker with 5" wheels;3 in 1 bedside comode  Acute Rehab PT Goals  PT Goal: Supine/Side to Sit -  Progress Progressing toward goal  PT Goal: Sit to Supine/Side - Progress Progressing toward goal  PT Goal: Sit to Stand - Progress Progressing toward goal  PT Goal: Stand to Sit - Progress Progressing toward goal  PT Goal: Ambulate - Progress Progressing toward goal  PT General Charges  $$ ACUTE PT VISIT 1 Procedure  PT Treatments  $Gait Training 8-22 mins    Zenovia Jarred, PT Pager: [phone]

## 2011-12-24 NOTE — Progress Notes (Addendum)
Pt had verbalized discomfort to abdomen  around 5pm after BSC use. Bladder scan rechecked again=465cc. Foley cath reinserted for urinary retention

## 2011-12-24 NOTE — Progress Notes (Signed)
Occupational Therapy Note Chart reviewed. Note pt getting 1 unit of blood. Will check back later today or in am. Judithann Sauger OTR/L [phone] 12/24/2011

## 2011-12-24 NOTE — Progress Notes (Signed)
Pt was bladder scanned around 1215=142cc. Had noted voiding small amounts with stools. Repeat Bladder scan done around 1530 when done using BSC= 112cc. Does not warrant F/C reinsertion at this time. Continue to monitor.

## 2011-12-25 LAB — TYPE AND SCREEN
Antibody Screen: NEGATIVE
Unit division: 0

## 2011-12-25 MED ORDER — ASPIRIN 325 MG PO TBEC: 325.0000 mg | DELAYED_RELEASE_TABLET | Freq: Every day | ORAL | Status: DC

## 2011-12-25 MED ORDER — HYDROMORPHONE HCL 4 MG PO TABS: 4.0000 mg | ORAL_TABLET | ORAL | Status: DC | PRN

## 2011-12-25 MED ORDER — METHOCARBAMOL 500 MG PO TABS: 500.0000 mg | ORAL_TABLET | Freq: Four times a day (QID) | ORAL | Status: DC | PRN

## 2011-12-25 MED ORDER — FERROUS SULFATE 325 (65 FE) MG PO TABS: 325.0000 mg | ORAL_TABLET | Freq: Three times a day (TID) | ORAL | Status: DC

## 2011-12-25 MED ORDER — DOXYCYCLINE HYCLATE 100 MG PO TABS: 100.0000 mg | ORAL_TABLET | Freq: Two times a day (BID) | ORAL | Status: DC

## 2011-12-25 NOTE — Discharge Summary (Signed)
Patient ID: Margaret Cowan MRN: [id number] DOB/AGE: Jan 04, 1951 61 y.o.  Admit date: 12/21/2011 Discharge date: 12/25/2011  Admission Diagnoses:  Principal Problem:  *Degenerative arthritis of hip   Discharge Diagnoses:  Same  Past Medical History  Diagnosis Date  . Chest pain   . Colonic adenoma   . Abdominal wall pain   . Hepatic cyst   . Hepatic hemangioma   . Chronic low back pain   . Anxiety   . CHF (congestive heart failure)   . Hypertension   . Hemorrhoids, internal   . Chronic pain   . IBS (irritable bowel syndrome)   . Constipation   . GERD (gastroesophageal reflux disease)   . Schizoaffective disorder, unspecified condition   . Hypothyroidism   . Sleep apnea     cpap settings   . Headache   . Fibromyalgia     neuropathy , cervical disc at c3-5   . Cancer     hx of cancer with hysterectomy , mole removed from left knee   . Arthritis   . Anemia     Surgeries: Procedure(s): TOTAL HIP ARTHROPLASTY ANTERIOR APPROACH on 12/21/2011   Consultants:    Discharged Condition: Improved  Hospital Course: LAURENA FENTER is an 61 y.o. female who was admitted 12/21/2011 for operative treatment ofDegenerative arthritis of hip. Patient has severe unremitting pain that affects sleep, daily activities, and work/hobbies. After pre-op clearance the patient was taken to the operating room on 12/21/2011 and underwent  Procedure(s): TOTAL HIP ARTHROPLASTY ANTERIOR APPROACH.    Patient was given perioperative antibiotics: Anti-infectives     Start     Dose/Rate Route Frequency Ordered Stop   12/25/11 0000   doxycycline (VIBRA-TABS) 100 MG tablet        100 mg Oral 2 times daily 12/25/11 0659     12/21/11 1830   ceFAZolin (ANCEF) IVPB 1 g/50 mL premix        1 g 100 mL/hr over 30 Minutes Intravenous Every 6 hours 12/21/11 1524 12/22/11 0043   12/21/11 0745   ceFAZolin (ANCEF) IVPB 2 g/50 mL premix        2 g 100 mL/hr over 30 Minutes Intravenous 60 min pre-op 12/21/11 0745  12/21/11 1024           Patient was given sequential compression devices, early ambulation, and chemoprophylaxis to prevent DVT.  Patient benefited maximally from hospital stay and there were no complications.    Recent vital signs: Patient Vitals for the past 24 hrs:  BP Temp Temp src Pulse Resp SpO2  12/25/11 0459 94/54 mmHg 98.2 F (36.8 C) Oral 73  16  96 %  12/24/11 2259 96/48 mmHg - - - - -  12/24/11 2111 91/52 mmHg 98.3 F (36.8 C) Oral 84  16  98 %  12/24/11 1541 - - - - 16  96 %  12/24/11 1445 118/70 mmHg 97.8 F (36.6 C) Oral 80  16  92 %  12/24/11 1415 98/58 mmHg 98.3 F (36.8 C) Oral 73  16  -  12/24/11 1400 93/52 mmHg 97.9 F (36.6 C) Oral 71  16  -  12/24/11 1300 98/63 mmHg 98.3 F (36.8 C) Oral 77  18  -  12/24/11 1200 101/70 mmHg 98.9 F (37.2 C) Oral 80  18  97 %  12/24/11 1145 102/60 mmHg 98.5 F (36.9 C) Oral 78  18  -  12/24/11 0800 - - - - 18  97 %  Recent laboratory studies:  Basename 12/24/11 0357 January 08, 2012 0450  WBC 12.9* 8.3  HGB 7.5* 7.7*  HCT 23.3* 23.3*  PLT 198 176  NA -- --  K -- --  CL -- --  CO2 -- --  BUN -- --  CREATININE -- --  GLUCOSE -- --  INR -- --  CALCIUM -- --     Discharge Medications:     Medication List     As of 12/25/2011  6:59 AM    TAKE these medications         ALIGN 4 MG Caps   Take 1 capsule by mouth daily.      alprazolam 2 MG tablet   Commonly known as: XANAX   Take 2 mg by mouth 4 (four) times daily as needed. anxiety      amLODipine 5 MG tablet   Commonly known as: NORVASC   Take 5 mg by mouth 2 (two) times daily.      aspirin 325 MG EC tablet   Take 1 tablet (325 mg total) by mouth daily.      colesevelam 625 MG tablet   Commonly known as: WELCHOL   Take 1,875 mg by mouth 2 (two) times daily with a meal.      CYANOCOBALAMIN IJ   Inject 1,000 mg as directed every 30 (thirty) days.      dicyclomine 10 MG capsule   Commonly known as: BENTYL   Take 10 mg by mouth as needed.       doxycycline 100 MG tablet   Commonly known as: VIBRA-TABS   Take 1 tablet (100 mg total) by mouth 2 (two) times daily.      escitalopram 10 MG tablet   Commonly known as: LEXAPRO   Take 10 mg by mouth daily with breakfast.      esomeprazole 40 MG capsule   Commonly known as: NEXIUM   Take 40 mg by mouth daily.      ferrous sulfate 325 (65 FE) MG tablet   Take 1 tablet (325 mg total) by mouth 3 (three) times daily after meals.      Fish Oil 1000 MG Caps   Take 1 capsule by mouth 3 (three) times daily.      HYDROmorphone 4 MG tablet   Commonly known as: DILAUDID   Take 1 tablet (4 mg total) by mouth every 4 (four) hours as needed. Pain      levothyroxine 25 MCG tablet   Commonly known as: SYNTHROID, LEVOTHROID   Take 25 mcg by mouth daily before breakfast.      lisinopril 40 MG tablet   Commonly known as: PRINIVIL,ZESTRIL   Take 40 mg by mouth daily before lunch.      meloxicam 7.5 MG tablet   Commonly known as: MOBIC   Take 7.5 mg by mouth daily. As needed      methocarbamol 500 MG tablet   Commonly known as: ROBAXIN   Take 1 tablet (500 mg total) by mouth every 6 (six) hours as needed.      polyethylene glycol packet   Commonly known as: MIRALAX / GLYCOLAX   Take 17 g by mouth daily as needed. constipation      promethazine 25 MG tablet   Commonly known as: PHENERGAN   Take 25 mg by mouth daily as needed. Nausea      psyllium 0.52 G capsule   Commonly known as: REGULOID   Take 0.52 g by mouth daily. Pt takes  5 capsules daily at 1700pm        Diagnostic Studies: Dg Chest 2 View  12/17/2011  *RADIOLOGY REPORT*  Clinical Data: Hypertension  CHEST - 2 VIEW  Comparison: 09/15/2010  Findings: Heart size is normal.  No pleural effusion or edema.  No airspace consolidation identified.  Review of the visualized osseous structures is unremarkable.  IMPRESSION:  1.  No active cardiopulmonary abnormalities.   Original Report Authenticated By: Rosealee Albee, M.D.    Dg  Hip Complete Right  12/21/2011  *RADIOLOGY REPORT*  Clinical Data: Right-sided total hip replacement.  RIGHT HIP - COMPLETE 2+ VIEW  Comparison: No priors.  Findings: Three intraoperative fluoroscopic spot views demonstrate placement of a right total hip arthroplasty.  Both the femoral head and the acetabular components of the prosthesis appear to be well seated, without definite periprostatic fracture or other immediate complicating features.  The prosthetic femoral head appears to be properly located.  IMPRESSION: 1.  Postoperative changes of right total hip arthroplasty without immediate complicating features, as above.   Original Report Authenticated By: Florencia Reasons, M.D.    Dg Pelvis Portable  12/21/2011  *RADIOLOGY REPORT*  Clinical Data: Status post right hip replacement.  PORTABLE PELVIS  Comparison: None.  Findings: Right total hip prosthesis in satisfactory position and alignment.  No fracture or dislocation seen.  IMPRESSION: Satisfactory postoperative appearance of a right total hip prosthesis.   Original Report Authenticated By: Darrol Angel, M.D.    Dg Hip Portable 1 View Right  12/21/2011  *RADIOLOGY REPORT*  Clinical Data: Postop right hip arthroplasty.  PORTABLE RIGHT HIP - 1 VIEW 12/21/2011 1258 hours:  Comparison: Intraoperative right hip images earlier same date 1100 hours.  Findings: Cross-table lateral image demonstrates anatomic alignment of the right hip prosthesis without acute complicating features.  IMPRESSION: Anatomic alignment post right hip arthroplasty without acute complicating features.   Original Report Authenticated By: Arnell Sieving, M.D.    Dg C-arm 1-60 Min-no Report  12/21/2011  CLINICAL DATA: right anterior hip   C-ARM 1-60 MINUTES  Fluoroscopy was utilized by the requesting physician.  No radiographic  interpretation.      Disposition: to home      Discharge Orders    Future Orders Please Complete By Expires   Diet - low sodium heart healthy       Foley catheter - discontinue      Call MD / Call 911      Comments:   If you experience chest pain or shortness of breath, CALL 911 and be transported to the hospital emergency room.  If you develope a fever above 101 F, pus (white drainage) or increased drainage or redness at the wound, or calf pain, call your surgeon's office.   Constipation Prevention      Comments:   Drink plenty of fluids.  Prune juice may be helpful.  You may use a stool softener, such as Colace (over the counter) 100 mg twice a day.  Use MiraLax (over the counter) for constipation as needed.   Increase activity slowly as tolerated      Discharge instructions      Comments:   Expect right thigh/leg/foot swelling and bruising. Increase your activities as comfort allows. No hip precaution. You can get your incision wet in the shower daily starting 12/26/11; dry dressing daily.   Discharge patient         Follow-up Information    Follow up with Kathryne Hitch, MD. In 2  weeks.   Contact information:   [facility] [address] [place] [address] [phone]           Signed: Kathryne Hitch 12/25/2011, 7:00 AM

## 2011-12-25 NOTE — Progress Notes (Signed)
  CARE MANAGEMENT NOTE 12/25/2011  Patient:  Margaret Cowan, Margaret Cowan   Account Number:  [id number]  Date Initiated:  12/23/2011  Documentation initiated by:  DAVIS,TYMEEKA  Subjective/Objective Assessment:   61 YO FEMALE ADMITTED S/P right total hip replacement.     Action/Plan:   HOME WHEN STABLE   Anticipated DC Date:  12/24/2011   Anticipated DC Plan:  HOME W HOME HEALTH SERVICES  In-house referral  NA      DC Planning Services  CM consult      PAC Choice  DURABLE MEDICAL EQUIPMENT  HOME HEALTH   Choice offered to / List presented to:  C-1 Patient   DME arranged  3-N-1  Levan Hurst      DME agency  [facility]     HH arranged  HH-2 PT      [facility] agency  [facility]   Status of service:  Completed, signed off Medicare Important Message given?  NO (If response is "NO", the following Medicare IM given date fields will be blank) Date Medicare IM given:   Date Additional Medicare IM given:    Discharge Disposition:  HOME W HOME HEALTH SERVICES  Per UR Regulation:  Reviewed for med. necessity/level of care/duration of stay      Comments:  09./23/2013 Raynelle Bring BSN CCM [phone] PT FOR DISCHARGE TODAY. [facility] will provide h[facility] services with start date of 12/26/2011.

## 2011-12-25 NOTE — Progress Notes (Signed)
Physical Therapy Treatment Patient Details Name: Margaret Cowan MRN: [id number] DOB: 1950-05-05 Today's Date: 12/25/2011 Time: 3086-5784 PT Time Calculation (min): 19 min  PT Assessment / Plan / Recommendation Comments on Treatment Session  Pt ambulated in hallway and performed exercises.  Pt feels ready for d/c home likely later today.  Pt had no further questions/concerns regarding d/c home.    Follow Up Recommendations  Home health PT;Supervision for mobility/OOB    Barriers to Discharge        Equipment Recommendations  Rolling walker with 5" wheels;3 in 1 bedside comode    Recommendations for Other Services    Frequency     Plan Discharge plan remains appropriate;Frequency remains appropriate    Precautions / Restrictions Precautions Precautions: None Restrictions Weight Bearing Restrictions: No RLE Weight Bearing: Weight bearing as tolerated   Pertinent Vitals/Pain 4/10 R knee, repositioned, premedicated    Mobility  Bed Mobility Details for Bed Mobility Assistance: pt sitting EOB on arrival, reports she used sheet to assist R LE Transfers Transfers: Sit to Stand;Stand to Sit Sit to Stand: 5: Supervision;With upper extremity assist;From bed Stand to Sit: 5: Supervision;With upper extremity assist;To chair/3-in-1 Details for Transfer Assistance: verbal cue to take RW back to chair Ambulation/Gait Ambulation/Gait Assistance: 4: Min guard Ambulation Distance (Feet): 60 Feet Assistive device: Rolling walker Ambulation/Gait Assistance Details: pt continues to use UEs to take weight for R LE due to knee pain with ambulation Gait Pattern: Step-to pattern;Trunk flexed;Antalgic    Exercises Total Joint Exercises Ankle Circles/Pumps: AROM;Both;20 reps Quad Sets: AROM;Strengthening;Both;20 reps Hip ABduction/ADduction: AAROM;Strengthening;Right;20 reps;Supine Long Arc Quad: Seated;20 reps;AAROM;Strengthening;Right   PT Diagnosis:    PT Problem List:   PT Treatment  Interventions:     PT Goals Acute Rehab PT Goals PT Goal: Sit to Stand - Progress: Progressing toward goal PT Goal: Stand to Sit - Progress: Progressing toward goal PT Goal: Ambulate - Progress: Goal set today  Visit Information  Last PT Received On: 12/25/11 Assistance Needed: +1    Subjective Data  Subjective: oh good, can you help me over to the chair?   Cognition  Overall Cognitive Status: Appears within functional limits for tasks assessed/performed    Balance     End of Session PT - End of Session Activity Tolerance: Patient tolerated treatment well Patient left: in chair;with call bell/phone within reach   GP     [facility] E 12/25/2011, 11:41 AM Pager: [phone]

## 2011-12-25 NOTE — Progress Notes (Signed)
Subjective: 4 Days Post-Op Procedure(s) (LRB): TOTAL HIP ARTHROPLASTY ANTERIOR APPROACH (Right) Patient reports pain as moderate.  Some issues with urinary retention, but likely pain med related.  Objective: Vital signs in last 24 hours: Temp:  [97.8 F (36.6 C)-98.9 F (37.2 C)] 98.2 F (36.8 C) (09/24 0459) Pulse Rate:  [71-84] 73  (09/24 0459) Resp:  [16-18] 16  (09/24 0459) BP: (91-118)/(48-70) 94/54 mmHg (09/24 0459) SpO2:  [92 %-98 %] 96 % (09/24 0459)  Intake/Output from previous day: 09/23 0701 - 09/24 0700 In: 1367.5 [P.O.:840; I.V.:250; Blood:277.5] Out: 2101 [Urine:2100; Stool:1] Intake/Output this shift: Total I/O In: 240 [P.O.:240] Out: 2000 [Urine:2000]   Basename 12/24/11 0357 12/23/11 0450  HGB 7.5* 7.7*    Basename 12/24/11 0357 12/23/11 0450  WBC 12.9* 8.3  RBC 2.69* 2.71*  HCT 23.3* 23.3*  PLT 198 176   No results found for this basename: NA:2,K:2,CL:2,CO2:2,BUN:2,CREATININE:2,GLUCOSE:2,CALCIUM:2 in the last 72 hours No results found for this basename: LABPT:2,INR:2 in the last 72 hours  Sensation intact distally Intact pulses distally Dorsiflexion/Plantar flexion intact Incision: scant drainage  Assessment/Plan: 4 Days Post-Op Procedure(s) (LRB): TOTAL HIP ARTHROPLASTY ANTERIOR APPROACH (Right) Discharge home with home health D/c foley  Shenandoah Yeats Y 12/25/2011, 6:53 AM

## 2011-12-25 NOTE — Progress Notes (Signed)
Pt for d/c home today with [facility] PT. DMEs already delivered & brought home by son. Dressing CDI to R hip (Gauze & Tegaderm). Noted Loose bm had subsided at this time. D/C instructions & RX given on d/c with verbalized understanding. Son to pick up pt & assist with d/c. IV d/c'd. Still with R knee persistent pain & apparently will be worked on by MD after R hip heals.

## 2011-12-25 NOTE — Progress Notes (Signed)
Occupational Therapy Note Spoke to pt and she states no OT needs before discharge today. She does agree to [facility] to further assess tub setup and transfer at home and whether her tubseat will be adequate versus obtaining a tub bench.  Judithann Sauger OTR/L [phone] 12/25/2011

## 2011-12-26 DIAGNOSIS — IMO0001 Reserved for inherently not codable concepts without codable children: Secondary | ICD-10-CM | POA: Diagnosis not present

## 2011-12-26 DIAGNOSIS — D510 Vitamin B12 deficiency anemia due to intrinsic factor deficiency: Secondary | ICD-10-CM | POA: Diagnosis not present

## 2011-12-26 DIAGNOSIS — M549 Dorsalgia, unspecified: Secondary | ICD-10-CM | POA: Diagnosis not present

## 2011-12-26 DIAGNOSIS — G609 Hereditary and idiopathic neuropathy, unspecified: Secondary | ICD-10-CM | POA: Diagnosis not present

## 2011-12-26 DIAGNOSIS — G8929 Other chronic pain: Secondary | ICD-10-CM | POA: Diagnosis not present

## 2011-12-26 DIAGNOSIS — I10 Essential (primary) hypertension: Secondary | ICD-10-CM | POA: Diagnosis not present

## 2011-12-27 DIAGNOSIS — G8929 Other chronic pain: Secondary | ICD-10-CM | POA: Diagnosis not present

## 2011-12-27 DIAGNOSIS — IMO0001 Reserved for inherently not codable concepts without codable children: Secondary | ICD-10-CM | POA: Diagnosis not present

## 2011-12-27 DIAGNOSIS — M549 Dorsalgia, unspecified: Secondary | ICD-10-CM | POA: Diagnosis not present

## 2011-12-27 DIAGNOSIS — D510 Vitamin B12 deficiency anemia due to intrinsic factor deficiency: Secondary | ICD-10-CM | POA: Diagnosis not present

## 2011-12-27 DIAGNOSIS — I10 Essential (primary) hypertension: Secondary | ICD-10-CM | POA: Diagnosis not present

## 2011-12-27 DIAGNOSIS — G609 Hereditary and idiopathic neuropathy, unspecified: Secondary | ICD-10-CM | POA: Diagnosis not present

## 2011-12-28 DIAGNOSIS — I10 Essential (primary) hypertension: Secondary | ICD-10-CM | POA: Diagnosis not present

## 2011-12-28 DIAGNOSIS — M549 Dorsalgia, unspecified: Secondary | ICD-10-CM | POA: Diagnosis not present

## 2011-12-28 DIAGNOSIS — G8929 Other chronic pain: Secondary | ICD-10-CM | POA: Diagnosis not present

## 2011-12-28 DIAGNOSIS — G609 Hereditary and idiopathic neuropathy, unspecified: Secondary | ICD-10-CM | POA: Diagnosis not present

## 2011-12-28 DIAGNOSIS — D510 Vitamin B12 deficiency anemia due to intrinsic factor deficiency: Secondary | ICD-10-CM | POA: Diagnosis not present

## 2011-12-28 DIAGNOSIS — IMO0001 Reserved for inherently not codable concepts without codable children: Secondary | ICD-10-CM | POA: Diagnosis not present

## 2011-12-31 DIAGNOSIS — IMO0001 Reserved for inherently not codable concepts without codable children: Secondary | ICD-10-CM | POA: Diagnosis not present

## 2011-12-31 DIAGNOSIS — D510 Vitamin B12 deficiency anemia due to intrinsic factor deficiency: Secondary | ICD-10-CM | POA: Diagnosis not present

## 2011-12-31 DIAGNOSIS — I10 Essential (primary) hypertension: Secondary | ICD-10-CM | POA: Diagnosis not present

## 2011-12-31 DIAGNOSIS — G8929 Other chronic pain: Secondary | ICD-10-CM | POA: Diagnosis not present

## 2011-12-31 DIAGNOSIS — G609 Hereditary and idiopathic neuropathy, unspecified: Secondary | ICD-10-CM | POA: Diagnosis not present

## 2011-12-31 DIAGNOSIS — M549 Dorsalgia, unspecified: Secondary | ICD-10-CM | POA: Diagnosis not present

## 2012-01-02 DIAGNOSIS — I10 Essential (primary) hypertension: Secondary | ICD-10-CM | POA: Diagnosis not present

## 2012-01-02 DIAGNOSIS — G8929 Other chronic pain: Secondary | ICD-10-CM | POA: Diagnosis not present

## 2012-01-02 DIAGNOSIS — IMO0001 Reserved for inherently not codable concepts without codable children: Secondary | ICD-10-CM | POA: Diagnosis not present

## 2012-01-02 DIAGNOSIS — G609 Hereditary and idiopathic neuropathy, unspecified: Secondary | ICD-10-CM | POA: Diagnosis not present

## 2012-01-02 DIAGNOSIS — D510 Vitamin B12 deficiency anemia due to intrinsic factor deficiency: Secondary | ICD-10-CM | POA: Diagnosis not present

## 2012-01-02 DIAGNOSIS — M549 Dorsalgia, unspecified: Secondary | ICD-10-CM | POA: Diagnosis not present

## 2012-01-03 DIAGNOSIS — M23329 Other meniscus derangements, posterior horn of medial meniscus, unspecified knee: Secondary | ICD-10-CM | POA: Diagnosis not present

## 2012-01-03 DIAGNOSIS — M25559 Pain in unspecified hip: Secondary | ICD-10-CM | POA: Diagnosis not present

## 2012-01-03 DIAGNOSIS — M25569 Pain in unspecified knee: Secondary | ICD-10-CM | POA: Diagnosis not present

## 2012-01-03 DIAGNOSIS — M1610 Unilateral primary osteoarthritis, unspecified hip: Secondary | ICD-10-CM | POA: Diagnosis not present

## 2012-01-04 DIAGNOSIS — D510 Vitamin B12 deficiency anemia due to intrinsic factor deficiency: Secondary | ICD-10-CM | POA: Diagnosis not present

## 2012-01-04 DIAGNOSIS — IMO0001 Reserved for inherently not codable concepts without codable children: Secondary | ICD-10-CM | POA: Diagnosis not present

## 2012-01-04 DIAGNOSIS — G609 Hereditary and idiopathic neuropathy, unspecified: Secondary | ICD-10-CM | POA: Diagnosis not present

## 2012-01-04 DIAGNOSIS — I10 Essential (primary) hypertension: Secondary | ICD-10-CM | POA: Diagnosis not present

## 2012-01-04 DIAGNOSIS — M549 Dorsalgia, unspecified: Secondary | ICD-10-CM | POA: Diagnosis not present

## 2012-01-04 DIAGNOSIS — G8929 Other chronic pain: Secondary | ICD-10-CM | POA: Diagnosis not present

## 2012-01-08 DIAGNOSIS — M25569 Pain in unspecified knee: Secondary | ICD-10-CM | POA: Diagnosis not present

## 2012-01-08 DIAGNOSIS — Z79899 Other long term (current) drug therapy: Secondary | ICD-10-CM | POA: Diagnosis not present

## 2012-01-08 DIAGNOSIS — D510 Vitamin B12 deficiency anemia due to intrinsic factor deficiency: Secondary | ICD-10-CM | POA: Diagnosis not present

## 2012-01-08 DIAGNOSIS — M1610 Unilateral primary osteoarthritis, unspecified hip: Secondary | ICD-10-CM | POA: Diagnosis not present

## 2012-01-08 DIAGNOSIS — M159 Polyosteoarthritis, unspecified: Secondary | ICD-10-CM | POA: Diagnosis not present

## 2012-01-08 DIAGNOSIS — Z5181 Encounter for therapeutic drug level monitoring: Secondary | ICD-10-CM | POA: Diagnosis not present

## 2012-01-08 DIAGNOSIS — I10 Essential (primary) hypertension: Secondary | ICD-10-CM | POA: Diagnosis not present

## 2012-01-08 DIAGNOSIS — IMO0001 Reserved for inherently not codable concepts without codable children: Secondary | ICD-10-CM | POA: Diagnosis not present

## 2012-01-09 DIAGNOSIS — IMO0001 Reserved for inherently not codable concepts without codable children: Secondary | ICD-10-CM | POA: Diagnosis not present

## 2012-01-09 DIAGNOSIS — D510 Vitamin B12 deficiency anemia due to intrinsic factor deficiency: Secondary | ICD-10-CM | POA: Diagnosis not present

## 2012-01-09 DIAGNOSIS — I10 Essential (primary) hypertension: Secondary | ICD-10-CM | POA: Diagnosis not present

## 2012-01-09 DIAGNOSIS — G609 Hereditary and idiopathic neuropathy, unspecified: Secondary | ICD-10-CM | POA: Diagnosis not present

## 2012-01-09 DIAGNOSIS — G8929 Other chronic pain: Secondary | ICD-10-CM | POA: Diagnosis not present

## 2012-01-09 DIAGNOSIS — M549 Dorsalgia, unspecified: Secondary | ICD-10-CM | POA: Diagnosis not present

## 2012-01-10 DIAGNOSIS — M549 Dorsalgia, unspecified: Secondary | ICD-10-CM | POA: Diagnosis not present

## 2012-01-10 DIAGNOSIS — IMO0001 Reserved for inherently not codable concepts without codable children: Secondary | ICD-10-CM | POA: Diagnosis not present

## 2012-01-10 DIAGNOSIS — D510 Vitamin B12 deficiency anemia due to intrinsic factor deficiency: Secondary | ICD-10-CM | POA: Diagnosis not present

## 2012-01-10 DIAGNOSIS — G609 Hereditary and idiopathic neuropathy, unspecified: Secondary | ICD-10-CM | POA: Diagnosis not present

## 2012-01-10 DIAGNOSIS — G8929 Other chronic pain: Secondary | ICD-10-CM | POA: Diagnosis not present

## 2012-01-10 DIAGNOSIS — I10 Essential (primary) hypertension: Secondary | ICD-10-CM | POA: Diagnosis not present

## 2012-01-14 DIAGNOSIS — I10 Essential (primary) hypertension: Secondary | ICD-10-CM | POA: Diagnosis not present

## 2012-01-14 DIAGNOSIS — D510 Vitamin B12 deficiency anemia due to intrinsic factor deficiency: Secondary | ICD-10-CM | POA: Diagnosis not present

## 2012-01-14 DIAGNOSIS — G8929 Other chronic pain: Secondary | ICD-10-CM | POA: Diagnosis not present

## 2012-01-14 DIAGNOSIS — M549 Dorsalgia, unspecified: Secondary | ICD-10-CM | POA: Diagnosis not present

## 2012-01-14 DIAGNOSIS — G609 Hereditary and idiopathic neuropathy, unspecified: Secondary | ICD-10-CM | POA: Diagnosis not present

## 2012-01-14 DIAGNOSIS — IMO0001 Reserved for inherently not codable concepts without codable children: Secondary | ICD-10-CM | POA: Diagnosis not present

## 2012-01-16 DIAGNOSIS — G8929 Other chronic pain: Secondary | ICD-10-CM | POA: Diagnosis not present

## 2012-01-16 DIAGNOSIS — IMO0001 Reserved for inherently not codable concepts without codable children: Secondary | ICD-10-CM | POA: Diagnosis not present

## 2012-01-16 DIAGNOSIS — G609 Hereditary and idiopathic neuropathy, unspecified: Secondary | ICD-10-CM | POA: Diagnosis not present

## 2012-01-16 DIAGNOSIS — D510 Vitamin B12 deficiency anemia due to intrinsic factor deficiency: Secondary | ICD-10-CM | POA: Diagnosis not present

## 2012-01-16 DIAGNOSIS — M549 Dorsalgia, unspecified: Secondary | ICD-10-CM | POA: Diagnosis not present

## 2012-01-16 DIAGNOSIS — I10 Essential (primary) hypertension: Secondary | ICD-10-CM | POA: Diagnosis not present

## 2012-01-17 DIAGNOSIS — M159 Polyosteoarthritis, unspecified: Secondary | ICD-10-CM | POA: Diagnosis not present

## 2012-01-17 DIAGNOSIS — Z79899 Other long term (current) drug therapy: Secondary | ICD-10-CM | POA: Diagnosis not present

## 2012-01-17 DIAGNOSIS — F411 Generalized anxiety disorder: Secondary | ICD-10-CM | POA: Diagnosis not present

## 2012-01-17 DIAGNOSIS — G8929 Other chronic pain: Secondary | ICD-10-CM | POA: Diagnosis not present

## 2012-01-17 DIAGNOSIS — Z6833 Body mass index (BMI) 33.0-33.9, adult: Secondary | ICD-10-CM | POA: Diagnosis not present

## 2012-01-18 DIAGNOSIS — D510 Vitamin B12 deficiency anemia due to intrinsic factor deficiency: Secondary | ICD-10-CM | POA: Diagnosis not present

## 2012-01-18 DIAGNOSIS — I10 Essential (primary) hypertension: Secondary | ICD-10-CM | POA: Diagnosis not present

## 2012-01-18 DIAGNOSIS — G609 Hereditary and idiopathic neuropathy, unspecified: Secondary | ICD-10-CM | POA: Diagnosis not present

## 2012-01-18 DIAGNOSIS — M549 Dorsalgia, unspecified: Secondary | ICD-10-CM | POA: Diagnosis not present

## 2012-01-18 DIAGNOSIS — G8929 Other chronic pain: Secondary | ICD-10-CM | POA: Diagnosis not present

## 2012-01-18 DIAGNOSIS — IMO0001 Reserved for inherently not codable concepts without codable children: Secondary | ICD-10-CM | POA: Diagnosis not present

## 2012-01-22 DIAGNOSIS — I10 Essential (primary) hypertension: Secondary | ICD-10-CM | POA: Diagnosis not present

## 2012-01-22 DIAGNOSIS — G8929 Other chronic pain: Secondary | ICD-10-CM | POA: Diagnosis not present

## 2012-01-22 DIAGNOSIS — G609 Hereditary and idiopathic neuropathy, unspecified: Secondary | ICD-10-CM | POA: Diagnosis not present

## 2012-01-22 DIAGNOSIS — M549 Dorsalgia, unspecified: Secondary | ICD-10-CM | POA: Diagnosis not present

## 2012-01-22 DIAGNOSIS — D510 Vitamin B12 deficiency anemia due to intrinsic factor deficiency: Secondary | ICD-10-CM | POA: Diagnosis not present

## 2012-01-22 DIAGNOSIS — IMO0001 Reserved for inherently not codable concepts without codable children: Secondary | ICD-10-CM | POA: Diagnosis not present

## 2012-01-27 DIAGNOSIS — F259 Schizoaffective disorder, unspecified: Secondary | ICD-10-CM | POA: Diagnosis not present

## 2012-01-27 DIAGNOSIS — D510 Vitamin B12 deficiency anemia due to intrinsic factor deficiency: Secondary | ICD-10-CM | POA: Diagnosis not present

## 2012-01-27 DIAGNOSIS — IMO0001 Reserved for inherently not codable concepts without codable children: Secondary | ICD-10-CM | POA: Diagnosis not present

## 2012-01-27 DIAGNOSIS — I509 Heart failure, unspecified: Secondary | ICD-10-CM | POA: Diagnosis not present

## 2012-01-27 DIAGNOSIS — I10 Essential (primary) hypertension: Secondary | ICD-10-CM | POA: Diagnosis not present

## 2012-01-27 DIAGNOSIS — G609 Hereditary and idiopathic neuropathy, unspecified: Secondary | ICD-10-CM | POA: Diagnosis not present

## 2012-02-14 DIAGNOSIS — I509 Heart failure, unspecified: Secondary | ICD-10-CM | POA: Diagnosis not present

## 2012-02-14 DIAGNOSIS — IMO0001 Reserved for inherently not codable concepts without codable children: Secondary | ICD-10-CM | POA: Diagnosis not present

## 2012-02-14 DIAGNOSIS — I10 Essential (primary) hypertension: Secondary | ICD-10-CM | POA: Diagnosis not present

## 2012-02-14 DIAGNOSIS — D510 Vitamin B12 deficiency anemia due to intrinsic factor deficiency: Secondary | ICD-10-CM | POA: Diagnosis not present

## 2012-02-14 DIAGNOSIS — F259 Schizoaffective disorder, unspecified: Secondary | ICD-10-CM | POA: Diagnosis not present

## 2012-02-14 DIAGNOSIS — G609 Hereditary and idiopathic neuropathy, unspecified: Secondary | ICD-10-CM | POA: Diagnosis not present

## 2012-03-03 DIAGNOSIS — M1610 Unilateral primary osteoarthritis, unspecified hip: Secondary | ICD-10-CM | POA: Diagnosis not present

## 2012-03-24 DIAGNOSIS — IMO0001 Reserved for inherently not codable concepts without codable children: Secondary | ICD-10-CM | POA: Diagnosis not present

## 2012-03-24 DIAGNOSIS — I10 Essential (primary) hypertension: Secondary | ICD-10-CM | POA: Diagnosis not present

## 2012-03-24 DIAGNOSIS — F259 Schizoaffective disorder, unspecified: Secondary | ICD-10-CM | POA: Diagnosis not present

## 2012-03-24 DIAGNOSIS — D510 Vitamin B12 deficiency anemia due to intrinsic factor deficiency: Secondary | ICD-10-CM | POA: Diagnosis not present

## 2012-03-24 DIAGNOSIS — G609 Hereditary and idiopathic neuropathy, unspecified: Secondary | ICD-10-CM | POA: Diagnosis not present

## 2012-03-24 DIAGNOSIS — I509 Heart failure, unspecified: Secondary | ICD-10-CM | POA: Diagnosis not present

## 2012-03-27 DIAGNOSIS — IMO0001 Reserved for inherently not codable concepts without codable children: Secondary | ICD-10-CM | POA: Diagnosis not present

## 2012-03-27 DIAGNOSIS — D510 Vitamin B12 deficiency anemia due to intrinsic factor deficiency: Secondary | ICD-10-CM | POA: Diagnosis not present

## 2012-03-27 DIAGNOSIS — H2510 Age-related nuclear cataract, unspecified eye: Secondary | ICD-10-CM | POA: Diagnosis not present

## 2012-03-27 DIAGNOSIS — H52229 Regular astigmatism, unspecified eye: Secondary | ICD-10-CM | POA: Diagnosis not present

## 2012-03-27 DIAGNOSIS — I509 Heart failure, unspecified: Secondary | ICD-10-CM | POA: Diagnosis not present

## 2012-03-27 DIAGNOSIS — F259 Schizoaffective disorder, unspecified: Secondary | ICD-10-CM | POA: Diagnosis not present

## 2012-03-27 DIAGNOSIS — H524 Presbyopia: Secondary | ICD-10-CM | POA: Diagnosis not present

## 2012-03-27 DIAGNOSIS — H5200 Hypermetropia, unspecified eye: Secondary | ICD-10-CM | POA: Diagnosis not present

## 2012-03-27 DIAGNOSIS — I10 Essential (primary) hypertension: Secondary | ICD-10-CM | POA: Diagnosis not present

## 2012-03-27 DIAGNOSIS — G609 Hereditary and idiopathic neuropathy, unspecified: Secondary | ICD-10-CM | POA: Diagnosis not present

## 2012-04-03 DIAGNOSIS — M2240 Chondromalacia patellae, unspecified knee: Secondary | ICD-10-CM | POA: Diagnosis not present

## 2012-04-03 DIAGNOSIS — M23329 Other meniscus derangements, posterior horn of medial meniscus, unspecified knee: Secondary | ICD-10-CM | POA: Diagnosis not present

## 2012-04-03 DIAGNOSIS — M23305 Other meniscus derangements, unspecified medial meniscus, unspecified knee: Secondary | ICD-10-CM | POA: Diagnosis not present

## 2012-04-03 DIAGNOSIS — M1610 Unilateral primary osteoarthritis, unspecified hip: Secondary | ICD-10-CM | POA: Diagnosis not present

## 2012-04-03 DIAGNOSIS — M9420 Chondromalacia, unspecified site: Secondary | ICD-10-CM | POA: Diagnosis not present

## 2012-04-10 DIAGNOSIS — M169 Osteoarthritis of hip, unspecified: Secondary | ICD-10-CM | POA: Diagnosis not present

## 2012-04-10 DIAGNOSIS — M25569 Pain in unspecified knee: Secondary | ICD-10-CM | POA: Diagnosis not present

## 2012-04-10 DIAGNOSIS — G894 Chronic pain syndrome: Secondary | ICD-10-CM | POA: Diagnosis not present

## 2012-04-10 DIAGNOSIS — M545 Low back pain: Secondary | ICD-10-CM | POA: Diagnosis not present

## 2012-04-29 ENCOUNTER — Other Ambulatory Visit (HOSPITAL_COMMUNITY): Payer: Self-pay | Admitting: Orthopaedic Surgery

## 2012-04-29 DIAGNOSIS — M23329 Other meniscus derangements, posterior horn of medial meniscus, unspecified knee: Secondary | ICD-10-CM | POA: Diagnosis not present

## 2012-04-29 DIAGNOSIS — M1610 Unilateral primary osteoarthritis, unspecified hip: Secondary | ICD-10-CM | POA: Diagnosis not present

## 2012-05-12 ENCOUNTER — Encounter (HOSPITAL_COMMUNITY): Payer: Self-pay | Admitting: Pharmacy Technician

## 2012-05-14 ENCOUNTER — Inpatient Hospital Stay (HOSPITAL_COMMUNITY): Admission: RE | Admit: 2012-05-14 | Payer: Medicare Other | Source: Ambulatory Visit

## 2012-05-19 ENCOUNTER — Encounter (HOSPITAL_COMMUNITY)
Admission: RE | Admit: 2012-05-19 | Discharge: 2012-05-19 | Disposition: A | Discharge status: Home / Self Care | Payer: Medicare Other | Source: Ambulatory Visit | Attending: Orthopaedic Surgery | Admitting: Orthopaedic Surgery

## 2012-05-19 ENCOUNTER — Encounter (HOSPITAL_COMMUNITY): Payer: Self-pay

## 2012-05-19 DIAGNOSIS — M159 Polyosteoarthritis, unspecified: Secondary | ICD-10-CM | POA: Diagnosis not present

## 2012-05-19 DIAGNOSIS — Z6836 Body mass index (BMI) 36.0-36.9, adult: Secondary | ICD-10-CM | POA: Diagnosis not present

## 2012-05-19 DIAGNOSIS — Z96649 Presence of unspecified artificial hip joint: Secondary | ICD-10-CM | POA: Diagnosis not present

## 2012-05-19 DIAGNOSIS — L02818 Cutaneous abscess of other sites: Secondary | ICD-10-CM | POA: Diagnosis not present

## 2012-05-19 DIAGNOSIS — D62 Acute posthemorrhagic anemia: Secondary | ICD-10-CM | POA: Diagnosis not present

## 2012-05-19 DIAGNOSIS — D510 Vitamin B12 deficiency anemia due to intrinsic factor deficiency: Secondary | ICD-10-CM | POA: Diagnosis not present

## 2012-05-19 DIAGNOSIS — Z96659 Presence of unspecified artificial knee joint: Secondary | ICD-10-CM | POA: Diagnosis not present

## 2012-05-19 DIAGNOSIS — M1710 Unilateral primary osteoarthritis, unspecified knee: Secondary | ICD-10-CM | POA: Diagnosis not present

## 2012-05-19 DIAGNOSIS — M1610 Unilateral primary osteoarthritis, unspecified hip: Secondary | ICD-10-CM | POA: Diagnosis not present

## 2012-05-19 DIAGNOSIS — Z471 Aftercare following joint replacement surgery: Secondary | ICD-10-CM | POA: Diagnosis not present

## 2012-05-19 DIAGNOSIS — I10 Essential (primary) hypertension: Secondary | ICD-10-CM | POA: Diagnosis not present

## 2012-05-19 DIAGNOSIS — K219 Gastro-esophageal reflux disease without esophagitis: Secondary | ICD-10-CM | POA: Diagnosis not present

## 2012-05-19 DIAGNOSIS — F259 Schizoaffective disorder, unspecified: Secondary | ICD-10-CM | POA: Diagnosis not present

## 2012-05-19 DIAGNOSIS — F411 Generalized anxiety disorder: Secondary | ICD-10-CM | POA: Diagnosis not present

## 2012-05-19 DIAGNOSIS — IMO0001 Reserved for inherently not codable concepts without codable children: Secondary | ICD-10-CM | POA: Diagnosis not present

## 2012-05-19 DIAGNOSIS — M23329 Other meniscus derangements, posterior horn of medial meniscus, unspecified knee: Secondary | ICD-10-CM | POA: Diagnosis not present

## 2012-05-19 DIAGNOSIS — E039 Hypothyroidism, unspecified: Secondary | ICD-10-CM | POA: Diagnosis not present

## 2012-05-19 DIAGNOSIS — F209 Schizophrenia, unspecified: Secondary | ICD-10-CM | POA: Diagnosis present

## 2012-05-19 DIAGNOSIS — I509 Heart failure, unspecified: Secondary | ICD-10-CM | POA: Diagnosis not present

## 2012-05-19 DIAGNOSIS — G609 Hereditary and idiopathic neuropathy, unspecified: Secondary | ICD-10-CM | POA: Diagnosis not present

## 2012-05-19 DIAGNOSIS — Z79899 Other long term (current) drug therapy: Secondary | ICD-10-CM | POA: Diagnosis not present

## 2012-05-19 HISTORY — DX: Personal history of other medical treatment: Z92.89

## 2012-05-19 LAB — BASIC METABOLIC PANEL
BUN: 18 mg/dL (ref 6–23)
Chloride: 101 mEq/L (ref 96–112)
Creatinine, Ser: 0.59 mg/dL (ref 0.50–1.10)
GFR calc Af Amer: >90 mL/min (ref >90)
Glucose, Bld: 90 mg/dL (ref 70–99)

## 2012-05-19 LAB — URINALYSIS, ROUTINE W REFLEX MICROSCOPIC
Bilirubin Urine: NEGATIVE
Hgb urine dipstick: NEGATIVE
Ketones, ur: NEGATIVE mg/dL
Protein, ur: NEGATIVE mg/dL
Urobilinogen, UA: 0.2 mg/dL (ref 0.0–1.0)

## 2012-05-19 LAB — PROTIME-INR
INR: 0.95 (ref 0.00–1.49)
Prothrombin Time: 12.6 seconds (ref 11.6–15.2)

## 2012-05-19 LAB — CBC
HCT: 37 % (ref 36.0–46.0)
MCH: 28.2 pg (ref 26.0–34.0)
MCHC: 31.6 g/dL (ref 30.0–36.0)
RDW: 13.4 % (ref 11.5–15.5)

## 2012-05-19 LAB — APTT: aPTT: 31 seconds (ref 24–37)

## 2012-05-19 LAB — SURGICAL PCR SCREEN
MRSA, PCR: POSITIVE — AB
Staphylococcus aureus: POSITIVE — AB

## 2012-05-19 LAB — URINE MICROSCOPIC-ADD ON

## 2012-05-19 NOTE — Patient Instructions (Addendum)
20 Margaret Cowan  05/19/2012   Your procedure is scheduled on:  05/23/12  FRIDAY  Report to [facility] at  0725      AM.  Call this number if you have problems the morning of surgery: [phone]     BRING CPAP MASK AND TUBING WITH YOU TO HOSPITAL  Remember:   Do not eat food  Or drink :After Midnight. Thursday NIGHT   Take these medicines the morning of surgery with A SIP OF WATER: Norvasc, Nexium, Lexapro, Levothyroxine, Xanax           Dilaudid or phenergran if needed   .  Contacts, dentures or partial plates can not be worn to surgery  Leave suitcase in the car. After surgery it may be brought to your room.  For patients admitted to the hospital, checkout time is 11:00 AM day of  discharge.             SPECIAL INSTRUCTIONS- SEE [place] PREPARING FOR SURGERY INSTRUCTION SHEET-     DO NOT WEAR JEWELRY, LOTIONS, POWDERS, OR PERFUMES.  WOMEN-- DO NOT SHAVE LEGS OR UNDERARMS FOR 12 HOURS BEFORE SHOWERS. MEN MAY SHAVE FACE.  Patients discharged the day of surgery will not be allowed to drive home. IF going home the day of surgery, you must have a driver and someone to stay with you for the first 24 hours  Name and phone number of your driver:    admission                                                                    Please read over the following fact sheets that you were given: MRSA Information, Incentive Spirometry Sheet, Blood Transfusion Sheet  Information                                                                                   [name]  PST 336  [phone]                 FAILURE TO FOLLOW THESE INSTRUCTIONS MAY RESULT IN  CANCELLATION   OF YOUR SURGERY                                                  Patient Signature _____________________________

## 2012-05-19 NOTE — Progress Notes (Signed)
LOV Dr Kirke Corin cardiology 2012 EPIC with eccho 11/12

## 2012-05-19 NOTE — Progress Notes (Signed)
Chest, EKG 9/13 EPIC

## 2012-05-20 ENCOUNTER — Other Ambulatory Visit (HOSPITAL_COMMUNITY): Payer: Self-pay | Admitting: Orthopaedic Surgery

## 2012-05-20 DIAGNOSIS — M159 Polyosteoarthritis, unspecified: Secondary | ICD-10-CM | POA: Diagnosis not present

## 2012-05-20 DIAGNOSIS — L02818 Cutaneous abscess of other sites: Secondary | ICD-10-CM | POA: Diagnosis not present

## 2012-05-20 DIAGNOSIS — L03818 Cellulitis of other sites: Secondary | ICD-10-CM | POA: Diagnosis not present

## 2012-05-20 DIAGNOSIS — Z6836 Body mass index (BMI) 36.0-36.9, adult: Secondary | ICD-10-CM | POA: Diagnosis not present

## 2012-05-20 DIAGNOSIS — E039 Hypothyroidism, unspecified: Secondary | ICD-10-CM | POA: Diagnosis not present

## 2012-05-20 NOTE — Progress Notes (Signed)
Left message with Cordelia Pen at Dr Vevelyn Royals schedular that PCR was faxed and patient stated today has sore in nares- instructed patient to massage nose well after Mupirocin placed

## 2012-05-20 NOTE — Progress Notes (Signed)
Per Cordelia Pen at Dr Vevelyn Royals office- urine fax received 05/19/12.  Faxed POS  PCR to Dr Magnus Ivan for review 05/20/12

## 2012-05-21 ENCOUNTER — Other Ambulatory Visit (HOSPITAL_COMMUNITY): Payer: Self-pay | Admitting: Internal Medicine

## 2012-05-23 ENCOUNTER — Inpatient Hospital Stay (HOSPITAL_COMMUNITY): Payer: Medicare Other | Admitting: Anesthesiology

## 2012-05-23 ENCOUNTER — Inpatient Hospital Stay (HOSPITAL_COMMUNITY): Payer: Medicare Other

## 2012-05-23 ENCOUNTER — Encounter (HOSPITAL_COMMUNITY): Payer: Self-pay | Admitting: *Deleted

## 2012-05-23 ENCOUNTER — Encounter (HOSPITAL_COMMUNITY): Payer: Self-pay | Admitting: Anesthesiology

## 2012-05-23 ENCOUNTER — Inpatient Hospital Stay (HOSPITAL_COMMUNITY)
Admission: RE | Admit: 2012-05-23 | Discharge: 2012-05-26 | DRG: 470 | Disposition: A | Discharge status: Home Health | Payer: Medicare Other | Source: Ambulatory Visit | Attending: Orthopaedic Surgery | Admitting: Orthopaedic Surgery

## 2012-05-23 ENCOUNTER — Encounter (HOSPITAL_COMMUNITY)
Admission: RE | Disposition: A | Discharge status: Home Health | Payer: Self-pay | Source: Ambulatory Visit | Attending: Orthopaedic Surgery

## 2012-05-23 DIAGNOSIS — M1710 Unilateral primary osteoarthritis, unspecified knee: Principal | ICD-10-CM | POA: Diagnosis present

## 2012-05-23 DIAGNOSIS — F411 Generalized anxiety disorder: Secondary | ICD-10-CM | POA: Diagnosis present

## 2012-05-23 DIAGNOSIS — Z79899 Other long term (current) drug therapy: Secondary | ICD-10-CM

## 2012-05-23 DIAGNOSIS — M1610 Unilateral primary osteoarthritis, unspecified hip: Secondary | ICD-10-CM | POA: Diagnosis not present

## 2012-05-23 DIAGNOSIS — D62 Acute posthemorrhagic anemia: Secondary | ICD-10-CM | POA: Diagnosis not present

## 2012-05-23 DIAGNOSIS — Z6836 Body mass index (BMI) 36.0-36.9, adult: Secondary | ICD-10-CM

## 2012-05-23 DIAGNOSIS — I10 Essential (primary) hypertension: Secondary | ICD-10-CM | POA: Diagnosis present

## 2012-05-23 DIAGNOSIS — IMO0001 Reserved for inherently not codable concepts without codable children: Secondary | ICD-10-CM | POA: Diagnosis present

## 2012-05-23 DIAGNOSIS — K219 Gastro-esophageal reflux disease without esophagitis: Secondary | ICD-10-CM | POA: Diagnosis present

## 2012-05-23 DIAGNOSIS — F209 Schizophrenia, unspecified: Secondary | ICD-10-CM | POA: Diagnosis present

## 2012-05-23 DIAGNOSIS — M1711 Unilateral primary osteoarthritis, right knee: Secondary | ICD-10-CM

## 2012-05-23 DIAGNOSIS — Z96649 Presence of unspecified artificial hip joint: Secondary | ICD-10-CM

## 2012-05-23 DIAGNOSIS — E039 Hypothyroidism, unspecified: Secondary | ICD-10-CM | POA: Diagnosis present

## 2012-05-23 HISTORY — PX: TOTAL KNEE ARTHROPLASTY: SHX125

## 2012-05-23 LAB — GLUCOSE, CAPILLARY: Glucose-Capillary: 104 mg/dL — ABNORMAL HIGH (ref 70–99)

## 2012-05-23 LAB — TYPE AND SCREEN: ABO/RH(D): A POS

## 2012-05-23 SURGERY — ARTHROPLASTY, KNEE, TOTAL
Anesthesia: Spinal | Site: Knee | Laterality: Right | Wound class: Clean

## 2012-05-23 MED ORDER — METHOCARBAMOL 500 MG PO TABS
500.0000 mg | ORAL_TABLET | Freq: Four times a day (QID) | ORAL | Status: DC | PRN
  Administered 2012-05-23: 500 mg via ORAL
  Filled 2012-05-23 (×2): qty 1

## 2012-05-23 MED ORDER — LACTATED RINGERS IV SOLN: INTRAVENOUS | Status: DC

## 2012-05-23 MED ORDER — HYDROMORPHONE HCL PF 1 MG/ML IJ SOLN
0.2500 mg | INTRAMUSCULAR | Status: DC | PRN
  Administered 2012-05-23 (×6): 0.5 mg via INTRAVENOUS

## 2012-05-23 MED ORDER — LACTATED RINGERS IV SOLN
INTRAVENOUS | Status: DC | PRN
  Administered 2012-05-23 (×2): via INTRAVENOUS

## 2012-05-23 MED ORDER — MENTHOL 3 MG MT LOZG: 1.0000 | LOZENGE | OROMUCOSAL | Status: DC | PRN

## 2012-05-23 MED ORDER — ALPRAZOLAM 1 MG PO TABS
2.0000 mg | ORAL_TABLET | Freq: Two times a day (BID) | ORAL | Status: DC | PRN
  Administered 2012-05-23 – 2012-05-26 (×3): 2 mg via ORAL
  Filled 2012-05-23: qty 2
  Filled 2012-05-23: qty 1
  Filled 2012-05-23: qty 2
  Filled 2012-05-23: qty 1

## 2012-05-23 MED ORDER — SODIUM CHLORIDE 0.9 % IV SOLN
INTRAVENOUS | Status: DC
  Administered 2012-05-23: 75 mL/h via INTRAVENOUS
  Administered 2012-05-24 (×2): via INTRAVENOUS

## 2012-05-23 MED ORDER — DIPHENHYDRAMINE HCL 12.5 MG/5ML PO ELIX: 12.5000 mg | ORAL_SOLUTION | Freq: Four times a day (QID) | ORAL | Status: DC | PRN

## 2012-05-23 MED ORDER — AMLODIPINE BESYLATE 5 MG PO TABS
5.0000 mg | ORAL_TABLET | Freq: Two times a day (BID) | ORAL | Status: DC
  Administered 2012-05-23 – 2012-05-26 (×5): 5 mg via ORAL
  Filled 2012-05-23 (×7): qty 1

## 2012-05-23 MED ORDER — PROPOFOL INFUSION 10 MG/ML OPTIME
INTRAVENOUS | Status: DC | PRN
  Administered 2012-05-23: 75 ug/kg/min via INTRAVENOUS

## 2012-05-23 MED ORDER — PANTOPRAZOLE SODIUM 40 MG PO TBEC
80.0000 mg | DELAYED_RELEASE_TABLET | Freq: Every day | ORAL | Status: DC
  Administered 2012-05-24 – 2012-05-25 (×2): 80 mg via ORAL
  Filled 2012-05-23 (×3): qty 2

## 2012-05-23 MED ORDER — COLESEVELAM HCL 625 MG PO TABS
1875.0000 mg | ORAL_TABLET | Freq: Two times a day (BID) | ORAL | Status: DC
  Administered 2012-05-23 – 2012-05-26 (×6): 1875 mg via ORAL
  Filled 2012-05-23 (×8): qty 3

## 2012-05-23 MED ORDER — METHOCARBAMOL 100 MG/ML IJ SOLN
500.0000 mg | Freq: Four times a day (QID) | INTRAVENOUS | Status: DC | PRN
  Administered 2012-05-23: 500 mg via INTRAVENOUS
  Filled 2012-05-23 (×2): qty 5

## 2012-05-23 MED ORDER — HYDROMORPHONE HCL PF 1 MG/ML IJ SOLN: 0.2500 mg | INTRAMUSCULAR | Status: DC | PRN

## 2012-05-23 MED ORDER — DOXYCYCLINE HYCLATE 100 MG PO TABS
100.0000 mg | ORAL_TABLET | Freq: Two times a day (BID) | ORAL | Status: DC
  Administered 2012-05-23 – 2012-05-26 (×7): 100 mg via ORAL
  Filled 2012-05-23 (×8): qty 1

## 2012-05-23 MED ORDER — METOCLOPRAMIDE HCL 5 MG PO TABS
5.0000 mg | ORAL_TABLET | Freq: Three times a day (TID) | ORAL | Status: DC | PRN
  Filled 2012-05-23: qty 2

## 2012-05-23 MED ORDER — DOCUSATE SODIUM 100 MG PO CAPS
100.0000 mg | ORAL_CAPSULE | Freq: Two times a day (BID) | ORAL | Status: DC
  Administered 2012-05-23 – 2012-05-25 (×6): 100 mg via ORAL

## 2012-05-23 MED ORDER — ACETAMINOPHEN 325 MG PO TABS: 650.0000 mg | ORAL_TABLET | Freq: Four times a day (QID) | ORAL | Status: DC | PRN

## 2012-05-23 MED ORDER — HYDROMORPHONE 0.3 MG/ML IV SOLN
INTRAVENOUS | Status: DC
  Administered 2012-05-23: 0.3 mg via INTRAVENOUS
  Administered 2012-05-23: 2.1 mg via INTRAVENOUS
  Administered 2012-05-23: 1.2 mg via INTRAVENOUS
  Administered 2012-05-23: 0.3 mg via INTRAVENOUS
  Administered 2012-05-24: 0.9 mg via INTRAVENOUS
  Administered 2012-05-24: 1.2 mg via INTRAVENOUS
  Administered 2012-05-24: 0.6 mg via INTRAVENOUS
  Administered 2012-05-25: 0.3 mg via INTRAVENOUS
  Administered 2012-05-25: 06:00:00 via INTRAVENOUS
  Filled 2012-05-23: qty 25

## 2012-05-23 MED ORDER — DOXEPIN HCL 25 MG PO CAPS
75.0000 mg | ORAL_CAPSULE | Freq: Every day | ORAL | Status: DC
  Administered 2012-05-23 – 2012-05-25 (×3): 75 mg via ORAL
  Filled 2012-05-23 (×2): qty 3
  Filled 2012-05-23: qty 1
  Filled 2012-05-23: qty 3

## 2012-05-23 MED ORDER — ACETAMINOPHEN 10 MG/ML IV SOLN
1000.0000 mg | Freq: Four times a day (QID) | INTRAVENOUS | Status: DC
  Administered 2012-05-23: 1000 mg via INTRAVENOUS

## 2012-05-23 MED ORDER — SODIUM CHLORIDE 0.9 % IJ SOLN: 9.0000 mL | INTRAMUSCULAR | Status: DC | PRN

## 2012-05-23 MED ORDER — OXYCODONE HCL 5 MG PO TABS
5.0000 mg | ORAL_TABLET | ORAL | Status: DC | PRN
  Filled 2012-05-23: qty 2

## 2012-05-23 MED ORDER — NALOXONE HCL 0.4 MG/ML IJ SOLN: 0.4000 mg | INTRAMUSCULAR | Status: DC | PRN

## 2012-05-23 MED ORDER — CEFAZOLIN SODIUM 1-5 GM-% IV SOLN
1.0000 g | Freq: Four times a day (QID) | INTRAVENOUS | Status: AC
  Administered 2012-05-23 (×2): 1 g via INTRAVENOUS
  Filled 2012-05-23 (×2): qty 50

## 2012-05-23 MED ORDER — RIVAROXABAN 10 MG PO TABS
10.0000 mg | ORAL_TABLET | Freq: Every day | ORAL | Status: DC
  Administered 2012-05-24 – 2012-05-26 (×3): 10 mg via ORAL
  Filled 2012-05-23 (×4): qty 1

## 2012-05-23 MED ORDER — ESCITALOPRAM OXALATE 10 MG PO TABS
10.0000 mg | ORAL_TABLET | Freq: Every day | ORAL | Status: DC
  Administered 2012-05-24 – 2012-05-26 (×3): 10 mg via ORAL
  Filled 2012-05-23 (×4): qty 1

## 2012-05-23 MED ORDER — VANCOMYCIN HCL IN DEXTROSE 1-5 GM/200ML-% IV SOLN
1000.0000 mg | INTRAVENOUS | Status: AC
  Administered 2012-05-23: 1000 mg via INTRAVENOUS

## 2012-05-23 MED ORDER — DIPHENHYDRAMINE HCL 12.5 MG/5ML PO ELIX: 12.5000 mg | ORAL_SOLUTION | ORAL | Status: DC | PRN

## 2012-05-23 MED ORDER — METOCLOPRAMIDE HCL 5 MG/ML IJ SOLN: 5.0000 mg | Freq: Three times a day (TID) | INTRAMUSCULAR | Status: DC | PRN

## 2012-05-23 MED ORDER — ONDANSETRON HCL 4 MG PO TABS
4.0000 mg | ORAL_TABLET | Freq: Four times a day (QID) | ORAL | Status: DC | PRN
  Administered 2012-05-23 – 2012-05-26 (×3): 4 mg via ORAL
  Filled 2012-05-23 (×3): qty 1

## 2012-05-23 MED ORDER — HYDROMORPHONE HCL PF 1 MG/ML IJ SOLN: 1.0000 mg | INTRAMUSCULAR | Status: DC | PRN

## 2012-05-23 MED ORDER — PHENYLEPHRINE HCL 10 MG/ML IJ SOLN
10.0000 mg | INTRAVENOUS | Status: DC | PRN
  Administered 2012-05-23: 40 ug/min via INTRAVENOUS

## 2012-05-23 MED ORDER — ACETAMINOPHEN 10 MG/ML IV SOLN: 1000.0000 mg | Freq: Four times a day (QID) | INTRAVENOUS | Status: DC

## 2012-05-23 MED ORDER — STERILE WATER FOR IRRIGATION IR SOLN
Status: DC | PRN
  Administered 2012-05-23: 3000 mL

## 2012-05-23 MED ORDER — ALUM & MAG HYDROXIDE-SIMETH 200-200-20 MG/5ML PO SUSP: 30.0000 mL | ORAL | Status: DC | PRN

## 2012-05-23 MED ORDER — HYDROMORPHONE HCL 2 MG PO TABS
4.0000 mg | ORAL_TABLET | ORAL | Status: DC | PRN
  Administered 2012-05-25 – 2012-05-26 (×4): 4 mg via ORAL
  Filled 2012-05-23: qty 2
  Filled 2012-05-23 (×2): qty 1
  Filled 2012-05-23 (×2): qty 2

## 2012-05-23 MED ORDER — PHENOL 1.4 % MT LIQD: 1.0000 | OROMUCOSAL | Status: DC | PRN

## 2012-05-23 MED ORDER — LEVOTHYROXINE SODIUM 25 MCG PO TABS
25.0000 ug | ORAL_TABLET | Freq: Every day | ORAL | Status: DC
  Administered 2012-05-24 – 2012-05-26 (×3): 25 ug via ORAL
  Filled 2012-05-23 (×4): qty 1

## 2012-05-23 MED ORDER — ONDANSETRON HCL 4 MG/2ML IJ SOLN: 4.0000 mg | Freq: Four times a day (QID) | INTRAMUSCULAR | Status: DC | PRN

## 2012-05-23 MED ORDER — FERROUS SULFATE 325 (65 FE) MG PO TABS
325.0000 mg | ORAL_TABLET | Freq: Three times a day (TID) | ORAL | Status: DC
  Administered 2012-05-23 – 2012-05-26 (×8): 325 mg via ORAL
  Filled 2012-05-23 (×11): qty 1

## 2012-05-23 MED ORDER — BUPIVACAINE HCL (PF) 0.75 % IJ SOLN
INTRAMUSCULAR | Status: DC | PRN
  Administered 2012-05-23: 15 mg

## 2012-05-23 MED ORDER — MIDAZOLAM HCL 5 MG/5ML IJ SOLN
INTRAMUSCULAR | Status: DC | PRN
  Administered 2012-05-23: 2 mg via INTRAVENOUS

## 2012-05-23 MED ORDER — PROMETHAZINE HCL 25 MG/ML IJ SOLN: 6.2500 mg | INTRAMUSCULAR | Status: DC | PRN

## 2012-05-23 MED ORDER — LISINOPRIL 40 MG PO TABS
40.0000 mg | ORAL_TABLET | Freq: Every day | ORAL | Status: DC
  Administered 2012-05-24 – 2012-05-25 (×2): 40 mg via ORAL
  Filled 2012-05-23 (×3): qty 1

## 2012-05-23 MED ORDER — PSYLLIUM 0.52 G PO CAPS: 0.5200 g | ORAL_CAPSULE | Freq: Every day | ORAL | Status: DC

## 2012-05-23 MED ORDER — ZOLPIDEM TARTRATE 5 MG PO TABS: 5.0000 mg | ORAL_TABLET | Freq: Every evening | ORAL | Status: DC | PRN

## 2012-05-23 MED ORDER — CALCIUM POLYCARBOPHIL 625 MG PO TABS
625.0000 mg | ORAL_TABLET | Freq: Every day | ORAL | Status: DC
  Administered 2012-05-23 – 2012-05-26 (×4): 625 mg via ORAL
  Filled 2012-05-23 (×4): qty 1

## 2012-05-23 MED ORDER — SODIUM CHLORIDE 0.9 % IR SOLN
Status: DC | PRN
  Administered 2012-05-23: 1000 mL

## 2012-05-23 MED ORDER — ACETAMINOPHEN 650 MG RE SUPP: 650.0000 mg | Freq: Four times a day (QID) | RECTAL | Status: DC | PRN

## 2012-05-23 MED ORDER — DICYCLOMINE HCL 10 MG PO CAPS
10.0000 mg | ORAL_CAPSULE | Freq: Three times a day (TID) | ORAL | Status: DC | PRN
  Filled 2012-05-23: qty 1

## 2012-05-23 MED ORDER — DIPHENHYDRAMINE HCL 50 MG/ML IJ SOLN: 12.5000 mg | Freq: Four times a day (QID) | INTRAMUSCULAR | Status: DC | PRN

## 2012-05-23 MED ORDER — PROPOFOL 10 MG/ML IV BOLUS
INTRAVENOUS | Status: DC | PRN
  Administered 2012-05-23 (×2): 20 mg via INTRAVENOUS

## 2012-05-23 MED ORDER — ONDANSETRON HCL 4 MG/2ML IJ SOLN
4.0000 mg | Freq: Four times a day (QID) | INTRAMUSCULAR | Status: DC | PRN
  Administered 2012-05-24: 4 mg via INTRAVENOUS
  Filled 2012-05-23: qty 2

## 2012-05-23 MED ORDER — CEFAZOLIN SODIUM-DEXTROSE 2-3 GM-% IV SOLR
2.0000 g | INTRAVENOUS | Status: AC
  Administered 2012-05-23: 2 g via INTRAVENOUS

## 2012-05-23 MED ORDER — KETOROLAC TROMETHAMINE 15 MG/ML IJ SOLN
7.5000 mg | Freq: Four times a day (QID) | INTRAMUSCULAR | Status: AC
  Administered 2012-05-23: 7.5 mg via INTRAVENOUS
  Administered 2012-05-23: 12:00:00 via INTRAVENOUS
  Administered 2012-05-24 (×2): 7.5 mg via INTRAVENOUS
  Filled 2012-05-23 (×3): qty 1

## 2012-05-23 SURGICAL SUPPLY — 55 items
BAG ZIPLOCK 12X15 (MISCELLANEOUS) ×2 IMPLANT
BANDAGE ELASTIC 6 VELCRO ST LF (GAUZE/BANDAGES/DRESSINGS) ×2 IMPLANT
BANDAGE ESMARK 6X9 LF (GAUZE/BANDAGES/DRESSINGS) ×1 IMPLANT
BLADE SAG 18X100X1.27 (BLADE) ×2 IMPLANT
BLADE SAW SGTL 13.0X1.19X90.0M (BLADE) ×2 IMPLANT
BNDG ESMARK 6X9 LF (GAUZE/BANDAGES/DRESSINGS) ×2
BOWL SMART MIX CTS (DISPOSABLE) ×2 IMPLANT
CEMENT BONE 1-PACK (Cement) ×4 IMPLANT
CLOTH BEACON ORANGE TIMEOUT ST (SAFETY) ×2 IMPLANT
CUFF TOURN SGL QUICK 34 (TOURNIQUET CUFF) ×1
CUFF TRNQT CYL 34X4X40X1 (TOURNIQUET CUFF) ×1 IMPLANT
DRAPE EXTREMITY T 121X128X90 (DRAPE) ×2 IMPLANT
DRAPE LG THREE QUARTER DISP (DRAPES) ×2 IMPLANT
DRAPE POUCH INSTRU U-SHP 10X18 (DRAPES) ×2 IMPLANT
DRAPE U-SHAPE 47X51 STRL (DRAPES) ×2 IMPLANT
DRSG PAD ABDOMINAL 8X10 ST (GAUZE/BANDAGES/DRESSINGS) ×2 IMPLANT
DURAPREP 26ML APPLICATOR (WOUND CARE) ×2 IMPLANT
ELECT REM PT RETURN 9FT ADLT (ELECTROSURGICAL) ×2
ELECTRODE REM PT RTRN 9FT ADLT (ELECTROSURGICAL) ×1 IMPLANT
EVACUATOR 1/8 PVC DRAIN (DRAIN) ×2 IMPLANT
FACESHIELD LNG OPTICON STERILE (SAFETY) ×10 IMPLANT
GAUZE XEROFORM 5X9 LF (GAUZE/BANDAGES/DRESSINGS) ×2 IMPLANT
GLOVE BIO SURGEON STRL SZ7.5 (GLOVE) ×2 IMPLANT
GLOVE BIOGEL PI IND STRL 7.5 (GLOVE) ×1 IMPLANT
GLOVE BIOGEL PI IND STRL 8 (GLOVE) ×1 IMPLANT
GLOVE BIOGEL PI INDICATOR 7.5 (GLOVE) ×1
GLOVE BIOGEL PI INDICATOR 8 (GLOVE) ×1
GLOVE ECLIPSE 7.0 STRL STRAW (GLOVE) ×2 IMPLANT
GOWN STRL REIN XL XLG (GOWN DISPOSABLE) ×4 IMPLANT
HANDPIECE INTERPULSE COAX TIP (DISPOSABLE) ×1
IMMOBILIZER KNEE 20 (SOFTGOODS) ×2
IMMOBILIZER KNEE 20 THIGH 36 (SOFTGOODS) ×1 IMPLANT
KIT BASIN OR (CUSTOM PROCEDURE TRAY) ×2 IMPLANT
NS IRRIG 1000ML POUR BTL (IV SOLUTION) ×2 IMPLANT
PACK TOTAL JOINT (CUSTOM PROCEDURE TRAY) ×2 IMPLANT
PADDING CAST COTTON 6X4 STRL (CAST SUPPLIES) ×4 IMPLANT
POSITIONER SURGICAL ARM (MISCELLANEOUS) ×2 IMPLANT
SET HNDPC FAN SPRY TIP SCT (DISPOSABLE) ×1 IMPLANT
SET PAD KNEE POSITIONER (MISCELLANEOUS) ×2 IMPLANT
SPONGE GAUZE 4X4 12PLY (GAUZE/BANDAGES/DRESSINGS) ×2 IMPLANT
SPONGE LAP 18X18 X RAY DECT (DISPOSABLE) ×2 IMPLANT
STAPLER VISISTAT 35W (STAPLE) ×2 IMPLANT
SUCTION FRAZIER 12FR DISP (SUCTIONS) ×2 IMPLANT
SUT VIC AB 0 CT1 27 (SUTURE)
SUT VIC AB 0 CT1 27XBRD ANTBC (SUTURE) IMPLANT
SUT VIC AB 1 CT1 27 (SUTURE) ×1
SUT VIC AB 1 CT1 27XBRD ANTBC (SUTURE) ×1 IMPLANT
SUT VIC AB 2-0 CT1 27 (SUTURE) ×3
SUT VIC AB 2-0 CT1 TAPERPNT 27 (SUTURE) ×3 IMPLANT
SUT VLOC 180 0 24IN GS25 (SUTURE) ×2 IMPLANT
TOWEL OR 17X26 10 PK STRL BLUE (TOWEL DISPOSABLE) ×4 IMPLANT
TOWEL OR NON WOVEN STRL DISP B (DISPOSABLE) ×2 IMPLANT
TRAY FOLEY CATH 14FRSI W/METER (CATHETERS) ×2 IMPLANT
WATER STERILE IRR 1500ML POUR (IV SOLUTION) ×2 IMPLANT
WRAP KNEE MAXI GEL POST OP (GAUZE/BANDAGES/DRESSINGS) ×2 IMPLANT

## 2012-05-23 NOTE — Anesthesia Procedure Notes (Signed)
Spinal  Patient location during procedure: OR Start time: 05/23/2012 10:00 AM End time: 05/23/2012 10:05 AM Staffing CRNA/Resident: Paris Lore Performed by: resident/CRNA  Preanesthetic Checklist Completed: patient identified, site marked, surgical consent, pre-op evaluation, timeout performed, IV checked, risks and benefits discussed and monitors and equipment checked Spinal Block Patient position: sitting Prep: Betadine Patient monitoring: heart rate, continuous pulse ox and blood pressure Approach: right paramedian Location: L2-3 Injection technique: single-shot Needle Needle type: Spinocan  Needle gauge: 22 G Needle length: 9 cm Needle insertion depth: 5 cm Assessment Sensory level: T4 Additional Notes Expiration date of kit checked and confirmed. Patient tolerated procedure well, without complications. Sitting position X 1 attempt noted CSF return and easy administration of medication.
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
## 2012-05-23 NOTE — Transfer of Care (Signed)
Immediate Anesthesia Transfer of Care Note  Patient: Margaret Cowan  Procedure(s) Performed: Procedure(s) (LRB): TOTAL KNEE ARTHROPLASTY (Right)  Patient Location: PACU  Anesthesia Type: Regional  Level of Consciousness: sedated, patient cooperative and responds to stimulaton  Airway & Oxygen Therapy: Patient Spontanous Breathing and Patient connected to face mask oxgen  Post-op Assessment: Report given to PACU RN and Post -op Vital signs reviewed and stable  Post vital signs: Reviewed and stable  Complications: No apparent anesthesia complications

## 2012-05-23 NOTE — Anesthesia Preprocedure Evaluation (Addendum)
Anesthesia Evaluation  Patient identified by MRN, date of birth, ID band Patient awake    Reviewed: Allergy & Precautions, H&P , NPO status , Patient's Chart, lab work & pertinent test results  Airway Mallampati: II TM Distance: >3 FB Neck ROM: Full    Dental no notable dental hx. (+) Teeth Intact and Caps,    Pulmonary neg pulmonary ROS, shortness of breath, sleep apnea and Continuous Positive Airway Pressure Ventilation ,  breath sounds clear to auscultation  Pulmonary exam normal       Cardiovascular hypertension, Pt. on medications - angina+CHF negative cardio ROS  Rhythm:Regular Rate:Normal     Neuro/Psych  Headaches, PSYCHIATRIC DISORDERS (schizoaffective disorder) Anxiety Schizoaffective disorderChronic low back pain; narcotic dependence.  Neuromuscular disease negative neurological ROS  negative psych ROS   GI/Hepatic negative GI ROS, Neg liver ROS, GERD-  ,  Endo/Other  negative endocrine ROSHypothyroidism Morbid obesity  Renal/GU negative Renal ROS  negative genitourinary   Musculoskeletal negative musculoskeletal ROS (+) Fibromyalgia -, narcotic dependent  Abdominal   Peds negative pediatric ROS (+)  Hematology negative hematology ROS (+)   Anesthesia Other Findings Upper front left cap. Note, patient asked for Dilaudid multiple times before being taken back to OR.  Reproductive/Obstetrics negative OB ROS                          Anesthesia Physical Anesthesia Plan  ASA: III  Anesthesia Plan: Spinal   Post-op Pain Management:    Induction: Intravenous  Airway Management Planned: Simple Face Mask  Additional Equipment:   Intra-op Plan:   Post-operative Plan:   Informed Consent: I have reviewed the patients History and Physical, chart, labs and discussed the procedure including the risks, benefits and alternatives for the proposed anesthesia with the patient or  authorized representative who has indicated his/her understanding and acceptance.   Dental advisory given  Plan Discussed with: CRNA  Anesthesia Plan Comments:         Anesthesia Quick Evaluation

## 2012-05-23 NOTE — Progress Notes (Signed)
CPAP setup for pt to use at night. She is unaware of her settings, so placed in auto titration mode 5-20 cmH2O. She has her circuit/mask from home, so hooked to our machine. Pt will place on herself when ready. Instructed to call if she has any problems.  Jacqulynn Cadet RRT

## 2012-05-23 NOTE — Progress Notes (Signed)
UR COMPLETED  

## 2012-05-23 NOTE — Brief Op Note (Signed)
05/23/2012  11:49 AM  PATIENT:  Margaret Cowan  62 y.o. female  PRE-OPERATIVE DIAGNOSIS:  Severe osteoarthritis right knee  POST-OPERATIVE DIAGNOSIS:  Severe osteoarthritis right knee  PROCEDURE:  Procedure(s) with comments: TOTAL KNEE ARTHROPLASTY (Right) - Right Total Knee Arthroplasty  SURGEON:  Surgeon(s) and Role:    * Kathryne Hitch, MD - Primary  PHYSICIAN ASSISTANT:   ASSISTANTS: none   ANESTHESIA:   spinal  EBL:  Total I/O In: 1000 [I.V.:1000] Out: -   BLOOD ADMINISTERED:none  DRAINS: (medium) Hemovact drain(s) in the knee with  Suction Open   LOCAL MEDICATIONS USED:  NONE  SPECIMEN:  No Specimen  DISPOSITION OF SPECIMEN:  N/A  COUNTS:  YES  TOURNIQUET:   Total Tourniquet Time Documented: Thigh (Right) - 54 minutes Total: Thigh (Right) - 54 minutes   DICTATION: .Other Dictation: Dictation Number [id number]  PLAN OF CARE: Admit to inpatient   PATIENT DISPOSITION:  PACU - hemodynamically stable.   Delay start of Pharmacological VTE agent (>24hrs) due to surgical blood loss or risk of bleeding: no

## 2012-05-24 LAB — CBC
HCT: 29.1 % — ABNORMAL LOW (ref 36.0–46.0)
Hemoglobin: 9.5 g/dL — ABNORMAL LOW (ref 12.0–15.0)
RBC: 3.26 MIL/uL — ABNORMAL LOW (ref 3.87–5.11)
WBC: 7.5 10*3/uL (ref 4.0–10.5)

## 2012-05-24 LAB — BASIC METABOLIC PANEL
BUN: 6 mg/dL (ref 6–23)
CO2: 28 mEq/L (ref 19–32)
Chloride: 103 mEq/L (ref 96–112)
GFR calc non Af Amer: >90 mL/min (ref >90)
Glucose, Bld: 135 mg/dL — ABNORMAL HIGH (ref 70–99)
Potassium: 3.8 mEq/L (ref 3.5–5.1)
Sodium: 136 mEq/L (ref 135–145)

## 2012-05-24 NOTE — Progress Notes (Signed)
RT checked on patient who was already wearing CPAP. Patient and another person in the room were asleep. Patient was resting comfortably with home full face mask and tubing. RN notified to call if any assistance is needed.

## 2012-05-24 NOTE — Op Note (Signed)
Margaret Cowan, Margaret Cowan NO.:  [id number]  MEDICAL RECORD NO.:  [id number]  LOCATION:  1606                         FACILITY:  [facility]  PHYSICIAN:  Vanita Panda. Magnus Ivan, M.D.DATE OF BIRTH:  [birth year]  DATE OF PROCEDURE:  05/23/2012 DATE OF DISCHARGE:                              OPERATIVE REPORT   PREOPERATIVE DIAGNOSIS:  End-stage arthritis and degenerative joint disease, right knee.  POSTOPERATIVE DIAGNOSIS:  End-stage arthritis and degenerative joint disease, right knee.  PROCEDURE:  Right total knee arthroplasty.  IMPLANTS:  Stryker triathlon knee with size 2 femur, size 2 tibial tray, 9 mm polyethylene insert, size 29 patellar button.  SURGEON:  Vanita Panda. Magnus Ivan, M.D.  ANESTHESIA:  Spinal.  ANTIBIOTICS:  1 g vancomycin and 2 g IV Ancef.  ESTIMATED BLOOD LOSS:  100 mL.  COMPLICATIONS:  None.  TOURNIQUET TIME:  53 minutes.  INDICATIONS:  Margaret Cowan is a 61 year old chronic pain patient who is well known to me.  She has had an MRI in the past of her right knee that showed significant wear of the medial compartment of her knee and a medial meniscal tear as well as patellofemoral arthritis and __________ knee.  Several months ago, she underwent a right knee arthroscopic surgery and intervention of the partial medial meniscectomy.  We found areas of full-thickness cartilage loss in her knee.  She has tried to get around on this knee for the last several months, and has failed conservative treatment.  She has tried assistive devices, anti- inflammatory medications, injections in her knee, and she has recurrent effusion, and it had a profound impact on her activities of daily living and her mobility in general.  Due to the severe pain she is having, even pain control was having a problem covering this, she wished to proceed with a total knee arthroplasty, and I believe this is warranted now given the fact of the case and how she has presented.   The risks and benefits of surgery were explained to her in detail, and she does wish to proceed.  PROCEDURE DESCRIPTION:  After informed consent was obtained, the appropriate right knee was marked.  She was brought to the operating room, and spinal anesthesia was obtained.  She was then placed in the supine position.  A Foley catheter was placed, and a nonsterile tourniquet was placed on her upper right thigh.  Her right leg was then prepped and draped with DuraPrep and sterile drapes including sterile stockinette.  A time-out was called, and she was identified as correct patient and correct right knee.  She was already given her preoperative antibiotics.  She has had a previous right hip replacement and screened positive for MRSA.  We then used an Esmarch wrap out the leg, and tourniquet was inflated to 300 mm of pressure.  We made a midline incision directly over the patella and carried this proximally and distally.  I dissected down the knee joint and then performed a medial parapatellar arthrotomy.  Here, we found a large knee effusion in the knee and the medial compartment of her knee almost devoid of cartilage. In the patellofemoral area, there were loss of cartilage and some  on the lateral compartment.  We then removed remnants of the medial and lateral meniscus.  There was chronic tearing in the ACL and PCL, and proceeded with a knee replacement.  First, the we set our tibial cutting guide for neutral varus valgus and 0 slope.  We took 9 mm off the high side, made our tibial cut.  We then went and placed intramedullary guide in the femur and drilled holes for this.  We then chose a 5-degree right femur with a 10 mm distal resection.  We then made our distal femoral cut and brought the knee back down into extension with extension block of 9 mm. She was completely straight.  We then went back to the femur and based on the epicondylar axis, placed our sizing guide.  We chose a size  2 femur.  We then put the size 2, 4-in-1 cutting guide and cut her anterior posterior cuts and her chamfer cuts.  We then made our femoral box cut based on a size 2 and drilled lugs for this.  We then backed to the tibia and trialed and sized a size 2 tibia.  We secured the tibial tray into place and then punched the keel for this.  With trial components in place, I trialed a 9-mm polyethylene insert, and she had full flexion, extension, and was stable with the straighter knee.  We then cut about 9 mm off the patella using oscillating saw and drilled holes for patella button.  We then removed all trial components, copiously irrigated the knee with 1.5 L normal saline solution using pulsatile lavage.  We then cemented the real Stryker triathlon knee, size 2 tibia followed by the 2 femur.  I placed the real 9 mm polyethylene insert and cemented the patellar button.  We then removed cement debris throughout the knee and let the tourniquet strap until the cement had hardened.  Once it had hardened, we let the tourniquet down, and hemostasis was obtained with electrocautery.  She had a decent amount of oozing from the bone, but no significant bleeders.  We then placed a medium Hemovac drain in the arthrotomy and closed the arthrotomy with interrupted #1 Vicryl suture followed by running 0 V-Loc suture, 2-0 Vicryl on the subcutaneous tissues, and staples on the skin. Xeroform, a well-padded sterile dressing was applied.  She was taken off the operating table to the recovery room in stable condition.  All final counts were correct.  There were no operative complications noted.     Vanita Panda. Magnus Ivan, M.D.     CYB/MEDQ  D:  05/23/2012  T:  05/23/2012  Job:  [id number]

## 2012-05-24 NOTE — Evaluation (Signed)
Physical Therapy Evaluation Patient Details Name: Margaret Cowan MRN: [id number] DOB: May 14, 1950 Today's Date: 05/24/2012 Time: 8657-8469 PT Time Calculation (min): 38 min  PT Assessment / Plan / Recommendation Clinical Impression  Pt s/p R TKR presents with decreased R LE strength/ROM and post op pain limiting functional mobility    PT Assessment  Patient needs continued PT services    Follow Up Recommendations  Home health PT    Does the patient have the potential to tolerate intense rehabilitation      Barriers to Discharge None      Equipment Recommendations  None recommended by PT    Recommendations for Other Services OT consult   Frequency 7X/week    Precautions / Restrictions Precautions Precautions: Knee;Fall Required Braces or Orthoses: Knee Immobilizer - Right Knee Immobilizer - Right: On when out of bed or walking;Discontinue once straight leg raise with < 10 degree lag Restrictions Weight Bearing Restrictions: No Other Position/Activity Restrictions: WBAT   Pertinent Vitals/Pain       Mobility  Bed Mobility Bed Mobility: Supine to Sit Supine to Sit: 3: Mod assist Details for Bed Mobility Assistance: cues for sequence and use of L LE to self assist Transfers Transfers: Sit to Stand;Stand to Sit Sit to Stand: 3: Mod assist;From bed Stand to Sit: 3: Mod assist;To chair/3-in-1;With upper extremity assist Details for Transfer Assistance: cues for LE management and use of UEs to self assist Ambulation/Gait Ambulation/Gait Assistance: 1: +2 Total assist Ambulation/Gait: Patient Percentage: 70% Ambulation Distance (Feet): 38 Feet Assistive device: Rolling walker Ambulation/Gait Assistance Details: cues for sequence, posture, position from RW, stride length Gait Pattern: Step-to pattern;Decreased step length - right;Decreased step length - left;Decreased stance time - left    Exercises Total Joint Exercises Ankle Circles/Pumps: AROM;10  reps;Supine;Both Quad Sets: AROM;10 reps;Both;Supine Heel Slides: AAROM;10 reps;Supine;Right Straight Leg Raises: AAROM;Right;10 reps;Supine   PT Diagnosis: Difficulty walking  PT Problem List: Decreased strength;Decreased range of motion;Decreased activity tolerance;Decreased mobility;Decreased knowledge of use of DME;Obesity;Pain PT Treatment Interventions: DME instruction;Gait training;Stair training;Functional mobility training;Therapeutic activities;Therapeutic exercise;Patient/family education   PT Goals Acute Rehab PT Goals PT Goal Formulation: With patient Time For Goal Achievement: 05/29/12 Potential to Achieve Goals: Good Pt will go Supine/Side to Sit: with supervision PT Goal: Supine/Side to Sit - Progress: Goal set today Pt will go Sit to Supine/Side: with supervision PT Goal: Sit to Supine/Side - Progress: Goal set today Pt will go Sit to Stand: with supervision PT Goal: Sit to Stand - Progress: Goal set today Pt will go Stand to Sit: with supervision PT Goal: Stand to Sit - Progress: Goal set today Pt will Ambulate: 51 - 150 feet;with supervision;with rolling walker PT Goal: Ambulate - Progress: Goal set today  Visit Information  Last PT Received On: 05/24/12 Assistance Needed: +2    Subjective Data  Subjective: I can't move my leg - its just so heavy Patient Stated Goal: Resume previous lifestyle with decreased pain   Prior Functioning  Home Living Lives With: Daughter Available Help at Discharge: Family Type of Home: Apartment Home Access: Level entry Home Layout: One level Home Adaptive Equipment: Environmental consultant - standard Prior Function Level of Independence: Independent Able to Take Stairs?: Yes Communication Communication: No difficulties    Cognition  Cognition Overall Cognitive Status: Appears within functional limits for tasks assessed/performed Arousal/Alertness: Awake/alert Orientation Level: Appears intact for tasks assessed Behavior During  Session: [facility] for tasks performed    Extremity/Trunk Assessment Right Upper Extremity Assessment RUE ROM/Strength/Tone: [facility] for tasks assessed  Left Upper Extremity Assessment LUE ROM/Strength/Tone: WFL for tasks assessed Right Lower Extremity Assessment RLE ROM/Strength/Tone: Deficits RLE ROM/Strength/Tone Deficits: 2+/5 quads with AAROM at knee - 10 - 35 Left Lower Extremity Assessment LLE ROM/Strength/Tone: WFL for tasks assessed   Balance    End of Session PT - End of Session Equipment Utilized During Treatment: Right knee immobilizer;Gait belt Activity Tolerance: Patient tolerated treatment well Patient left: in chair;with call bell/phone within reach;with family/visitor present Nurse Communication: Mobility status  GP     Kla Bily 05/24/2012, 2:34 PM

## 2012-05-24 NOTE — Progress Notes (Signed)
Called back to adjust cpap because pt felt like she wasn't getting enough air. Placed on cpap 10 cmH2O & she said a little more would do it. Placed on 12 cmH2O & she said that felt good.   Jacqulynn Cadet RRT

## 2012-05-24 NOTE — Progress Notes (Signed)
Physical Therapy Treatment Patient Details Name: Margaret Cowan MRN: [id number] DOB: 08-08-1950 Today's Date: 05/24/2012 Time: 4098-1191 PT Time Calculation (min): 23 min  PT Assessment / Plan / Recommendation Comments on Treatment Session  Mod assist with  bed mobility to position in L side ly with support to R LE and back - pt positioned for comfort 2* back pain     Follow Up Recommendations  Home health PT     Does the patient have the potential to tolerate intense rehabilitation     Barriers to Discharge None      Equipment Recommendations  None recommended by PT    Recommendations for Other Services OT consult  Frequency 7X/week   Plan Discharge plan remains appropriate    Precautions / Restrictions Precautions Precautions: Knee;Fall Required Braces or Orthoses: Knee Immobilizer - Right Knee Immobilizer - Right: On when out of bed or walking;Discontinue once straight leg raise with < 10 degree lag Restrictions Weight Bearing Restrictions: No Other Position/Activity Restrictions: WBAT   Pertinent Vitals/Pain     Mobility  Bed Mobility Bed Mobility: Sit to Supine Supine to Sit: 3: Mod assist Sit to Supine: 3: Mod assist Details for Bed Mobility Assistance: cues for sequence and use of L LE to self assist Transfers Transfers: Sit to Stand;Stand to Sit Sit to Stand: 3: Mod assist;With upper extremity assist;From chair/3-in-1 Stand to Sit: 3: Mod assist;To bed;With upper extremity assist Details for Transfer Assistance: cues for LE management and use of UEs to self assist Ambulation/Gait Ambulation/Gait Assistance: 3: Mod assist Ambulation/Gait: Patient Percentage: 70% Ambulation Distance (Feet): 11 Feet Assistive device: Rolling walker Ambulation/Gait Assistance Details: cues for posture, sequence, and position from RW Gait Pattern: Step-to pattern;Decreased step length - right;Decreased step length - left;Decreased stance time - left General Gait Details: LTd by  c/o back pain and fatigue    Exercises Total Joint Exercises Ankle Circles/Pumps: AROM;10 reps;Supine;Both Quad Sets: AROM;10 reps;Both;Supine Heel Slides: AAROM;10 reps;Supine;Right Straight Leg Raises: AAROM;Right;10 reps;Supine   PT Diagnosis: Difficulty walking  PT Problem List: Decreased strength;Decreased range of motion;Decreased activity tolerance;Decreased mobility;Decreased knowledge of use of DME;Obesity;Pain PT Treatment Interventions: DME instruction;Gait training;Stair training;Functional mobility training;Therapeutic activities;Therapeutic exercise;Patient/family education   PT Goals Acute Rehab PT Goals PT Goal Formulation: With patient Time For Goal Achievement: 05/29/12 Potential to Achieve Goals: Good Pt will go Supine/Side to Sit: with supervision PT Goal: Supine/Side to Sit - Progress: Goal set today Pt will go Sit to Supine/Side: with supervision PT Goal: Sit to Supine/Side - Progress: Goal set today Pt will go Sit to Stand: with supervision PT Goal: Sit to Stand - Progress: Goal set today Pt will go Stand to Sit: with supervision PT Goal: Stand to Sit - Progress: Goal set today Pt will Ambulate: 51 - 150 feet;with supervision;with rolling walker PT Goal: Ambulate - Progress: Goal set today  Visit Information  Last PT Received On: 05/24/12 Assistance Needed: +1    Subjective Data  Subjective: I think my back hurts worse than my knee, I wish I could sleep on my side Patient Stated Goal: Resume previous lifestyle with decreased pain   Cognition  Cognition Overall Cognitive Status: Appears within functional limits for tasks assessed/performed Arousal/Alertness: Awake/alert Orientation Level: Appears intact for tasks assessed Behavior During Session: [facility] for tasks performed    Balance     End of Session PT - End of Session Equipment Utilized During Treatment: Right knee immobilizer;Gait belt Activity Tolerance: Patient limited by pain;Patient limited  by fatigue Patient  left: in bed;with call bell/phone within reach;with family/visitor present Nurse Communication: Mobility status   GP     Esvin Hnat 05/24/2012, 2:42 PM

## 2012-05-24 NOTE — Progress Notes (Signed)
Subjective: 1 Day Post-Op Procedure(s) (LRB): TOTAL KNEE ARTHROPLASTY (Right) Patient reports pain as moderate.  Asymptomatic acute blood loss anemia.  Objective: Vital signs in last 24 hours: Temp:  [97.6 F (36.4 C)-99.1 F (37.3 C)] 99.1 F (37.3 C) (02/22 1046) Pulse Rate:  [53-86] 82 (02/22 1046) Resp:  [12-23] 16 (02/22 1046) BP: (93-137)/(52-84) 130/74 mmHg (02/22 1046) SpO2:  [96 %-100 %] 99 % (02/22 1046) Weight:  [95.255 kg (210 lb)] 95.255 kg (210 lb) (02/21 1346)  Intake/Output from previous day: 02/21 0701 - 02/22 0700 In: 3597.5 [P.O.:960; I.V.:2637.5] Out: 3385 [Urine:2825; Drains:560] Intake/Output this shift: Total I/O In: 240 [P.O.:240] Out: 550 [Urine:500; Drains:50]   Recent Labs  05/24/12 0433  HGB 9.5*    Recent Labs  05/24/12 0433  WBC 7.5  RBC 3.26*  HCT 29.1*  PLT 218    Recent Labs  05/24/12 0433  NA 136  K 3.8  CL 103  CO2 28  BUN 6  CREATININE 0.49*  GLUCOSE 135*  CALCIUM 8.2*   No results found for this basename: LABPT, INR,  in the last 72 hours  Sensation intact distally Intact pulses distally Dorsiflexion/Plantar flexion intact Incision: dressing C/D/I Compartment soft  Assessment/Plan: 1 Day Post-Op Procedure(s) (LRB): TOTAL KNEE ARTHROPLASTY (Right) Up with therapy  BLACKMAN,CHRISTOPHER Y 05/24/2012, 11:12 AM

## 2012-05-25 LAB — CBC
HCT: 27.5 % — ABNORMAL LOW (ref 36.0–46.0)
Hemoglobin: 8.9 g/dL — ABNORMAL LOW (ref 12.0–15.0)
MCH: 29 pg (ref 26.0–34.0)
MCHC: 32.4 g/dL (ref 30.0–36.0)
RBC: 3.07 MIL/uL — ABNORMAL LOW (ref 3.87–5.11)

## 2012-05-25 MED ORDER — MUPIROCIN 2 % EX OINT
1.0000 "application " | TOPICAL_OINTMENT | Freq: Two times a day (BID) | CUTANEOUS | Status: DC
  Administered 2012-05-26: 1 via NASAL

## 2012-05-25 MED ORDER — POLYETHYLENE GLYCOL 3350 17 G PO PACK
17.0000 g | PACK | Freq: Every day | ORAL | Status: DC
  Administered 2012-05-25: 17 g via ORAL

## 2012-05-25 NOTE — Progress Notes (Signed)
Physical Therapy Treatment Patient Details Name: Margaret Cowan MRN: [id number] DOB: Apr 28, 1950 Today's Date: 05/25/2012 Time: 4696-2952 PT Time Calculation (min): 28 min  PT Assessment / Plan / Recommendation Comments on Treatment Session  Progressing well with mobility. Noted increased HR with amb-125 bpm. Pt tolerated increased activity well. Recommend HHPT.     Follow Up Recommendations  Home health PT     Does the patient have the potential to tolerate intense rehabilitation     Barriers to Discharge        Equipment Recommendations  None recommended by PT    Recommendations for Other Services    Frequency 7X/week   Plan Discharge plan remains appropriate    Precautions / Restrictions Precautions Precautions: Knee;Fall Required Braces or Orthoses: Knee Immobilizer - Right Knee Immobilizer - Right: On when out of bed or walking;Discontinue once straight leg raise with < 10 degree lag Restrictions Weight Bearing Restrictions: No RLE Weight Bearing: Weight bearing as tolerated   Pertinent Vitals/Pain 5/10 back; 3/10 R knee    Mobility  Bed Mobility Bed Mobility: Supine to Sit;Sit to Supine Supine to Sit: 4: Min assist;HOB elevated;With rails Sit to Supine: 4: Min assist;HOB elevated;With rail Details for Bed Mobility Assistance: Assist for R LE off bed.  Transfers Transfers: Sit to Stand;Stand to Sit Sit to Stand: 4: Min assist;From bed Stand to Sit: 4: Min guard;To bed Details for Transfer Assistance: cues for LE management and use of UEs to self assist. Assist to rise, stabilize.  Ambulation/Gait Ambulation/Gait Assistance: 4: Min guard Ambulation Distance (Feet): 85 Feet Assistive device: Rolling walker Ambulation/Gait Assistance Details: VCS safety, sequence. s Gait Pattern: Step-to pattern;Antalgic;Decreased stride length;Decreased step length - right;Decreased step length - left    Exercises Total Joint Exercises Ankle Circles/Pumps: AROM;Both;10  reps;Supine Quad Sets: AROM;Both;10 reps;Supine Short Arc Quad: AAROM;AROM;Right;10 reps;Supine Hip ABduction/ADduction: AAROM;Right;10 reps;Supine Straight Leg Raises: AAROM;Right;10 reps;Supine   PT Diagnosis:    PT Problem List:   PT Treatment Interventions:     PT Goals Acute Rehab PT Goals Pt will go Supine/Side to Sit: with supervision PT Goal: Supine/Side to Sit - Progress: Progressing toward goal Pt will go Sit to Supine/Side: with supervision PT Goal: Sit to Supine/Side - Progress: Progressing toward goal Pt will go Sit to Stand: with supervision PT Goal: Sit to Stand - Progress: Progressing toward goal Pt will go Stand to Sit: with supervision PT Goal: Stand to Sit - Progress: Progressing toward goal Pt will Ambulate: 51 - 150 feet;with supervision;with rolling walker PT Goal: Ambulate - Progress: Progressing toward goal  Visit Information  Last PT Received On: 05/25/12 Assistance Needed: +1    Subjective Data  Subjective: I just can't move this leg yet Patient Stated Goal: Resume previous lifestyle with decreased pain   Cognition       Balance     End of Session PT - End of Session Equipment Utilized During Treatment: Gait belt;Right knee immobilizer Activity Tolerance: Patient tolerated treatment well Patient left: in bed;with call bell/phone within reach;with family/visitor present CPM Right Knee CPM Right Knee: Off   GP     Rebeca Alert [facility] [place] 05/25/2012, 10:22 AM [phone]

## 2012-05-25 NOTE — Progress Notes (Signed)
Patient ID: Margaret Cowan, female   DOB: 07-03-1950, 62 y.o.   MRN: [id number] Postoperative day 2 right total knee arthroplasty. Patient has no complaints this morning. PCA IV and continuous pulse oximetry discontinued.

## 2012-05-25 NOTE — Progress Notes (Signed)
Physical Therapy Treatment Patient Details Name: Margaret Cowan MRN: [id number] DOB: Nov 10, 1950 Today's Date: 05/25/2012 Time: 0865-7846 PT Time Calculation (min): 15 min  PT Assessment / Plan / Recommendation Comments on Treatment Session  Positioned pt on her L side at pt's request due to back pain-sidelying is position of comfort-placed pillow b/t knees for comfort/support. Pt continuing to mobilize well. Recommend HHPT.    Follow Up Recommendations  Home health PT     Does the patient have the potential to tolerate intense rehabilitation     Barriers to Discharge        Equipment Recommendations  None recommended by PT    Recommendations for Other Services    Frequency 7X/week   Plan Discharge plan remains appropriate    Precautions / Restrictions Precautions Precautions: Knee;Fall Required Braces or Orthoses: Knee Immobilizer - Right Knee Immobilizer - Right: On when out of bed or walking;Discontinue once straight leg raise with < 10 degree lag Restrictions Weight Bearing Restrictions: No RLE Weight Bearing: Weight bearing as tolerated   Pertinent Vitals/Pain 5/10 back; 4/10 R knee    Mobility  Bed Mobility Bed Mobility: Supine to Sit;Sit to Supine Supine to Sit: 4: Min assist Sit to Supine: 4: Min assist Details for Bed Mobility Assistance: Assist for R LE off/onto bed.  Transfers Transfers: Sit to Stand;Stand to Sit Sit to Stand: 4: Min guard;From bed Stand to Sit: 4: Min guard;To bed Details for Transfer Assistance: cues for LE management and use of UEs to self assist.  Ambulation/Gait Ambulation/Gait Assistance: 4: Min guard Ambulation Distance (Feet): 85 Feet Assistive device: Rolling walker Gait Pattern: Step-to pattern;Antalgic;Decreased stride length;Decreased step length - right;Decreased step length - left    Exercises     PT Diagnosis:    PT Problem List:   PT Treatment Interventions:     PT Goals Acute Rehab PT Goals Pt will go Supine/Side to  Sit: with supervision PT Goal: Supine/Side to Sit - Progress: Progressing toward goal Pt will go Sit to Supine/Side: with supervision PT Goal: Sit to Supine/Side - Progress: Progressing toward goal Pt will go Sit to Stand: with supervision PT Goal: Sit to Stand - Progress: Progressing toward goal Pt will go Stand to Sit: with supervision PT Goal: Stand to Sit - Progress: Progressing toward goal Pt will Ambulate: 51 - 150 feet;with supervision;with rolling walker PT Goal: Ambulate - Progress: Progressing toward goal  Visit Information  Last PT Received On: 05/25/12 Assistance Needed: +1    Subjective Data  Subjective: I think my back wil feel better if Im on my side Patient Stated Goal: Resume previous lifestyle with decreased pain   Cognition  Cognition Overall Cognitive Status: Appears within functional limits for tasks assessed/performed Arousal/Alertness: Awake/Cowan Orientation Level: Appears intact for tasks assessed Behavior During Session: [facility] for tasks performed    Balance     End of Session PT - End of Session Equipment Utilized During Treatment: Right knee immobilizer Activity Tolerance: Patient tolerated treatment well Patient left: in bed;with call bell/phone within reach   GP     Margaret Cowan [facility] 05/25/2012, 3:23 PM [phone]

## 2012-05-25 NOTE — Anesthesia Postprocedure Evaluation (Signed)
Anesthesia Post Note  Patient: Margaret Cowan  Procedure(s) Performed: Procedure(s) (LRB): TOTAL KNEE ARTHROPLASTY (Right)  Anesthesia type: Spinal  Patient location: PACU  Post pain: Pain level controlled  Post assessment: Post-op Vital signs reviewed  Last Vitals:  Filed Vitals:   05/25/12 0800  BP:   Pulse:   Temp:   Resp: 20    Post vital signs: Reviewed  Level of consciousness: sedated  Complications: No apparent anesthesia complications

## 2012-05-25 NOTE — Progress Notes (Signed)
RT assisted patient with CPAP. Increased pressure to 14cm H2O for patient comfort. She has her own mask and tubing. Patient states she would like to put herself on later. Sterile water added to fill line. Another female at bedside, RN aware. RT encouraged patient to call if she needed any further assistance.

## 2012-05-25 NOTE — Evaluation (Signed)
Occupational Therapy Evaluation Patient Details Name: Margaret Cowan MRN: [id number] DOB: 1950-05-02 Today's Date: 05/25/2012 Time: 4098-1191 OT Time Calculation (min): 27 min  OT Assessment / Plan / Recommendation Clinical Impression  Pt presents to OT s/p R TKR with decreased I with ADL activity. Pt will benefit from skilled OT to increase I with ADL activity and return to PLOF and decrease burden on family    OT Assessment  Patient needs continued OT Services    Follow Up Recommendations  Home health OT       Equipment Recommendations  Tub/shower bench;Other (comment) (family will obtain)       Frequency  Min 2X/week    Precautions / Restrictions Precautions Precautions: Knee;Fall Required Braces or Orthoses: Knee Immobilizer - Right Knee Immobilizer - Right: On when out of bed or walking;Discontinue once straight leg raise with < 10 degree lag Restrictions Weight Bearing Restrictions: No RLE Weight Bearing: Weight bearing as tolerated       ADL  Upper Body Bathing: Simulated;Set up Where Assessed - Upper Body Bathing: Unsupported sitting Lower Body Bathing: Simulated;Moderate assistance Where Assessed - Lower Body Bathing: Supported sit to stand Upper Body Dressing: Simulated;Set up Where Assessed - Upper Body Dressing: Unsupported sitting Lower Body Dressing: Simulated;Moderate assistance Where Assessed - Lower Body Dressing: Supported sit to stand Toilet Transfer: Performed;Min Pension scheme manager Method: Sit to Barista: Materials engineer and Hygiene: Min guard;Performed Where Assessed - Engineer, mining and Hygiene: Standing ADL Comments: OT discussed tub transfer with pt and family. Pt would benefit from a tub bench. Pt and family agree and will obtain in [place].    OT Diagnosis: Generalized weakness;Acute pain  OT Problem List: Decreased strength;Pain OT Treatment Interventions:  Self-care/ADL training;DME and/or AE instruction;Patient/family education   OT Goals Acute Rehab OT Goals OT Goal Formulation: With patient Time For Goal Achievement: 06/01/12 Potential to Achieve Goals: Good ADL Goals Pt Will Perform Grooming: with supervision;Standing at sink ADL Goal: Grooming - Progress: Goal set today Pt Will Perform Lower Body Dressing: with supervision;Sit to stand from chair;with adaptive equipment ADL Goal: Lower Body Dressing - Progress: Goal set today Pt Will Transfer to Toilet: with supervision;Comfort height toilet ADL Goal: Toilet Transfer - Progress: Goal set today  Visit Information  Last OT Received On: 05/25/12 Assistance Needed: +1    Subjective Data  Subjective: I have good help at home   Prior Functioning     Home Living Lives With: Daughter Available Help at Discharge: Family Type of Home: Apartment Home Access: Level entry Home Layout: One level Bathroom Shower/Tub: Engineer, manufacturing systems: Handicapped height Home Adaptive Equipment: Walker - standard;Shower chair with back;Bedside commode/3-in-1 Prior Function Level of Independence: Independent Able to Take Stairs?: Yes Communication Communication: No difficulties         Vision/Perception Vision - History Patient Visual Report: No change from baseline   Cognition  Cognition Overall Cognitive Status: Appears within functional limits for tasks assessed/performed Arousal/Alertness: Awake/alert Orientation Level: Appears intact for tasks assessed Behavior During Session: [facility] for tasks performed    Extremity/Trunk Assessment Right Upper Extremity Assessment RUE ROM/Strength/Tone: Within functional levels Left Upper Extremity Assessment LUE Sensation: WFL - Light Touch     Mobility Bed Mobility Bed Mobility: Supine to Sit;Sit to Supine Supine to Sit: 4: Min assist;HOB elevated;With rails Sit to Supine: 4: Min assist;HOB elevated;With rail Details for Bed  Mobility Assistance: Assist for R LE off bed.  Transfers Sit to  Stand: 4: Min assist;From bed Stand to Sit: 4: Min guard;To bed Details for Transfer Assistance: cues for LE management and use of UEs to self assist. Assist to rise, stabilize.            End of Session OT - End of Session Equipment Utilized During Treatment: Right knee immobilizer Activity Tolerance: Patient tolerated treatment well Patient left: in bed;with family/visitor present;with call bell/phone within reach CPM Right Knee CPM Right Knee: Off  GO     Alba Cory 05/25/2012, 11:48 AM

## 2012-05-26 ENCOUNTER — Encounter (HOSPITAL_COMMUNITY): Payer: Self-pay | Admitting: Orthopaedic Surgery

## 2012-05-26 DIAGNOSIS — D510 Vitamin B12 deficiency anemia due to intrinsic factor deficiency: Secondary | ICD-10-CM | POA: Diagnosis not present

## 2012-05-26 LAB — CBC
HCT: 24.7 % — ABNORMAL LOW (ref 36.0–46.0)
MCH: 29.3 pg (ref 26.0–34.0)
MCV: 90.5 fL (ref 78.0–100.0)
Platelets: 184 10*3/uL (ref 150–400)
RDW: 14 % (ref 11.5–15.5)
WBC: 8.1 10*3/uL (ref 4.0–10.5)

## 2012-05-26 MED ORDER — ASPIRIN EC 325 MG PO TBEC: 325.0000 mg | DELAYED_RELEASE_TABLET | Freq: Two times a day (BID) | ORAL | Status: DC

## 2012-05-26 NOTE — Progress Notes (Signed)
Subjective: 3 Days Post-Op Procedure(s) (LRB): TOTAL KNEE ARTHROPLASTY (Right) Patient reports pain as moderate.  Acute blood loss anemia, but tolerating well.  Objective: Vital signs in last 24 hours: Temp:  [97.5 F (36.4 C)-99 F (37.2 C)] 99 F (37.2 C) (02/24 0516) Pulse Rate:  [76-91] 76 (02/24 0516) Resp:  [16-20] 18 (02/24 0516) BP: (92-116)/(55-67) 92/55 mmHg (02/24 0516) SpO2:  [94 %-98 %] 97 % (02/24 0516)  Intake/Output from previous day: 02/23 0701 - 02/24 0700 In: 1400 [P.O.:1400] Out: 300 [Urine:300] Intake/Output this shift:     Recent Labs  05/24/12 0433 05/25/12 0446 05/26/12 0422  HGB 9.5* 8.9* 8.0*    Recent Labs  05/25/12 0446 05/26/12 0422  WBC 7.8 8.1  RBC 3.07* 2.73*  HCT 27.5* 24.7*  PLT 227 184    Recent Labs  05/24/12 0433  NA 136  K 3.8  CL 103  CO2 28  BUN 6  CREATININE 0.49*  GLUCOSE 135*  CALCIUM 8.2*   No results found for this basename: LABPT, INR,  in the last 72 hours  Neurovascular intact Sensation intact distally Intact pulses distally Dorsiflexion/Plantar flexion intact Incision: scant drainage No cellulitis present Compartment soft  Assessment/Plan: 3 Days Post-Op Procedure(s) (LRB): TOTAL KNEE ARTHROPLASTY (Right) Discharge home with home health  Margaret Cowan 05/26/2012, 7:07 AM

## 2012-05-26 NOTE — Discharge Summary (Signed)
Patient ID: Margaret Cowan MRN: [id number] DOB/AGE: June 30, 1950 62 y.o.  Admit date: 05/23/2012 Discharge date: 05/26/2012  Admission Diagnoses:  Principal Problem:   Arthritis of knee, right   Discharge Diagnoses:  Same  Past Medical History  Diagnosis Date  . Chest pain   . Colonic adenoma   . Abdominal wall pain   . Hepatic cyst   . Hepatic hemangioma   . Chronic low back pain   . Anxiety   . CHF (congestive heart failure)   . Hemorrhoids, internal   . Chronic pain   . IBS (irritable bowel syndrome)   . Constipation   . GERD (gastroesophageal reflux disease)   . Schizoaffective disorder, unspecified condition   . Hypothyroidism   . Headache   . Fibromyalgia     neuropathy , cervical disc at c3-5   . Cancer     hx of cancer with hysterectomy , mole removed from left knee   . Arthritis   . Anemia   . History of blood transfusion 9/13  . Sleep apnea     cpap x 10 yrs  . Hypertension     neg stress test 11/12    Surgeries: Procedure(s): TOTAL KNEE ARTHROPLASTY on 05/23/2012   Consultants:    Discharged Condition: Improved  Hospital Course: Margaret Cowan is an 62 y.o. female who was admitted 05/23/2012 for operative treatment ofArthritis of knee, right. Patient has severe unremitting pain that affects sleep, daily activities, and work/hobbies. After pre-op clearance the patient was taken to the operating room on 05/23/2012 and underwent  Procedure(s): TOTAL KNEE ARTHROPLASTY.    Patient was given perioperative antibiotics: Anti-infectives   Start     Dose/Rate Route Frequency Ordered Stop   05/23/12 1500  doxycycline (VIBRA-TABS) tablet 100 mg     100 mg Oral 2 times daily 05/23/12 1351     05/23/12 1500  ceFAZolin (ANCEF) IVPB 1 g/50 mL premix     1 g 100 mL/hr over 30 Minutes Intravenous Every 6 hours 05/23/12 1351 05/23/12 2207   05/23/12 0705  vancomycin (VANCOCIN) IVPB 1000 mg/200 mL premix    Comments:  PCR screen tested positive for MRSA.  Give this  instead of Ancef pre-op.   1,000 mg 200 mL/hr over 60 Minutes Intravenous On call to O.R. 05/23/12 0705 05/23/12 1009   05/23/12 0705  ceFAZolin (ANCEF) IVPB 2 g/50 mL premix     2 g 100 mL/hr over 30 Minutes Intravenous On call to O.R. 05/23/12 4098 05/23/12 0954       Patient was given sequential compression devices, early ambulation, and chemoprophylaxis to prevent DVT.  Patient benefited maximally from hospital stay and there were no complications.    Recent vital signs: Patient Vitals for the past 24 hrs:  BP Temp Temp src Pulse Resp SpO2  05/26/12 0516 92/55 mmHg 99 F (37.2 C) Oral 76 18 97 %  05/26/12 0400 - - - - 18 97 %  05/26/12 0000 - - - - 16 94 %  05/25/12 2058 101/59 mmHg 97.5 F (36.4 C) Oral 79 18 94 %  05/25/12 2000 - - - - 19 97 %  05/25/12 1600 - - - - 19 97 %  05/25/12 1342 116/67 mmHg 98.9 F (37.2 C) Oral 91 19 97 %  05/25/12 1200 - - - - 19 97 %  05/25/12 0800 - - - - 20 98 %     Recent laboratory studies:  Recent Labs  05/24/12 0433 05/25/12 0446  05/26/12 0422  WBC 7.5 7.8 8.1  HGB 9.5* 8.9* 8.0*  HCT 29.1* 27.5* 24.7*  PLT 218 227 184  NA 136  --   --   K 3.8  --   --   CL 103  --   --   CO2 28  --   --   BUN 6  --   --   CREATININE 0.49*  --   --   GLUCOSE 135*  --   --   CALCIUM 8.2*  --   --      Discharge Medications:     Medication List    TAKE these medications       ALIGN 4 MG Caps  Take 1 capsule by mouth daily.     alprazolam 2 MG tablet  Commonly known as:  XANAX  Take 2 mg by mouth 2 (two) times daily as needed for sleep.     amLODipine 5 MG tablet  Commonly known as:  NORVASC  Take 5 mg by mouth 2 (two) times daily.     aspirin EC 325 MG tablet  Take 1 tablet (325 mg total) by mouth 2 (two) times daily.     colesevelam 625 MG tablet  Commonly known as:  WELCHOL  Take 1,875 mg by mouth 2 (two) times daily with a meal.     CYANOCOBALAMIN IJ  Inject 1,000 mg as directed every 30 (thirty) days.      dicyclomine 10 MG capsule  Commonly known as:  BENTYL  Take 10 mg by mouth 3 (three) times daily as needed (for stomach).     docusate sodium 100 MG capsule  Commonly known as:  COLACE  Take 200 mg by mouth at bedtime.     doxepin 25 MG capsule  Commonly known as:  SINEQUAN  Take 75 mg by mouth at bedtime.     doxycycline 100 MG tablet  Commonly known as:  ADOXA  Take 100 mg by mouth 2 (two) times daily.     escitalopram 10 MG tablet  Commonly known as:  LEXAPRO  Take 10 mg by mouth daily with breakfast.     esomeprazole 40 MG capsule  Commonly known as:  NEXIUM  Take 40 mg by mouth daily.     ferrous sulfate 325 (65 FE) MG tablet  Take 1 tablet (325 mg total) by mouth 3 (three) times daily after meals.     Fish Oil 1000 MG Caps  Take 1 capsule by mouth 3 (three) times daily.     HYDROmorphone 4 MG tablet  Commonly known as:  DILAUDID  Take 1 tablet (4 mg total) by mouth every 4 (four) hours as needed. Pain     levothyroxine 25 MCG tablet  Commonly known as:  SYNTHROID, LEVOTHROID  Take 25 mcg by mouth daily before breakfast.     lisinopril 40 MG tablet  Commonly known as:  PRINIVIL,ZESTRIL  Take 40 mg by mouth daily before lunch.     polyethylene glycol packet  Commonly known as:  MIRALAX / GLYCOLAX  Take 17 g by mouth daily as needed. constipation     promethazine 25 MG tablet  Commonly known as:  PHENERGAN  Take 25 mg by mouth daily as needed. Nausea     psyllium 0.52 G capsule  Commonly known as:  REGULOID  Take 0.52 g by mouth daily. Pt takes 5 capsules daily at 1700pm     REFRESH TEARS 0.5 % Soln  Generic drug:  carboxymethylcellulose  Place 1 drop into both eyes 3 (three) times daily as needed (for dry eyes).        Diagnostic Studies: Dg Knee Right Port  05/23/2012  *RADIOLOGY REPORT*  Clinical Data: Postop right total knee.  PORTABLE RIGHT KNEE - 1-2 VIEW  Comparison: None.  Findings: Changes right knee replacement.  Soft tissue drain in place.   Soft tissue and joint space gas.  No hardware or bony complicating feature.  IMPRESSION: Right knee replacement.  No complicating feature.   Original Report Authenticated By: Charlett Nose, M.D.     Disposition: 06-Home-Health Care Svc       Future Appointments Provider Department Dept Phone   07/28/2012 3:00 PM [facility] [place] [facility] [phone]   Patient should wear two piece clothing and wear no powder or deodorant. Patient should arrive 15 minutes early.      Follow-up Information   Follow up with Kathryne Hitch, MD In 2 weeks.   Contact information:   [address] [place] [address] [phone]        Signed: Kathryne Hitch 05/26/2012, 7:10 AM

## 2012-05-26 NOTE — Progress Notes (Signed)
Physical Therapy Treatment Patient Details Name: Margaret Cowan MRN: [id number] DOB: 1950-07-01 Today's Date: 05/26/2012 Time: 4098-1191 PT Time Calculation (min): 23 min  PT Assessment / Plan / Recommendation Comments on Treatment Session  completed all education. Issued ROM exercise handout. Instructed pt to perform exercises one more time this evening at home. Recommend hHPt.     Follow Up Recommendations  Home health PT     Does the patient have the potential to tolerate intense rehabilitation     Barriers to Discharge        Equipment Recommendations  None recommended by PT    Recommendations for Other Services    Frequency 7X/week   Plan Discharge plan remains appropriate    Precautions / Restrictions Precautions Precautions: Knee;Fall Required Braces or Orthoses: Knee Immobilizer - Right Knee Immobilizer - Right: On when out of bed or walking;Discontinue once straight leg raise with < 10 degree lag Restrictions Weight Bearing Restrictions: No RLE Weight Bearing: Weight bearing as tolerated   Pertinent Vitals/Pain 4/10 R knee    Mobility  Bed Mobility Bed Mobility: Supine to Sit;Sit to Supine Supine to Sit: 4: Min assist Sit to Supine: 4: Min assist Details for Bed Mobility Assistance: Assist for R LE off/onto bed.  Transfers Transfers: Sit to Stand;Stand to Sit Sit to Stand: 4: Min guard;From bed Stand to Sit: 4: Min guard;To bed Details for Transfer Assistance: cues for LE management and use of UEs to self assist.  Ambulation/Gait Ambulation/Gait Assistance: 4: Min guard Ambulation Distance (Feet): 90 Feet Assistive device: Rolling walker Gait Pattern: Step-to pattern;Antalgic;Decreased stride length;Decreased step length - left;Decreased step length - right;Trunk flexed    Exercises Total Joint Exercises Ankle Circles/Pumps: AROM;Both;10 reps;Supine Quad Sets: AROM;Both;10 reps;Supine Short Arc Quad: AROM;Right;10 reps;Supine Heel Slides: AAROM;Right;10  reps;Supine Hip ABduction/ADduction: AAROM;Right;10 reps;Supine Straight Leg Raises: AAROM;Right;10 reps;Supine   PT Diagnosis:    PT Problem List:   PT Treatment Interventions:     PT Goals Acute Rehab PT Goals Pt will go Supine/Side to Sit: with supervision PT Goal: Supine/Side to Sit - Progress: Progressing toward goal Pt will go Sit to Supine/Side: with supervision PT Goal: Sit to Supine/Side - Progress: Progressing toward goal Pt will go Sit to Stand: with supervision PT Goal: Sit to Stand - Progress: Progressing toward goal Pt will go Stand to Sit: with supervision PT Goal: Stand to Sit - Progress: Progressing toward goal Pt will Ambulate: 51 - 150 feet;with supervision;with rolling walker PT Goal: Ambulate - Progress: Progressing toward goal  Visit Information  Last PT Received On: 05/26/12 Assistance Needed: +1    Subjective Data  Subjective: Im ready to go home Patient Stated Goal: Resume previous lifestyle with decreased pain   Cognition  Cognition Overall Cognitive Status: Appears within functional limits for tasks assessed/performed Arousal/Alertness: Awake/alert Orientation Level: Appears intact for tasks assessed Behavior During Session: [facility] for tasks performed    Balance     End of Session PT - End of Session Equipment Utilized During Treatment: Right knee immobilizer Activity Tolerance: Patient tolerated treatment well Patient left: in bed;with call bell/phone within reach CPM Right Knee CPM Right Knee: Off   GP     Rebeca [facility] 05/26/2012, 12:12 PM [phone]

## 2012-05-26 NOTE — Care Management Note (Signed)
    Page 1 of 2   05/26/2012     2:41:18 PM   CARE MANAGEMENT NOTE 05/26/2012  Patient:  Margaret Cowan, Margaret Cowan   Account Number:  [id number]  Date Initiated:  05/26/2012  Documentation initiated by:  Colleen Can  Subjective/Objective Assessment:   dx severe osteoarthritis roight knee; total knee replacemnt     Action/Plan:   CM spoke with pt's daughter. Plans are for patient to return to her home in [place] [facility] where daughter will be caregiver. Pt already has RW. apt has used [facility] for [facility] services and wishes to use again.   Anticipated DC Date:  05/26/2012   Anticipated DC Plan:  HOME W HOME HEALTH SERVICES  In-house referral  NA      DC Planning Services  CM consult      [facility] Choice  HOME HEALTH   Choice offered to / List presented to:  C-1 Patient   DME arranged  NA      DME agency  NA     HH arranged  HH-2 PT      HH agency  [facility]   Status of service:  Completed, signed off Medicare Important Message given?  NA - LOS <3 / Initial given by admissions (If response is "NO", the following Medicare IM given date fields will be blank) Date Medicare IM given:   Date Additional Medicare IM given:    Discharge Disposition:  HOME W HOME HEALTH SERVICES  Per UR Regulation:    If discussed at Long Length of Stay Meetings, dates discussed:    Comments:  05/26/2012 Colleen Can BSN RN CCM [phone] CM spoke with [facility] rep who advised that they could service patient with [facility] services with start date 05/27/2012.

## 2012-06-05 DIAGNOSIS — M25519 Pain in unspecified shoulder: Secondary | ICD-10-CM | POA: Diagnosis not present

## 2012-06-23 DIAGNOSIS — I509 Heart failure, unspecified: Secondary | ICD-10-CM | POA: Diagnosis not present

## 2012-06-23 DIAGNOSIS — D510 Vitamin B12 deficiency anemia due to intrinsic factor deficiency: Secondary | ICD-10-CM | POA: Diagnosis not present

## 2012-06-23 DIAGNOSIS — IMO0001 Reserved for inherently not codable concepts without codable children: Secondary | ICD-10-CM | POA: Diagnosis not present

## 2012-06-23 DIAGNOSIS — F259 Schizoaffective disorder, unspecified: Secondary | ICD-10-CM | POA: Diagnosis not present

## 2012-06-23 DIAGNOSIS — G609 Hereditary and idiopathic neuropathy, unspecified: Secondary | ICD-10-CM | POA: Diagnosis not present

## 2012-06-23 DIAGNOSIS — I10 Essential (primary) hypertension: Secondary | ICD-10-CM | POA: Diagnosis not present

## 2012-06-25 DIAGNOSIS — IMO0001 Reserved for inherently not codable concepts without codable children: Secondary | ICD-10-CM | POA: Diagnosis not present

## 2012-06-25 DIAGNOSIS — G609 Hereditary and idiopathic neuropathy, unspecified: Secondary | ICD-10-CM | POA: Diagnosis not present

## 2012-06-25 DIAGNOSIS — I10 Essential (primary) hypertension: Secondary | ICD-10-CM | POA: Diagnosis not present

## 2012-06-25 DIAGNOSIS — I509 Heart failure, unspecified: Secondary | ICD-10-CM | POA: Diagnosis not present

## 2012-06-25 DIAGNOSIS — F259 Schizoaffective disorder, unspecified: Secondary | ICD-10-CM | POA: Diagnosis not present

## 2012-06-25 DIAGNOSIS — D510 Vitamin B12 deficiency anemia due to intrinsic factor deficiency: Secondary | ICD-10-CM | POA: Diagnosis not present

## 2012-06-27 DIAGNOSIS — F259 Schizoaffective disorder, unspecified: Secondary | ICD-10-CM | POA: Diagnosis not present

## 2012-06-27 DIAGNOSIS — IMO0001 Reserved for inherently not codable concepts without codable children: Secondary | ICD-10-CM | POA: Diagnosis not present

## 2012-06-27 DIAGNOSIS — G609 Hereditary and idiopathic neuropathy, unspecified: Secondary | ICD-10-CM | POA: Diagnosis not present

## 2012-06-27 DIAGNOSIS — I509 Heart failure, unspecified: Secondary | ICD-10-CM | POA: Diagnosis not present

## 2012-06-27 DIAGNOSIS — D510 Vitamin B12 deficiency anemia due to intrinsic factor deficiency: Secondary | ICD-10-CM | POA: Diagnosis not present

## 2012-06-27 DIAGNOSIS — I10 Essential (primary) hypertension: Secondary | ICD-10-CM | POA: Diagnosis not present

## 2012-07-01 DIAGNOSIS — G609 Hereditary and idiopathic neuropathy, unspecified: Secondary | ICD-10-CM | POA: Diagnosis not present

## 2012-07-01 DIAGNOSIS — D510 Vitamin B12 deficiency anemia due to intrinsic factor deficiency: Secondary | ICD-10-CM | POA: Diagnosis not present

## 2012-07-01 DIAGNOSIS — F259 Schizoaffective disorder, unspecified: Secondary | ICD-10-CM | POA: Diagnosis not present

## 2012-07-01 DIAGNOSIS — I509 Heart failure, unspecified: Secondary | ICD-10-CM | POA: Diagnosis not present

## 2012-07-01 DIAGNOSIS — IMO0001 Reserved for inherently not codable concepts without codable children: Secondary | ICD-10-CM | POA: Diagnosis not present

## 2012-07-01 DIAGNOSIS — I10 Essential (primary) hypertension: Secondary | ICD-10-CM | POA: Diagnosis not present

## 2012-07-03 DIAGNOSIS — D485 Neoplasm of uncertain behavior of skin: Secondary | ICD-10-CM | POA: Diagnosis not present

## 2012-07-04 DIAGNOSIS — IMO0001 Reserved for inherently not codable concepts without codable children: Secondary | ICD-10-CM | POA: Diagnosis not present

## 2012-07-04 DIAGNOSIS — D510 Vitamin B12 deficiency anemia due to intrinsic factor deficiency: Secondary | ICD-10-CM | POA: Diagnosis not present

## 2012-07-04 DIAGNOSIS — F259 Schizoaffective disorder, unspecified: Secondary | ICD-10-CM | POA: Diagnosis not present

## 2012-07-04 DIAGNOSIS — G609 Hereditary and idiopathic neuropathy, unspecified: Secondary | ICD-10-CM | POA: Diagnosis not present

## 2012-07-04 DIAGNOSIS — I509 Heart failure, unspecified: Secondary | ICD-10-CM | POA: Diagnosis not present

## 2012-07-04 DIAGNOSIS — I10 Essential (primary) hypertension: Secondary | ICD-10-CM | POA: Diagnosis not present

## 2012-07-08 DIAGNOSIS — B351 Tinea unguium: Secondary | ICD-10-CM | POA: Diagnosis not present

## 2012-07-08 DIAGNOSIS — IMO0001 Reserved for inherently not codable concepts without codable children: Secondary | ICD-10-CM | POA: Diagnosis not present

## 2012-07-08 DIAGNOSIS — D510 Vitamin B12 deficiency anemia due to intrinsic factor deficiency: Secondary | ICD-10-CM | POA: Diagnosis not present

## 2012-07-08 DIAGNOSIS — F259 Schizoaffective disorder, unspecified: Secondary | ICD-10-CM | POA: Diagnosis not present

## 2012-07-08 DIAGNOSIS — I509 Heart failure, unspecified: Secondary | ICD-10-CM | POA: Diagnosis not present

## 2012-07-08 DIAGNOSIS — M79609 Pain in unspecified limb: Secondary | ICD-10-CM | POA: Diagnosis not present

## 2012-07-08 DIAGNOSIS — L600 Ingrowing nail: Secondary | ICD-10-CM | POA: Diagnosis not present

## 2012-07-08 DIAGNOSIS — G609 Hereditary and idiopathic neuropathy, unspecified: Secondary | ICD-10-CM | POA: Diagnosis not present

## 2012-07-08 DIAGNOSIS — I10 Essential (primary) hypertension: Secondary | ICD-10-CM | POA: Diagnosis not present

## 2012-07-10 DIAGNOSIS — D510 Vitamin B12 deficiency anemia due to intrinsic factor deficiency: Secondary | ICD-10-CM | POA: Diagnosis not present

## 2012-07-10 DIAGNOSIS — IMO0001 Reserved for inherently not codable concepts without codable children: Secondary | ICD-10-CM | POA: Diagnosis not present

## 2012-07-10 DIAGNOSIS — I10 Essential (primary) hypertension: Secondary | ICD-10-CM | POA: Diagnosis not present

## 2012-07-10 DIAGNOSIS — I509 Heart failure, unspecified: Secondary | ICD-10-CM | POA: Diagnosis not present

## 2012-07-10 DIAGNOSIS — G609 Hereditary and idiopathic neuropathy, unspecified: Secondary | ICD-10-CM | POA: Diagnosis not present

## 2012-07-10 DIAGNOSIS — F259 Schizoaffective disorder, unspecified: Secondary | ICD-10-CM | POA: Diagnosis not present

## 2012-07-11 DIAGNOSIS — M25569 Pain in unspecified knee: Secondary | ICD-10-CM | POA: Diagnosis not present

## 2012-07-11 DIAGNOSIS — M169 Osteoarthritis of hip, unspecified: Secondary | ICD-10-CM | POA: Diagnosis not present

## 2012-07-11 DIAGNOSIS — G894 Chronic pain syndrome: Secondary | ICD-10-CM | POA: Diagnosis not present

## 2012-07-11 DIAGNOSIS — M545 Low back pain: Secondary | ICD-10-CM | POA: Diagnosis not present

## 2012-07-15 DIAGNOSIS — IMO0001 Reserved for inherently not codable concepts without codable children: Secondary | ICD-10-CM | POA: Diagnosis not present

## 2012-07-15 DIAGNOSIS — D510 Vitamin B12 deficiency anemia due to intrinsic factor deficiency: Secondary | ICD-10-CM | POA: Diagnosis not present

## 2012-07-15 DIAGNOSIS — I10 Essential (primary) hypertension: Secondary | ICD-10-CM | POA: Diagnosis not present

## 2012-07-15 DIAGNOSIS — I509 Heart failure, unspecified: Secondary | ICD-10-CM | POA: Diagnosis not present

## 2012-07-15 DIAGNOSIS — G609 Hereditary and idiopathic neuropathy, unspecified: Secondary | ICD-10-CM | POA: Diagnosis not present

## 2012-07-15 DIAGNOSIS — F259 Schizoaffective disorder, unspecified: Secondary | ICD-10-CM | POA: Diagnosis not present

## 2012-07-17 DIAGNOSIS — D510 Vitamin B12 deficiency anemia due to intrinsic factor deficiency: Secondary | ICD-10-CM | POA: Diagnosis not present

## 2012-07-17 DIAGNOSIS — I509 Heart failure, unspecified: Secondary | ICD-10-CM | POA: Diagnosis not present

## 2012-07-17 DIAGNOSIS — IMO0001 Reserved for inherently not codable concepts without codable children: Secondary | ICD-10-CM | POA: Diagnosis not present

## 2012-07-17 DIAGNOSIS — G609 Hereditary and idiopathic neuropathy, unspecified: Secondary | ICD-10-CM | POA: Diagnosis not present

## 2012-07-17 DIAGNOSIS — I10 Essential (primary) hypertension: Secondary | ICD-10-CM | POA: Diagnosis not present

## 2012-07-17 DIAGNOSIS — F259 Schizoaffective disorder, unspecified: Secondary | ICD-10-CM | POA: Diagnosis not present

## 2012-07-22 DIAGNOSIS — I509 Heart failure, unspecified: Secondary | ICD-10-CM | POA: Diagnosis not present

## 2012-07-22 DIAGNOSIS — I10 Essential (primary) hypertension: Secondary | ICD-10-CM | POA: Diagnosis not present

## 2012-07-22 DIAGNOSIS — F259 Schizoaffective disorder, unspecified: Secondary | ICD-10-CM | POA: Diagnosis not present

## 2012-07-22 DIAGNOSIS — IMO0001 Reserved for inherently not codable concepts without codable children: Secondary | ICD-10-CM | POA: Diagnosis not present

## 2012-07-22 DIAGNOSIS — G609 Hereditary and idiopathic neuropathy, unspecified: Secondary | ICD-10-CM | POA: Diagnosis not present

## 2012-07-22 DIAGNOSIS — D510 Vitamin B12 deficiency anemia due to intrinsic factor deficiency: Secondary | ICD-10-CM | POA: Diagnosis not present

## 2012-07-23 DIAGNOSIS — IMO0001 Reserved for inherently not codable concepts without codable children: Secondary | ICD-10-CM | POA: Diagnosis not present

## 2012-07-23 DIAGNOSIS — D510 Vitamin B12 deficiency anemia due to intrinsic factor deficiency: Secondary | ICD-10-CM | POA: Diagnosis not present

## 2012-07-23 DIAGNOSIS — F259 Schizoaffective disorder, unspecified: Secondary | ICD-10-CM | POA: Diagnosis not present

## 2012-07-23 DIAGNOSIS — I10 Essential (primary) hypertension: Secondary | ICD-10-CM | POA: Diagnosis not present

## 2012-07-23 DIAGNOSIS — I509 Heart failure, unspecified: Secondary | ICD-10-CM | POA: Diagnosis not present

## 2012-07-23 DIAGNOSIS — G609 Hereditary and idiopathic neuropathy, unspecified: Secondary | ICD-10-CM | POA: Diagnosis not present

## 2012-07-24 DIAGNOSIS — F259 Schizoaffective disorder, unspecified: Secondary | ICD-10-CM | POA: Diagnosis not present

## 2012-07-24 DIAGNOSIS — I509 Heart failure, unspecified: Secondary | ICD-10-CM | POA: Diagnosis not present

## 2012-07-24 DIAGNOSIS — IMO0001 Reserved for inherently not codable concepts without codable children: Secondary | ICD-10-CM | POA: Diagnosis not present

## 2012-07-24 DIAGNOSIS — G609 Hereditary and idiopathic neuropathy, unspecified: Secondary | ICD-10-CM | POA: Diagnosis not present

## 2012-07-24 DIAGNOSIS — D510 Vitamin B12 deficiency anemia due to intrinsic factor deficiency: Secondary | ICD-10-CM | POA: Diagnosis not present

## 2012-07-24 DIAGNOSIS — I10 Essential (primary) hypertension: Secondary | ICD-10-CM | POA: Diagnosis not present

## 2012-07-25 DIAGNOSIS — D510 Vitamin B12 deficiency anemia due to intrinsic factor deficiency: Secondary | ICD-10-CM | POA: Diagnosis not present

## 2012-07-25 DIAGNOSIS — F259 Schizoaffective disorder, unspecified: Secondary | ICD-10-CM | POA: Diagnosis not present

## 2012-07-25 DIAGNOSIS — E669 Obesity, unspecified: Secondary | ICD-10-CM | POA: Diagnosis not present

## 2012-07-25 DIAGNOSIS — G609 Hereditary and idiopathic neuropathy, unspecified: Secondary | ICD-10-CM | POA: Diagnosis not present

## 2012-07-25 DIAGNOSIS — I10 Essential (primary) hypertension: Secondary | ICD-10-CM | POA: Diagnosis not present

## 2012-07-25 DIAGNOSIS — Z602 Problems related to living alone: Secondary | ICD-10-CM | POA: Diagnosis not present

## 2012-07-25 DIAGNOSIS — IMO0001 Reserved for inherently not codable concepts without codable children: Secondary | ICD-10-CM | POA: Diagnosis not present

## 2012-07-25 DIAGNOSIS — Z96659 Presence of unspecified artificial knee joint: Secondary | ICD-10-CM | POA: Diagnosis not present

## 2012-07-25 DIAGNOSIS — I509 Heart failure, unspecified: Secondary | ICD-10-CM | POA: Diagnosis not present

## 2012-07-28 ENCOUNTER — Ambulatory Visit (HOSPITAL_COMMUNITY)
Admission: RE | Admit: 2012-07-28 | Discharge: 2012-07-28 | Disposition: A | Discharge status: Home / Self Care | Payer: Medicare Other | Source: Ambulatory Visit | Attending: Internal Medicine | Admitting: Internal Medicine

## 2012-07-28 DIAGNOSIS — Z139 Encounter for screening, unspecified: Secondary | ICD-10-CM

## 2012-07-28 DIAGNOSIS — Z1231 Encounter for screening mammogram for malignant neoplasm of breast: Secondary | ICD-10-CM | POA: Diagnosis not present

## 2012-08-12 DIAGNOSIS — I10 Essential (primary) hypertension: Secondary | ICD-10-CM | POA: Diagnosis not present

## 2012-08-12 DIAGNOSIS — Z6836 Body mass index (BMI) 36.0-36.9, adult: Secondary | ICD-10-CM | POA: Diagnosis not present

## 2012-08-12 DIAGNOSIS — Z0000 Encounter for general adult medical examination without abnormal findings: Secondary | ICD-10-CM | POA: Diagnosis not present

## 2012-08-12 DIAGNOSIS — F411 Generalized anxiety disorder: Secondary | ICD-10-CM | POA: Diagnosis not present

## 2012-08-12 DIAGNOSIS — F329 Major depressive disorder, single episode, unspecified: Secondary | ICD-10-CM | POA: Diagnosis not present

## 2012-08-12 DIAGNOSIS — Z79899 Other long term (current) drug therapy: Secondary | ICD-10-CM | POA: Diagnosis not present

## 2012-08-13 ENCOUNTER — Other Ambulatory Visit (HOSPITAL_COMMUNITY): Payer: Self-pay | Admitting: Internal Medicine

## 2012-08-13 DIAGNOSIS — R1013 Epigastric pain: Secondary | ICD-10-CM

## 2012-08-13 DIAGNOSIS — K769 Liver disease, unspecified: Secondary | ICD-10-CM

## 2012-08-18 ENCOUNTER — Ambulatory Visit (HOSPITAL_COMMUNITY): Payer: Medicare Other

## 2012-08-18 ENCOUNTER — Ambulatory Visit (HOSPITAL_COMMUNITY)
Admission: RE | Admit: 2012-08-18 | Discharge: 2012-08-18 | Disposition: A | Discharge status: Home / Self Care | Payer: Medicare Other | Source: Ambulatory Visit | Attending: Internal Medicine | Admitting: Internal Medicine

## 2012-08-18 DIAGNOSIS — K7689 Other specified diseases of liver: Secondary | ICD-10-CM | POA: Insufficient documentation

## 2012-08-18 DIAGNOSIS — Q619 Cystic kidney disease, unspecified: Secondary | ICD-10-CM | POA: Diagnosis not present

## 2012-08-18 DIAGNOSIS — R1013 Epigastric pain: Secondary | ICD-10-CM

## 2012-08-18 DIAGNOSIS — R109 Unspecified abdominal pain: Secondary | ICD-10-CM | POA: Insufficient documentation

## 2012-08-18 DIAGNOSIS — K769 Liver disease, unspecified: Secondary | ICD-10-CM

## 2012-08-21 DIAGNOSIS — I10 Essential (primary) hypertension: Secondary | ICD-10-CM | POA: Diagnosis not present

## 2012-08-21 DIAGNOSIS — I509 Heart failure, unspecified: Secondary | ICD-10-CM | POA: Diagnosis not present

## 2012-08-21 DIAGNOSIS — IMO0001 Reserved for inherently not codable concepts without codable children: Secondary | ICD-10-CM | POA: Diagnosis not present

## 2012-08-21 DIAGNOSIS — F259 Schizoaffective disorder, unspecified: Secondary | ICD-10-CM | POA: Diagnosis not present

## 2012-08-21 DIAGNOSIS — D510 Vitamin B12 deficiency anemia due to intrinsic factor deficiency: Secondary | ICD-10-CM | POA: Diagnosis not present

## 2012-08-21 DIAGNOSIS — G609 Hereditary and idiopathic neuropathy, unspecified: Secondary | ICD-10-CM | POA: Diagnosis not present

## 2012-08-26 ENCOUNTER — Encounter: Payer: Self-pay | Admitting: General Practice

## 2012-09-17 DIAGNOSIS — Z6835 Body mass index (BMI) 35.0-35.9, adult: Secondary | ICD-10-CM | POA: Diagnosis not present

## 2012-09-17 DIAGNOSIS — G4733 Obstructive sleep apnea (adult) (pediatric): Secondary | ICD-10-CM | POA: Diagnosis not present

## 2012-09-17 DIAGNOSIS — M549 Dorsalgia, unspecified: Secondary | ICD-10-CM | POA: Diagnosis not present

## 2012-09-17 DIAGNOSIS — N8111 Cystocele, midline: Secondary | ICD-10-CM | POA: Diagnosis not present

## 2012-09-19 DIAGNOSIS — R3911 Hesitancy of micturition: Secondary | ICD-10-CM | POA: Diagnosis not present

## 2012-09-19 DIAGNOSIS — N393 Stress incontinence (female) (male): Secondary | ICD-10-CM | POA: Diagnosis not present

## 2012-09-19 DIAGNOSIS — R3915 Urgency of urination: Secondary | ICD-10-CM | POA: Diagnosis not present

## 2012-09-19 DIAGNOSIS — R350 Frequency of micturition: Secondary | ICD-10-CM | POA: Diagnosis not present

## 2012-09-22 DIAGNOSIS — I509 Heart failure, unspecified: Secondary | ICD-10-CM | POA: Diagnosis not present

## 2012-09-22 DIAGNOSIS — G609 Hereditary and idiopathic neuropathy, unspecified: Secondary | ICD-10-CM | POA: Diagnosis not present

## 2012-09-22 DIAGNOSIS — M23329 Other meniscus derangements, posterior horn of medial meniscus, unspecified knee: Secondary | ICD-10-CM | POA: Diagnosis not present

## 2012-09-22 DIAGNOSIS — I10 Essential (primary) hypertension: Secondary | ICD-10-CM | POA: Diagnosis not present

## 2012-09-22 DIAGNOSIS — F259 Schizoaffective disorder, unspecified: Secondary | ICD-10-CM | POA: Diagnosis not present

## 2012-09-22 DIAGNOSIS — IMO0001 Reserved for inherently not codable concepts without codable children: Secondary | ICD-10-CM | POA: Diagnosis not present

## 2012-09-22 DIAGNOSIS — M1610 Unilateral primary osteoarthritis, unspecified hip: Secondary | ICD-10-CM | POA: Diagnosis not present

## 2012-09-22 DIAGNOSIS — D510 Vitamin B12 deficiency anemia due to intrinsic factor deficiency: Secondary | ICD-10-CM | POA: Diagnosis not present

## 2012-09-23 DIAGNOSIS — E669 Obesity, unspecified: Secondary | ICD-10-CM | POA: Diagnosis not present

## 2012-09-23 DIAGNOSIS — D510 Vitamin B12 deficiency anemia due to intrinsic factor deficiency: Secondary | ICD-10-CM | POA: Diagnosis not present

## 2012-09-23 DIAGNOSIS — G609 Hereditary and idiopathic neuropathy, unspecified: Secondary | ICD-10-CM | POA: Diagnosis not present

## 2012-09-23 DIAGNOSIS — I10 Essential (primary) hypertension: Secondary | ICD-10-CM | POA: Diagnosis not present

## 2012-09-23 DIAGNOSIS — IMO0001 Reserved for inherently not codable concepts without codable children: Secondary | ICD-10-CM | POA: Diagnosis not present

## 2012-09-23 DIAGNOSIS — F259 Schizoaffective disorder, unspecified: Secondary | ICD-10-CM | POA: Diagnosis not present

## 2012-09-23 DIAGNOSIS — Z602 Problems related to living alone: Secondary | ICD-10-CM | POA: Diagnosis not present

## 2012-09-23 DIAGNOSIS — I509 Heart failure, unspecified: Secondary | ICD-10-CM | POA: Diagnosis not present

## 2012-09-23 DIAGNOSIS — Z96659 Presence of unspecified artificial knee joint: Secondary | ICD-10-CM | POA: Diagnosis not present

## 2012-09-24 DIAGNOSIS — G894 Chronic pain syndrome: Secondary | ICD-10-CM | POA: Diagnosis not present

## 2012-09-24 DIAGNOSIS — IMO0002 Reserved for concepts with insufficient information to code with codable children: Secondary | ICD-10-CM | POA: Diagnosis not present

## 2012-09-24 DIAGNOSIS — M545 Low back pain: Secondary | ICD-10-CM | POA: Diagnosis not present

## 2012-09-24 DIAGNOSIS — M5137 Other intervertebral disc degeneration, lumbosacral region: Secondary | ICD-10-CM | POA: Diagnosis not present

## 2012-09-25 DIAGNOSIS — Z8742 Personal history of other diseases of the female genital tract: Secondary | ICD-10-CM | POA: Diagnosis not present

## 2012-09-25 DIAGNOSIS — Z90710 Acquired absence of both cervix and uterus: Secondary | ICD-10-CM | POA: Diagnosis not present

## 2012-09-25 DIAGNOSIS — Z01419 Encounter for gynecological examination (general) (routine) without abnormal findings: Secondary | ICD-10-CM | POA: Diagnosis not present

## 2012-09-25 DIAGNOSIS — Z1272 Encounter for screening for malignant neoplasm of vagina: Secondary | ICD-10-CM | POA: Diagnosis not present

## 2012-09-25 DIAGNOSIS — N951 Menopausal and female climacteric states: Secondary | ICD-10-CM | POA: Diagnosis not present

## 2012-10-08 DIAGNOSIS — R350 Frequency of micturition: Secondary | ICD-10-CM | POA: Diagnosis not present

## 2012-10-08 DIAGNOSIS — Z79899 Other long term (current) drug therapy: Secondary | ICD-10-CM | POA: Diagnosis not present

## 2012-10-08 DIAGNOSIS — R3911 Hesitancy of micturition: Secondary | ICD-10-CM | POA: Diagnosis not present

## 2012-10-08 DIAGNOSIS — M169 Osteoarthritis of hip, unspecified: Secondary | ICD-10-CM | POA: Diagnosis not present

## 2012-10-08 DIAGNOSIS — R3915 Urgency of urination: Secondary | ICD-10-CM | POA: Diagnosis not present

## 2012-10-08 DIAGNOSIS — M25569 Pain in unspecified knee: Secondary | ICD-10-CM | POA: Diagnosis not present

## 2012-10-08 DIAGNOSIS — M545 Low back pain: Secondary | ICD-10-CM | POA: Diagnosis not present

## 2012-10-08 DIAGNOSIS — G894 Chronic pain syndrome: Secondary | ICD-10-CM | POA: Diagnosis not present

## 2012-10-08 DIAGNOSIS — Z5181 Encounter for therapeutic drug level monitoring: Secondary | ICD-10-CM | POA: Diagnosis not present

## 2012-10-08 DIAGNOSIS — N393 Stress incontinence (female) (male): Secondary | ICD-10-CM | POA: Diagnosis not present

## 2012-10-14 DIAGNOSIS — M542 Cervicalgia: Secondary | ICD-10-CM | POA: Diagnosis not present

## 2012-10-14 DIAGNOSIS — M5412 Radiculopathy, cervical region: Secondary | ICD-10-CM | POA: Diagnosis not present

## 2012-10-14 DIAGNOSIS — M4802 Spinal stenosis, cervical region: Secondary | ICD-10-CM | POA: Diagnosis not present

## 2012-10-14 DIAGNOSIS — M5030 Other cervical disc degeneration, unspecified cervical region: Secondary | ICD-10-CM | POA: Diagnosis not present

## 2012-10-14 DIAGNOSIS — G894 Chronic pain syndrome: Secondary | ICD-10-CM | POA: Diagnosis not present

## 2012-10-17 DIAGNOSIS — I10 Essential (primary) hypertension: Secondary | ICD-10-CM | POA: Diagnosis not present

## 2012-10-17 DIAGNOSIS — IMO0001 Reserved for inherently not codable concepts without codable children: Secondary | ICD-10-CM | POA: Diagnosis not present

## 2012-10-17 DIAGNOSIS — D510 Vitamin B12 deficiency anemia due to intrinsic factor deficiency: Secondary | ICD-10-CM | POA: Diagnosis not present

## 2012-10-17 DIAGNOSIS — I509 Heart failure, unspecified: Secondary | ICD-10-CM | POA: Diagnosis not present

## 2012-10-17 DIAGNOSIS — F259 Schizoaffective disorder, unspecified: Secondary | ICD-10-CM | POA: Diagnosis not present

## 2012-10-17 DIAGNOSIS — G609 Hereditary and idiopathic neuropathy, unspecified: Secondary | ICD-10-CM | POA: Diagnosis not present

## 2012-10-21 DIAGNOSIS — N952 Postmenopausal atrophic vaginitis: Secondary | ICD-10-CM | POA: Diagnosis not present

## 2012-11-06 ENCOUNTER — Other Ambulatory Visit: Payer: Self-pay | Admitting: Physical Medicine and Rehabilitation

## 2012-11-06 DIAGNOSIS — G43109 Migraine with aura, not intractable, without status migrainosus: Secondary | ICD-10-CM | POA: Diagnosis not present

## 2012-11-06 DIAGNOSIS — M542 Cervicalgia: Secondary | ICD-10-CM

## 2012-11-06 DIAGNOSIS — G894 Chronic pain syndrome: Secondary | ICD-10-CM | POA: Diagnosis not present

## 2012-11-06 DIAGNOSIS — M47817 Spondylosis without myelopathy or radiculopathy, lumbosacral region: Secondary | ICD-10-CM | POA: Diagnosis not present

## 2012-11-06 DIAGNOSIS — M545 Low back pain: Secondary | ICD-10-CM | POA: Diagnosis not present

## 2012-11-06 DIAGNOSIS — M4802 Spinal stenosis, cervical region: Secondary | ICD-10-CM | POA: Diagnosis not present

## 2012-11-06 DIAGNOSIS — M5412 Radiculopathy, cervical region: Secondary | ICD-10-CM | POA: Diagnosis not present

## 2012-11-06 DIAGNOSIS — M5030 Other cervical disc degeneration, unspecified cervical region: Secondary | ICD-10-CM | POA: Diagnosis not present

## 2012-11-07 DIAGNOSIS — R3915 Urgency of urination: Secondary | ICD-10-CM | POA: Diagnosis not present

## 2012-11-07 DIAGNOSIS — N393 Stress incontinence (female) (male): Secondary | ICD-10-CM | POA: Diagnosis not present

## 2012-11-07 DIAGNOSIS — R350 Frequency of micturition: Secondary | ICD-10-CM | POA: Diagnosis not present

## 2012-11-07 DIAGNOSIS — R3911 Hesitancy of micturition: Secondary | ICD-10-CM | POA: Diagnosis not present

## 2012-11-13 ENCOUNTER — Ambulatory Visit
Admission: RE | Admit: 2012-11-13 | Discharge: 2012-11-13 | Disposition: A | Discharge status: Home / Self Care | Payer: Medicare Other | Source: Ambulatory Visit | Attending: Physical Medicine and Rehabilitation | Admitting: Physical Medicine and Rehabilitation

## 2012-11-13 DIAGNOSIS — M48061 Spinal stenosis, lumbar region without neurogenic claudication: Secondary | ICD-10-CM | POA: Diagnosis not present

## 2012-11-13 DIAGNOSIS — M542 Cervicalgia: Secondary | ICD-10-CM

## 2012-11-13 DIAGNOSIS — M47817 Spondylosis without myelopathy or radiculopathy, lumbosacral region: Secondary | ICD-10-CM | POA: Diagnosis not present

## 2012-11-13 DIAGNOSIS — M5137 Other intervertebral disc degeneration, lumbosacral region: Secondary | ICD-10-CM | POA: Diagnosis not present

## 2012-11-13 DIAGNOSIS — M4120 Other idiopathic scoliosis, site unspecified: Secondary | ICD-10-CM | POA: Diagnosis not present

## 2012-11-22 DIAGNOSIS — S62309B Unspecified fracture of unspecified metacarpal bone, initial encounter for open fracture: Secondary | ICD-10-CM | POA: Diagnosis not present

## 2012-11-22 DIAGNOSIS — F259 Schizoaffective disorder, unspecified: Secondary | ICD-10-CM | POA: Diagnosis not present

## 2012-11-22 DIAGNOSIS — E669 Obesity, unspecified: Secondary | ICD-10-CM | POA: Diagnosis not present

## 2012-11-22 DIAGNOSIS — D510 Vitamin B12 deficiency anemia due to intrinsic factor deficiency: Secondary | ICD-10-CM | POA: Diagnosis not present

## 2012-11-22 DIAGNOSIS — IMO0001 Reserved for inherently not codable concepts without codable children: Secondary | ICD-10-CM | POA: Diagnosis not present

## 2012-11-22 DIAGNOSIS — Z602 Problems related to living alone: Secondary | ICD-10-CM | POA: Diagnosis not present

## 2012-11-22 DIAGNOSIS — I10 Essential (primary) hypertension: Secondary | ICD-10-CM | POA: Diagnosis not present

## 2012-11-22 DIAGNOSIS — G609 Hereditary and idiopathic neuropathy, unspecified: Secondary | ICD-10-CM | POA: Diagnosis not present

## 2012-11-22 DIAGNOSIS — I509 Heart failure, unspecified: Secondary | ICD-10-CM | POA: Diagnosis not present

## 2012-11-22 DIAGNOSIS — Z96659 Presence of unspecified artificial knee joint: Secondary | ICD-10-CM | POA: Diagnosis not present

## 2012-11-26 ENCOUNTER — Other Ambulatory Visit: Payer: Self-pay | Admitting: Neurosurgery

## 2012-11-26 DIAGNOSIS — M4802 Spinal stenosis, cervical region: Secondary | ICD-10-CM | POA: Diagnosis not present

## 2012-12-02 ENCOUNTER — Encounter (HOSPITAL_COMMUNITY): Payer: Self-pay | Admitting: Pharmacy Technician

## 2012-12-08 DIAGNOSIS — R350 Frequency of micturition: Secondary | ICD-10-CM | POA: Diagnosis not present

## 2012-12-08 DIAGNOSIS — R3915 Urgency of urination: Secondary | ICD-10-CM | POA: Diagnosis not present

## 2012-12-08 DIAGNOSIS — N393 Stress incontinence (female) (male): Secondary | ICD-10-CM | POA: Diagnosis not present

## 2012-12-08 DIAGNOSIS — R3911 Hesitancy of micturition: Secondary | ICD-10-CM | POA: Diagnosis not present

## 2012-12-10 ENCOUNTER — Encounter (HOSPITAL_COMMUNITY): Payer: Self-pay

## 2012-12-10 ENCOUNTER — Encounter (HOSPITAL_COMMUNITY)
Admission: RE | Admit: 2012-12-10 | Discharge: 2012-12-10 | Disposition: A | Discharge status: Home / Self Care | Payer: Medicare Other | Source: Ambulatory Visit | Attending: Neurosurgery | Admitting: Neurosurgery

## 2012-12-10 ENCOUNTER — Encounter (HOSPITAL_COMMUNITY)
Admission: RE | Admit: 2012-12-10 | Discharge: 2012-12-10 | Disposition: A | Discharge status: Home / Self Care | Payer: Medicare Other | Source: Ambulatory Visit | Attending: Anesthesiology | Admitting: Anesthesiology

## 2012-12-10 DIAGNOSIS — Z01810 Encounter for preprocedural cardiovascular examination: Secondary | ICD-10-CM | POA: Insufficient documentation

## 2012-12-10 DIAGNOSIS — Z01812 Encounter for preprocedural laboratory examination: Secondary | ICD-10-CM | POA: Insufficient documentation

## 2012-12-10 DIAGNOSIS — Z01818 Encounter for other preprocedural examination: Secondary | ICD-10-CM | POA: Insufficient documentation

## 2012-12-10 HISTORY — DX: Shortness of breath: R06.02

## 2012-12-10 LAB — CBC WITH DIFFERENTIAL/PLATELET
Eosinophils Relative: 2 % (ref 0–5)
HCT: 37.7 % (ref 36.0–46.0)
Hemoglobin: 12.3 g/dL (ref 12.0–15.0)
Lymphocytes Relative: 38 % (ref 12–46)
Lymphs Abs: 2.6 10*3/uL (ref 0.7–4.0)
MCV: 89.5 fL (ref 78.0–100.0)
Monocytes Absolute: 0.5 10*3/uL (ref 0.1–1.0)
Monocytes Relative: 8 % (ref 3–12)
RBC: 4.21 MIL/uL (ref 3.87–5.11)
WBC: 7 10*3/uL (ref 4.0–10.5)

## 2012-12-10 LAB — BASIC METABOLIC PANEL
CO2: 27 mEq/L (ref 19–32)
Chloride: 99 mEq/L (ref 96–112)
Sodium: 134 mEq/L — ABNORMAL LOW (ref 135–145)

## 2012-12-10 LAB — SURGICAL PCR SCREEN: Staphylococcus aureus: NEGATIVE

## 2012-12-10 NOTE — Pre-Procedure Instructions (Signed)
MARLYS STEGMAIER  12/10/2012   Your procedure is scheduled on:  Monday, December 15, 2012  Report to [facility] at 9:25 AM.  Call this number if you have problems the morning of surgery: [phone]   Remember:   Do not eat food or drink liquids after midnight.   Take these medicines the morning of surgery with A SIP OF WATER: xanax, amlodipine, lexapro, nexium,dilaudid,toviaz, levothyroxine   Do not wear jewelry, make-up or nail polish.  Do not wear lotions, powders, or perfumes. You may wear deodorant.  Do not shave 48 hours prior to surgery. Men may shave face and neck.  Do not bring valuables to the hospital.  [facility] is not responsible                   for any belongings or valuables.  Contacts, dentures or bridgework may not be worn into surgery.  Leave suitcase in the car. After surgery it may be brought to your room.  For patients admitted to the hospital, checkout time is 11:00 AM the day of  discharge.   Patients discharged the day of surgery will not be allowed to drive  home.  Name and phone number of your driver:   Special Instructions: Shower using CHG 2 nights before surgery and the night before surgery.  If you shower the day of surgery use CHG.  Use special wash - you have one bottle of CHG for all showers.  You should use approximately 1/3 of the bottle for each shower.   Please read over the following fact sheets that you were given: Pain Booklet, Coughing and Deep Breathing, MRSA Information and Surgical Site Infection Prevention

## 2012-12-10 NOTE — Progress Notes (Addendum)
Primary: Dr.Fusco No cardiologist  Pt. Not sure when last sleep study was done ,but states it was at [facility] in [place] [facility] does not have any sleep studies on file.

## 2012-12-14 MED ORDER — CEFAZOLIN SODIUM-DEXTROSE 2-3 GM-% IV SOLR
2.0000 g | INTRAVENOUS | Status: AC
  Administered 2012-12-15: 2 g via INTRAVENOUS
  Filled 2012-12-14: qty 50

## 2012-12-15 ENCOUNTER — Inpatient Hospital Stay (HOSPITAL_COMMUNITY): Payer: Medicare Other | Admitting: Certified Registered"

## 2012-12-15 ENCOUNTER — Inpatient Hospital Stay (HOSPITAL_COMMUNITY): Payer: Medicare Other

## 2012-12-15 ENCOUNTER — Encounter (HOSPITAL_COMMUNITY): Payer: Self-pay | Admitting: *Deleted

## 2012-12-15 ENCOUNTER — Encounter (HOSPITAL_COMMUNITY)
Admission: RE | Disposition: A | Discharge status: Home / Self Care | Payer: Self-pay | Source: Ambulatory Visit | Attending: Neurosurgery

## 2012-12-15 ENCOUNTER — Inpatient Hospital Stay (HOSPITAL_COMMUNITY)
Admission: RE | Admit: 2012-12-15 | Discharge: 2012-12-16 | DRG: 472 | Disposition: A | Discharge status: Home / Self Care | Payer: Medicare Other | Source: Ambulatory Visit | Attending: Neurosurgery | Admitting: Neurosurgery

## 2012-12-15 ENCOUNTER — Encounter (HOSPITAL_COMMUNITY): Payer: Self-pay | Admitting: Certified Registered"

## 2012-12-15 DIAGNOSIS — Z96659 Presence of unspecified artificial knee joint: Secondary | ICD-10-CM | POA: Diagnosis not present

## 2012-12-15 DIAGNOSIS — M4802 Spinal stenosis, cervical region: Secondary | ICD-10-CM | POA: Diagnosis not present

## 2012-12-15 DIAGNOSIS — Z8249 Family history of ischemic heart disease and other diseases of the circulatory system: Secondary | ICD-10-CM

## 2012-12-15 DIAGNOSIS — M5020 Other cervical disc displacement, unspecified cervical region: Secondary | ICD-10-CM | POA: Diagnosis not present

## 2012-12-15 DIAGNOSIS — M5000 Cervical disc disorder with myelopathy, unspecified cervical region: Secondary | ICD-10-CM | POA: Diagnosis not present

## 2012-12-15 DIAGNOSIS — Z9089 Acquired absence of other organs: Secondary | ICD-10-CM

## 2012-12-15 DIAGNOSIS — I10 Essential (primary) hypertension: Secondary | ICD-10-CM | POA: Diagnosis present

## 2012-12-15 DIAGNOSIS — K589 Irritable bowel syndrome without diarrhea: Secondary | ICD-10-CM | POA: Diagnosis present

## 2012-12-15 DIAGNOSIS — G8929 Other chronic pain: Secondary | ICD-10-CM | POA: Diagnosis present

## 2012-12-15 DIAGNOSIS — Z96649 Presence of unspecified artificial hip joint: Secondary | ICD-10-CM

## 2012-12-15 DIAGNOSIS — E039 Hypothyroidism, unspecified: Secondary | ICD-10-CM | POA: Diagnosis present

## 2012-12-15 DIAGNOSIS — M5382 Other specified dorsopathies, cervical region: Secondary | ICD-10-CM | POA: Diagnosis not present

## 2012-12-15 DIAGNOSIS — Z90710 Acquired absence of both cervix and uterus: Secondary | ICD-10-CM | POA: Diagnosis not present

## 2012-12-15 DIAGNOSIS — M4712 Other spondylosis with myelopathy, cervical region: Secondary | ICD-10-CM | POA: Diagnosis not present

## 2012-12-15 DIAGNOSIS — Z79899 Other long term (current) drug therapy: Secondary | ICD-10-CM | POA: Diagnosis not present

## 2012-12-15 DIAGNOSIS — F411 Generalized anxiety disorder: Secondary | ICD-10-CM | POA: Diagnosis present

## 2012-12-15 DIAGNOSIS — F259 Schizoaffective disorder, unspecified: Secondary | ICD-10-CM | POA: Diagnosis present

## 2012-12-15 DIAGNOSIS — G4730 Sleep apnea, unspecified: Secondary | ICD-10-CM | POA: Diagnosis present

## 2012-12-15 DIAGNOSIS — Z823 Family history of stroke: Secondary | ICD-10-CM

## 2012-12-15 DIAGNOSIS — IMO0001 Reserved for inherently not codable concepts without codable children: Secondary | ICD-10-CM | POA: Diagnosis present

## 2012-12-15 DIAGNOSIS — I509 Heart failure, unspecified: Secondary | ICD-10-CM | POA: Diagnosis present

## 2012-12-15 DIAGNOSIS — K219 Gastro-esophageal reflux disease without esophagitis: Secondary | ICD-10-CM | POA: Diagnosis present

## 2012-12-15 DIAGNOSIS — M542 Cervicalgia: Secondary | ICD-10-CM | POA: Diagnosis not present

## 2012-12-15 HISTORY — PX: ANTERIOR CERVICAL DECOMP/DISCECTOMY FUSION: SHX1161

## 2012-12-15 SURGERY — ANTERIOR CERVICAL DECOMPRESSION/DISCECTOMY FUSION 2 LEVELS
Anesthesia: General | Wound class: Clean

## 2012-12-15 MED ORDER — HYDROMORPHONE HCL PF 1 MG/ML IJ SOLN
0.2500 mg | INTRAMUSCULAR | Status: DC | PRN
  Administered 2012-12-15 (×4): 0.5 mg via INTRAVENOUS

## 2012-12-15 MED ORDER — COLESEVELAM HCL 625 MG PO TABS
1875.0000 mg | ORAL_TABLET | Freq: Two times a day (BID) | ORAL | Status: DC
  Administered 2012-12-15: 1875 mg via ORAL
  Filled 2012-12-15 (×4): qty 3

## 2012-12-15 MED ORDER — HYDROCODONE-ACETAMINOPHEN 5-325 MG PO TABS: 1.0000 | ORAL_TABLET | ORAL | Status: DC | PRN

## 2012-12-15 MED ORDER — HYDROMORPHONE HCL 2 MG PO TABS
4.0000 mg | ORAL_TABLET | ORAL | Status: DC | PRN
  Administered 2012-12-15 – 2012-12-16 (×3): 4 mg via ORAL
  Filled 2012-12-15 (×3): qty 2

## 2012-12-15 MED ORDER — SUFENTANIL CITRATE 50 MCG/ML IV SOLN
INTRAVENOUS | Status: DC | PRN
  Administered 2012-12-15 (×2): 5 ug via INTRAVENOUS
  Administered 2012-12-15: 15 ug via INTRAVENOUS

## 2012-12-15 MED ORDER — SODIUM CHLORIDE 0.9 % IJ SOLN
3.0000 mL | Freq: Two times a day (BID) | INTRAMUSCULAR | Status: DC
  Administered 2012-12-15: 3 mL via INTRAVENOUS

## 2012-12-15 MED ORDER — LEVOTHYROXINE SODIUM 50 MCG PO TABS
50.0000 ug | ORAL_TABLET | Freq: Every day | ORAL | Status: DC
  Administered 2012-12-16: 50 ug via ORAL
  Filled 2012-12-15 (×2): qty 1

## 2012-12-15 MED ORDER — BISACODYL 10 MG RE SUPP: 10.0000 mg | Freq: Every day | RECTAL | Status: DC | PRN

## 2012-12-15 MED ORDER — ALIGN 4 MG PO CAPS
1.0000 | ORAL_CAPSULE | Freq: Every day | ORAL | Status: DC
  Filled 2012-12-15: qty 1

## 2012-12-15 MED ORDER — LISINOPRIL 40 MG PO TABS: 40.0000 mg | ORAL_TABLET | Freq: Every day | ORAL | Status: DC

## 2012-12-15 MED ORDER — SENNA 8.6 MG PO TABS
1.0000 | ORAL_TABLET | Freq: Two times a day (BID) | ORAL | Status: DC
  Filled 2012-12-15 (×3): qty 1

## 2012-12-15 MED ORDER — FESOTERODINE FUMARATE ER 8 MG PO TB24
8.0000 mg | ORAL_TABLET | Freq: Every day | ORAL | Status: DC
Start: 2012-12-15 — End: 2012-12-16
  Administered 2012-12-16: 8 mg via ORAL
  Filled 2012-12-15 (×2): qty 1

## 2012-12-15 MED ORDER — OXYCODONE HCL 5 MG/5ML PO SOLN: 5.0000 mg | Freq: Once | ORAL | Status: DC | PRN

## 2012-12-15 MED ORDER — SODIUM CHLORIDE 0.9 % IJ SOLN: 3.0000 mL | INTRAMUSCULAR | Status: DC | PRN

## 2012-12-15 MED ORDER — PHENOL 1.4 % MT LIQD
1.0000 | OROMUCOSAL | Status: DC | PRN
  Administered 2012-12-15: 1 via OROMUCOSAL
  Filled 2012-12-15: qty 177

## 2012-12-15 MED ORDER — THROMBIN 5000 UNITS EX SOLR
CUTANEOUS | Status: DC | PRN
  Administered 2012-12-15 (×2): 5000 [IU] via TOPICAL

## 2012-12-15 MED ORDER — HEMOSTATIC AGENTS (NO CHARGE) OPTIME
TOPICAL | Status: DC | PRN
  Administered 2012-12-15: 1 via TOPICAL

## 2012-12-15 MED ORDER — ROCURONIUM BROMIDE 100 MG/10ML IV SOLN
INTRAVENOUS | Status: DC | PRN
  Administered 2012-12-15: 50 mg via INTRAVENOUS

## 2012-12-15 MED ORDER — CYCLOBENZAPRINE HCL 10 MG PO TABS
ORAL_TABLET | ORAL | Status: AC
  Filled 2012-12-15: qty 1

## 2012-12-15 MED ORDER — LISINOPRIL 40 MG PO TABS
40.0000 mg | ORAL_TABLET | Freq: Every day | ORAL | Status: DC
  Administered 2012-12-15: 40 mg via ORAL
  Filled 2012-12-15 (×2): qty 1

## 2012-12-15 MED ORDER — ACETAMINOPHEN 650 MG RE SUPP: 650.0000 mg | RECTAL | Status: DC | PRN

## 2012-12-15 MED ORDER — ALUM & MAG HYDROXIDE-SIMETH 200-200-20 MG/5ML PO SUSP: 30.0000 mL | Freq: Four times a day (QID) | ORAL | Status: DC | PRN

## 2012-12-15 MED ORDER — MIDAZOLAM HCL 5 MG/5ML IJ SOLN
INTRAMUSCULAR | Status: DC | PRN
  Administered 2012-12-15: 2 mg via INTRAVENOUS

## 2012-12-15 MED ORDER — POLYVINYL ALCOHOL 1.4 % OP SOLN
1.0000 [drp] | Freq: Three times a day (TID) | OPHTHALMIC | Status: DC | PRN
  Filled 2012-12-15: qty 15

## 2012-12-15 MED ORDER — CYCLOBENZAPRINE HCL 10 MG PO TABS
10.0000 mg | ORAL_TABLET | Freq: Three times a day (TID) | ORAL | Status: DC | PRN
  Administered 2012-12-15: 10 mg via ORAL

## 2012-12-15 MED ORDER — ALPRAZOLAM 0.5 MG PO TABS
2.0000 mg | ORAL_TABLET | Freq: Two times a day (BID) | ORAL | Status: DC | PRN
  Administered 2012-12-15 – 2012-12-16 (×2): 2 mg via ORAL
  Filled 2012-12-15 (×2): qty 4

## 2012-12-15 MED ORDER — PANTOPRAZOLE SODIUM 40 MG PO TBEC: 80.0000 mg | DELAYED_RELEASE_TABLET | Freq: Every day | ORAL | Status: DC

## 2012-12-15 MED ORDER — CEFAZOLIN SODIUM 1-5 GM-% IV SOLN
1.0000 g | Freq: Three times a day (TID) | INTRAVENOUS | Status: AC
  Administered 2012-12-15 – 2012-12-16 (×2): 1 g via INTRAVENOUS
  Filled 2012-12-15 (×3): qty 50

## 2012-12-15 MED ORDER — SODIUM CHLORIDE 0.9 % IR SOLN
Status: DC | PRN
  Administered 2012-12-15: 13:00:00

## 2012-12-15 MED ORDER — ACETAMINOPHEN 325 MG PO TABS: 650.0000 mg | ORAL_TABLET | ORAL | Status: DC | PRN

## 2012-12-15 MED ORDER — DEXAMETHASONE SODIUM PHOSPHATE 10 MG/ML IJ SOLN
INTRAMUSCULAR | Status: AC
  Administered 2012-12-15: 10 mg via INTRAVENOUS
  Filled 2012-12-15: qty 1

## 2012-12-15 MED ORDER — OXYCODONE HCL 5 MG PO TABS
5.0000 mg | ORAL_TABLET | Freq: Once | ORAL | Status: DC | PRN
Start: 2012-12-15 — End: 2012-12-15

## 2012-12-15 MED ORDER — ONDANSETRON HCL 4 MG/2ML IJ SOLN: 4.0000 mg | INTRAMUSCULAR | Status: DC | PRN

## 2012-12-15 MED ORDER — ZOLPIDEM TARTRATE 5 MG PO TABS: 5.0000 mg | ORAL_TABLET | Freq: Every evening | ORAL | Status: DC | PRN

## 2012-12-15 MED ORDER — HYDROMORPHONE HCL PF 1 MG/ML IJ SOLN
INTRAMUSCULAR | Status: AC
  Filled 2012-12-15: qty 2

## 2012-12-15 MED ORDER — OXYCODONE-ACETAMINOPHEN 5-325 MG PO TABS
ORAL_TABLET | ORAL | Status: AC
  Filled 2012-12-15: qty 2

## 2012-12-15 MED ORDER — PROPOFOL 10 MG/ML IV BOLUS
INTRAVENOUS | Status: DC | PRN
  Administered 2012-12-15: 20 mg via INTRAVENOUS
  Administered 2012-12-15: 180 mg via INTRAVENOUS

## 2012-12-15 MED ORDER — DEXAMETHASONE SODIUM PHOSPHATE 10 MG/ML IJ SOLN: 10.0000 mg | INTRAMUSCULAR | Status: DC

## 2012-12-15 MED ORDER — AMLODIPINE BESYLATE 5 MG PO TABS
5.0000 mg | ORAL_TABLET | Freq: Two times a day (BID) | ORAL | Status: DC
  Administered 2012-12-16: 5 mg via ORAL
  Filled 2012-12-15 (×3): qty 1

## 2012-12-15 MED ORDER — PROMETHAZINE HCL 25 MG/ML IJ SOLN
INTRAMUSCULAR | Status: AC
  Filled 2012-12-15: qty 1

## 2012-12-15 MED ORDER — 0.9 % SODIUM CHLORIDE (POUR BTL) OPTIME
TOPICAL | Status: DC | PRN
  Administered 2012-12-15: 1000 mL

## 2012-12-15 MED ORDER — LIDOCAINE HCL (CARDIAC) 20 MG/ML IV SOLN
INTRAVENOUS | Status: DC | PRN
  Administered 2012-12-15: 100 mg via INTRAVENOUS

## 2012-12-15 MED ORDER — PROMETHAZINE HCL 25 MG/ML IJ SOLN
6.2500 mg | INTRAMUSCULAR | Status: DC | PRN
  Administered 2012-12-15: 12.5 mg via INTRAVENOUS

## 2012-12-15 MED ORDER — MENTHOL 3 MG MT LOZG
1.0000 | LOZENGE | OROMUCOSAL | Status: DC | PRN
  Filled 2012-12-15: qty 9

## 2012-12-15 MED ORDER — FERROUS SULFATE 325 (65 FE) MG PO TABS
325.0000 mg | ORAL_TABLET | Freq: Three times a day (TID) | ORAL | Status: DC
  Administered 2012-12-15 – 2012-12-16 (×2): 325 mg via ORAL
  Filled 2012-12-15 (×5): qty 1

## 2012-12-15 MED ORDER — OXYCODONE-ACETAMINOPHEN 5-325 MG PO TABS
1.0000 | ORAL_TABLET | ORAL | Status: DC | PRN
  Administered 2012-12-15: 2 via ORAL

## 2012-12-15 MED ORDER — ONDANSETRON HCL 4 MG/2ML IJ SOLN
INTRAMUSCULAR | Status: DC | PRN
  Administered 2012-12-15: 4 mg via INTRAVENOUS

## 2012-12-15 MED ORDER — POLYETHYLENE GLYCOL 3350 17 G PO PACK
17.0000 g | PACK | Freq: Every day | ORAL | Status: DC | PRN
  Filled 2012-12-15: qty 1

## 2012-12-15 MED ORDER — CARBOXYMETHYLCELLULOSE SODIUM 0.5 % OP SOLN: 1.0000 [drp] | Freq: Three times a day (TID) | OPHTHALMIC | Status: DC | PRN

## 2012-12-15 MED ORDER — FLEET ENEMA 7-19 GM/118ML RE ENEM
1.0000 | ENEMA | Freq: Once | RECTAL | Status: AC | PRN
  Filled 2012-12-15: qty 1

## 2012-12-15 MED ORDER — LACTATED RINGERS IV SOLN
INTRAVENOUS | Status: DC
  Administered 2012-12-15 (×2): via INTRAVENOUS

## 2012-12-15 MED ORDER — DOXEPIN HCL 75 MG PO CAPS
75.0000 mg | ORAL_CAPSULE | Freq: Every day | ORAL | Status: DC
  Administered 2012-12-15: 75 mg via ORAL
  Filled 2012-12-15 (×2): qty 1

## 2012-12-15 MED ORDER — HYDROMORPHONE HCL PF 1 MG/ML IJ SOLN: 0.5000 mg | INTRAMUSCULAR | Status: DC | PRN

## 2012-12-15 MED ORDER — ESCITALOPRAM OXALATE 20 MG PO TABS
20.0000 mg | ORAL_TABLET | Freq: Every day | ORAL | Status: DC
  Administered 2012-12-16: 20 mg via ORAL
  Filled 2012-12-15 (×2): qty 1

## 2012-12-15 SURGICAL SUPPLY — 55 items
BAG DECANTER FOR FLEXI CONT (MISCELLANEOUS) ×2 IMPLANT
BENZOIN TINCTURE PRP APPL 2/3 (GAUZE/BANDAGES/DRESSINGS) ×2 IMPLANT
BRUSH SCRUB EZ PLAIN DRY (MISCELLANEOUS) ×2 IMPLANT
BUR MATCHSTICK NEURO 3.0 LAGG (BURR) ×2 IMPLANT
CAGE PEEK 6X14X11 (Cage) ×2 IMPLANT
CAGE SPNL 11X14X6XRADOPQ (Cage) ×2 IMPLANT
CANISTER SUCTION 2500CC (MISCELLANEOUS) ×2 IMPLANT
CLOTH BEACON ORANGE TIMEOUT ST (SAFETY) ×2 IMPLANT
CONT SPEC 4OZ CLIKSEAL STRL BL (MISCELLANEOUS) ×2 IMPLANT
DRAPE C-ARM 42X72 X-RAY (DRAPES) ×4 IMPLANT
DRAPE LAPAROTOMY 100X72 PEDS (DRAPES) ×2 IMPLANT
DRAPE MICROSCOPE ZEISS OPMI (DRAPES) ×2 IMPLANT
DRAPE POUCH INSTRU U-SHP 10X18 (DRAPES) ×2 IMPLANT
DRILL BIT (BIT) ×2 IMPLANT
DURAPREP 6ML APPLICATOR 50/CS (WOUND CARE) ×2 IMPLANT
ELECT COATED BLADE 2.86 ST (ELECTRODE) ×2 IMPLANT
ELECT REM PT RETURN 9FT ADLT (ELECTROSURGICAL) ×2
ELECTRODE REM PT RTRN 9FT ADLT (ELECTROSURGICAL) ×1 IMPLANT
GAUZE SPONGE 4X4 16PLY XRAY LF (GAUZE/BANDAGES/DRESSINGS) IMPLANT
GLOVE BIO SURGEON STRL SZ8 (GLOVE) ×2 IMPLANT
GLOVE ECLIPSE 8.5 STRL (GLOVE) ×2 IMPLANT
GLOVE EXAM NITRILE LRG STRL (GLOVE) ×2 IMPLANT
GLOVE EXAM NITRILE MD LF STRL (GLOVE) IMPLANT
GLOVE EXAM NITRILE XL STR (GLOVE) IMPLANT
GLOVE EXAM NITRILE XS STR PU (GLOVE) IMPLANT
GLOVE INDICATOR 7.0 STRL GRN (GLOVE) ×2 IMPLANT
GLOVE OPTIFIT SS 8.5 STRL (GLOVE) ×2 IMPLANT
GLOVE SS N UNI LF 6.5 STRL (GLOVE) ×2 IMPLANT
GOWN BRE IMP SLV AUR LG STRL (GOWN DISPOSABLE) IMPLANT
GOWN BRE IMP SLV AUR XL STRL (GOWN DISPOSABLE) ×2 IMPLANT
GOWN STRL REIN 2XL LVL4 (GOWN DISPOSABLE) IMPLANT
HEAD HALTER (SOFTGOODS) ×2 IMPLANT
HEMOSTAT SURGICEL 2X14 (HEMOSTASIS) IMPLANT
KIT BASIN OR (CUSTOM PROCEDURE TRAY) ×2 IMPLANT
KIT ROOM TURNOVER OR (KITS) ×2 IMPLANT
NEEDLE SPNL 20GX3.5 QUINCKE YW (NEEDLE) ×2 IMPLANT
NS IRRIG 1000ML POUR BTL (IV SOLUTION) ×2 IMPLANT
PACK LAMINECTOMY NEURO (CUSTOM PROCEDURE TRAY) ×2 IMPLANT
PAD ARMBOARD 7.5X6 YLW CONV (MISCELLANEOUS) ×6 IMPLANT
PLATE VISION ELITE 40MM (Plate) ×2 IMPLANT
RUBBERBAND STERILE (MISCELLANEOUS) ×4 IMPLANT
SCREW ST 13X4XST VA NS SPNE (Screw) ×6 IMPLANT
SCREW ST VAR 4 ATL (Screw) ×6 IMPLANT
SPONGE GAUZE 4X4 12PLY (GAUZE/BANDAGES/DRESSINGS) ×2 IMPLANT
SPONGE INTESTINAL PEANUT (DISPOSABLE) ×2 IMPLANT
SPONGE SURGIFOAM ABS GEL SZ50 (HEMOSTASIS) ×2 IMPLANT
STRIP CLOSURE SKIN 1/2X4 (GAUZE/BANDAGES/DRESSINGS) ×2 IMPLANT
SUT PDS AB 5-0 P3 18 (SUTURE) ×2 IMPLANT
SUT VIC AB 3-0 SH 8-18 (SUTURE) ×2 IMPLANT
SYR 20ML ECCENTRIC (SYRINGE) ×2 IMPLANT
TAPE CLOTH 4X10 WHT NS (GAUZE/BANDAGES/DRESSINGS) ×2 IMPLANT
TOWEL OR 17X24 6PK STRL BLUE (TOWEL DISPOSABLE) ×2 IMPLANT
TOWEL OR 17X26 10 PK STRL BLUE (TOWEL DISPOSABLE) ×2 IMPLANT
TRAP SPECIMEN MUCOUS 40CC (MISCELLANEOUS) ×2 IMPLANT
WATER STERILE IRR 1000ML POUR (IV SOLUTION) ×2 IMPLANT

## 2012-12-15 NOTE — Brief Op Note (Signed)
12/15/2012  2:32 PM  PATIENT:  Margaret Cowan  62 y.o. female  PRE-OPERATIVE DIAGNOSIS:  STENOSIS/Herniated Nucleus Pulposus  POST-OPERATIVE DIAGNOSIS:  STENOSIS/Herniated Nucleus Pulposus  PROCEDURE:  Procedure(s): ANTERIOR CERVICAL DECOMPRESSION/DISCECTOMY FUSION CERVICAL THREE-FOUR,FOUR-FIVE (N/A)  SURGEON:  Surgeon(s) and Role:    * Temple Pacini, MD - Primary    * Cristi Loron, MD - Assisting  PHYSICIAN ASSISTANT:   ASSISTANTS:    ANESTHESIA:   general  EBL:  Total I/O In: 1000 [I.V.:1000] Out: -   BLOOD ADMINISTERED:none  DRAINS: none   LOCAL MEDICATIONS USED:  NONE  SPECIMEN:  No Specimen  DISPOSITION OF SPECIMEN:  N/A  COUNTS:  YES  TOURNIQUET:  * No tourniquets in log *  DICTATION: .Dragon Dictation  PLAN OF CARE: Admit to inpatient   PATIENT DISPOSITION:  PACU - hemodynamically stable.   Delay start of Pharmacological VTE agent (>24hrs) due to surgical blood loss or risk of bleeding: yes

## 2012-12-15 NOTE — Anesthesia Preprocedure Evaluation (Signed)
Anesthesia Evaluation  Patient identified by MRN, date of birth, ID band Patient awake    Reviewed: Allergy & Precautions, H&P , NPO status , Patient's Chart, lab work & pertinent test results  Airway Mallampati: II TM Distance: >3 FB Neck ROM: Full    Dental  (+) Teeth Intact   Pulmonary          Cardiovascular hypertension, Pt. on medications +CHF     Neuro/Psych    GI/Hepatic GERD-  Medicated and Controlled,  Endo/Other  Hypothyroidism   Renal/GU      Musculoskeletal  (+) Fibromyalgia -  Abdominal   Peds  Hematology   Anesthesia Other Findings   Reproductive/Obstetrics                           Anesthesia Physical Anesthesia Plan  ASA: III  Anesthesia Plan: General   Post-op Pain Management:    Induction: Intravenous  Airway Management Planned: Oral ETT  Additional Equipment:   Intra-op Plan:   Post-operative Plan: Extubation in OR  Informed Consent: I have reviewed the patients History and Physical, chart, labs and discussed the procedure including the risks, benefits and alternatives for the proposed anesthesia with the patient or authorized representative who has indicated his/her understanding and acceptance.   Dental advisory given  Plan Discussed with: CRNA, Anesthesiologist and Surgeon  Anesthesia Plan Comments:         Anesthesia Quick Evaluation

## 2012-12-15 NOTE — Transfer of Care (Signed)
Immediate Anesthesia Transfer of Care Note  Patient: Margaret Cowan  Procedure(s) Performed: Procedure(s): ANTERIOR CERVICAL DECOMPRESSION/DISCECTOMY FUSION CERVICAL THREE-FOUR,FOUR-FIVE (N/A)  Patient Location: PACU  Anesthesia Type:General  Level of Consciousness: awake, alert  and oriented  Airway & Oxygen Therapy: Patient Spontanous Breathing and Patient connected to nasal cannula oxygen  Post-op Assessment: Report given to PACU RN, Post -op Vital signs reviewed and stable and Patient moving all extremities  Post vital signs: Reviewed and stable  Complications: No apparent anesthesia complications

## 2012-12-15 NOTE — Plan of Care (Signed)
Problem: Consults Goal: Diagnosis - Spinal Surgery Outcome: Completed/Met Date Met:  12/15/12 Cervical Spine Fusion     

## 2012-12-15 NOTE — Op Note (Signed)
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 Right C4-5 herniated nucleus pulposus with radiculopathy  Postoperative diagnosis: Same  Procedure Name: C3-4 and C4-5 anterior cervical discectomy and fusion with interbody peek cage, local autograft, and anterior plate instrumentation.  Surgeon:Anan Dapolito A.Coryn Mosso, M.D.  Asst. Surgeon: Lovell Sheehan  Anesthesia: General  Indication: 62 year old female with neck pain and bilateral upper extremity symptoms. Workup demonstrates moderately severe stenosis with spinal cord compression at C3-4 and a right-sided C4-5 disc herniation with compression of the right C5 nerve root. Patient's failed conservative management and presents now for C3-4 and C4-5 anterior cervical decompression infusion in hopes of improving her symptoms.  Operative note: After induction of anesthesia, patient positioned supine with neck slightly extended and held in place of halter traction. Patient's anterior cervical region was prepped and draped sterilely. Incision was made overlying the C4 interspace. This carried down sharply to the platysma. Is then divided vertically and dissection proceeded on the medial border of the sternocleidomastoid muscle and carotid sheath. Trachea and esophagus are mobilized and retracted towards the left. Prevertebral fascia stripped off the anterior spinal column. Longus colli muscles elevated bilaterally using electric cautery. Deep self-retaining traction placed intraoperative fluoroscopy is used and the levels were confirmed. The spaces at C3-4 and C4-5 are then incised with 15 blade or talar fashion. Wide disc space cleanout was achieved using pituitary rongeurs forward and backward angle Carlen curettes Kerrison rongeurs high-speed drill. All meds of the disc removed down to the posterior annulus. Microscope and brought field these at the remainder of the discectomy  remaining aspects of annulus and removed using high-speed drill. Underlying posterior longitudinal was then elevated and resected piecemeal fashion. Wide central decompression and perform undercutting the bodies of C3 and C4. Decompression MCH are of foramen. Wide anterior foraminotomies were then performed on of course exiting C4 nerve roots. At this point a very 30 depression achieved. There is no his injury thecal sac and nerve roots. Procedure then repeated at C4-5 again without complication. Wound is then irrigated out like solution. Bone graft which had been harvested from the discectomy was then packed into [employer] anatomic peek cages. Each 6 mm cage was then impacted in place and recessed slightly from the anterior cortical margin. A 40 mm Atlantis anterior cervical plate was then placed over the C3-4-5 levels. This is an attachment or fluoroscopic guidance and 13 under verbal and the screws 2 each in all 3 levels. All 6 screws and final tightening be solidly within bone. Locking screws were engaged at all 3 levels. Final images real good position of the cages and instrumentation at the proper upper level normal and the spine. Wound is then irrigated with saline solution. Gelfoam was placed off for hemostasis. Wounds and close in layers. Steri-Strips triggers were applied. There were no apparent outpatient. Patient well and she returns to recovery postop.
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
## 2012-12-15 NOTE — Preoperative (Signed)
Beta Blockers   Reason not to administer Beta Blockers:Not Applicable 

## 2012-12-15 NOTE — Anesthesia Postprocedure Evaluation (Signed)
  Anesthesia Post-op Note  Patient: Margaret Cowan  Procedure(s) Performed: Procedure(s): ANTERIOR CERVICAL DECOMPRESSION/DISCECTOMY FUSION CERVICAL THREE-FOUR,FOUR-FIVE (N/A)  Patient Location: PACU  Anesthesia Type:General  Level of Consciousness: awake and alert   Airway and Oxygen Therapy: Patient Spontanous Breathing  Post-op Pain: mild  Post-op Assessment: Post-op Vital signs reviewed, Patient's Cardiovascular Status Stable, Respiratory Function Stable, Patent Airway, No signs of Nausea or vomiting and Pain level controlled  Post-op Vital Signs: stable  Complications: No apparent anesthesia complications

## 2012-12-16 ENCOUNTER — Encounter (HOSPITAL_COMMUNITY): Payer: Self-pay | Admitting: Neurosurgery

## 2012-12-16 NOTE — Progress Notes (Signed)
Pt. Alert and oriented,follows simple instructions, denies pain. Incision area without swelling, redness or S/S of infection. Voiding adequate clear yellow urine. Moving all extremities well and vitals stable and documented. Anterior Cervical surgery notes instructions given to patient and family member for home safety and precautions. Pt. and family stated understanding of instructions given.

## 2012-12-16 NOTE — Discharge Summary (Signed)
Physician Discharge Summary  Patient ID: Margaret Cowan MRN: [id number] DOB/AGE: [birth year] 62 y.o.  Admit date: 12/15/2012 Discharge date: 12/16/2012  Admission Diagnoses:  Discharge Diagnoses:  Principal Problem:   Spinal stenosis in cervical region   Discharged Condition: good  Hospital Course: Patient admitted to the hospital where she underwent an uncomplicated 2 level anterior cervical discectomy and fusion. Postoperatively she is doing very well. Neck and upper extremity  a pain and weakness much better. Patient ambulatory and ready for discharge home.  Consults:   Significant Diagnostic Studies:   Treatments:   Discharge Exam: Blood pressure 124/78, pulse 86, temperature 98.9 F (37.2 C), temperature source Oral, resp. rate 18, SpO2 93.00%. Awake and alert. Oriented and appropriate. Cranial nerve function intact. Motor and sensory function intact. Wound clean and dry. Chest and abdomen benign.  Disposition: 06-Home-Health Care Svc     Medication List         ALIGN 4 MG Caps  Take 1 capsule by mouth daily.     alprazolam 2 MG tablet  Commonly known as:  XANAX  Take 2 mg by mouth 2 (two) times daily as needed for sleep or anxiety.     amLODipine 5 MG tablet  Commonly known as:  NORVASC  Take 5 mg by mouth 2 (two) times daily.     colesevelam 625 MG tablet  Commonly known as:  WELCHOL  Take 1,875 mg by mouth 2 (two) times daily with a meal.     [employer]S STOOL SOFTENER PO  Take 3 capsules by mouth at bedtime.     CYANOCOBALAMIN IJ  Inject 1 application as directed every 30 (thirty) days.     doxepin 25 MG capsule  Commonly known as:  SINEQUAN  Take 75 mg by mouth at bedtime.     escitalopram 20 MG tablet  Commonly known as:  LEXAPRO  Take 20 mg by mouth daily.     esomeprazole 40 MG capsule  Commonly known as:  NEXIUM  Take 40 mg by mouth daily.     ferrous sulfate 325 (65 FE) MG tablet  Take 1 tablet (325 mg total) by mouth 3 (three) times daily after  meals.     Fish Oil 1000 MG Caps  Take 1 capsule by mouth 3 (three) times daily.     HYDROmorphone 4 MG tablet  Commonly known as:  DILAUDID  Take 1 tablet (4 mg total) by mouth every 4 (four) hours as needed. Pain     levothyroxine 50 MCG tablet  Commonly known as:  SYNTHROID, LEVOTHROID  Take 50 mcg by mouth daily before breakfast.     lisinopril 40 MG tablet  Commonly known as:  PRINIVIL,ZESTRIL  Take 40 mg by mouth daily before lunch.     polyethylene glycol packet  Commonly known as:  MIRALAX / GLYCOLAX  Take 17 g by mouth daily as needed. constipation     promethazine 25 MG tablet  Commonly known as:  PHENERGAN  Take 25 mg by mouth daily as needed. Nausea     REFRESH TEARS 0.5 % Soln  Generic drug:  carboxymethylcellulose  Place 1 drop into both eyes 3 (three) times daily as needed (for dry eyes).     TOVIAZ 8 MG Tb24 tablet  Generic drug:  fesoterodine  Take 8 mg by mouth daily.           Follow-up Information   Follow up with Temple Pacini, MD.   Specialty:  Neurosurgery   Contact  information:   [address] [place] [address] [phone]       Signed: Gitty Osterlund A 12/16/2012, 10:35 AM

## 2012-12-16 NOTE — Progress Notes (Signed)
UR COMPLETED  

## 2012-12-17 DIAGNOSIS — F259 Schizoaffective disorder, unspecified: Secondary | ICD-10-CM | POA: Diagnosis not present

## 2012-12-17 DIAGNOSIS — I10 Essential (primary) hypertension: Secondary | ICD-10-CM | POA: Diagnosis not present

## 2012-12-17 DIAGNOSIS — G609 Hereditary and idiopathic neuropathy, unspecified: Secondary | ICD-10-CM | POA: Diagnosis not present

## 2012-12-17 DIAGNOSIS — IMO0001 Reserved for inherently not codable concepts without codable children: Secondary | ICD-10-CM | POA: Diagnosis not present

## 2012-12-17 DIAGNOSIS — D510 Vitamin B12 deficiency anemia due to intrinsic factor deficiency: Secondary | ICD-10-CM | POA: Diagnosis not present

## 2012-12-17 DIAGNOSIS — I509 Heart failure, unspecified: Secondary | ICD-10-CM | POA: Diagnosis not present

## 2012-12-19 ENCOUNTER — Emergency Department (HOSPITAL_COMMUNITY)
Admission: EM | Admit: 2012-12-19 | Discharge: 2012-12-19 | Disposition: A | Discharge status: Home / Self Care | Payer: Medicare Other | Attending: Emergency Medicine | Admitting: Emergency Medicine

## 2012-12-19 ENCOUNTER — Encounter (HOSPITAL_COMMUNITY): Payer: Self-pay

## 2012-12-19 ENCOUNTER — Emergency Department (HOSPITAL_COMMUNITY): Payer: Medicare Other

## 2012-12-19 DIAGNOSIS — F259 Schizoaffective disorder, unspecified: Secondary | ICD-10-CM | POA: Insufficient documentation

## 2012-12-19 DIAGNOSIS — F411 Generalized anxiety disorder: Secondary | ICD-10-CM | POA: Insufficient documentation

## 2012-12-19 DIAGNOSIS — D649 Anemia, unspecified: Secondary | ICD-10-CM | POA: Insufficient documentation

## 2012-12-19 DIAGNOSIS — I509 Heart failure, unspecified: Secondary | ICD-10-CM | POA: Insufficient documentation

## 2012-12-19 DIAGNOSIS — M542 Cervicalgia: Secondary | ICD-10-CM | POA: Diagnosis not present

## 2012-12-19 DIAGNOSIS — Z8542 Personal history of malignant neoplasm of other parts of uterus: Secondary | ICD-10-CM | POA: Diagnosis not present

## 2012-12-19 DIAGNOSIS — I10 Essential (primary) hypertension: Secondary | ICD-10-CM | POA: Diagnosis not present

## 2012-12-19 DIAGNOSIS — E039 Hypothyroidism, unspecified: Secondary | ICD-10-CM | POA: Insufficient documentation

## 2012-12-19 DIAGNOSIS — K219 Gastro-esophageal reflux disease without esophagitis: Secondary | ICD-10-CM | POA: Insufficient documentation

## 2012-12-19 DIAGNOSIS — G8929 Other chronic pain: Secondary | ICD-10-CM | POA: Diagnosis not present

## 2012-12-19 DIAGNOSIS — Z8739 Personal history of other diseases of the musculoskeletal system and connective tissue: Secondary | ICD-10-CM | POA: Insufficient documentation

## 2012-12-19 DIAGNOSIS — G8918 Other acute postprocedural pain: Secondary | ICD-10-CM | POA: Diagnosis not present

## 2012-12-19 DIAGNOSIS — Z79899 Other long term (current) drug therapy: Secondary | ICD-10-CM | POA: Diagnosis not present

## 2012-12-19 DIAGNOSIS — M5090 Cervical disc disorder, unspecified, unspecified cervical region: Secondary | ICD-10-CM | POA: Diagnosis not present

## 2012-12-19 LAB — CBC WITH DIFFERENTIAL/PLATELET
HCT: 35.2 % — ABNORMAL LOW (ref 36.0–46.0)
Hemoglobin: 11.5 g/dL — ABNORMAL LOW (ref 12.0–15.0)
Lymphocytes Relative: 31 % (ref 12–46)
Lymphs Abs: 2.4 10*3/uL (ref 0.7–4.0)
MCHC: 32.7 g/dL (ref 30.0–36.0)
Monocytes Absolute: 0.6 10*3/uL (ref 0.1–1.0)
Monocytes Relative: 7 % (ref 3–12)
Neutro Abs: 4.7 10*3/uL (ref 1.7–7.7)
WBC: 7.8 10*3/uL (ref 4.0–10.5)

## 2012-12-19 LAB — BASIC METABOLIC PANEL
BUN: 11 mg/dL (ref 6–23)
CO2: 31 mEq/L (ref 19–32)
Chloride: 98 mEq/L (ref 96–112)
Creatinine, Ser: 0.52 mg/dL (ref 0.50–1.10)

## 2012-12-19 MED ORDER — DEXAMETHASONE SODIUM PHOSPHATE 10 MG/ML IJ SOLN
10.0000 mg | Freq: Once | INTRAMUSCULAR | Status: AC
  Administered 2012-12-19: 10 mg via INTRAVENOUS

## 2012-12-19 MED ORDER — PREDNISONE 20 MG PO TABS: ORAL_TABLET | ORAL | Status: DC

## 2012-12-19 MED ORDER — SODIUM CHLORIDE 0.9 % IV BOLUS (SEPSIS)
500.0000 mL | Freq: Once | INTRAVENOUS | Status: AC
  Administered 2012-12-19: 500 mL via INTRAVENOUS

## 2012-12-19 MED ORDER — IOHEXOL 300 MG/ML  SOLN
80.0000 mL | Freq: Once | INTRAMUSCULAR | Status: AC | PRN
  Administered 2012-12-19: 75 mL via INTRAVENOUS

## 2012-12-19 MED ORDER — ONDANSETRON HCL 4 MG/2ML IJ SOLN
4.0000 mg | Freq: Once | INTRAMUSCULAR | Status: AC
  Administered 2012-12-19: 4 mg via INTRAVENOUS
  Filled 2012-12-19: qty 2

## 2012-12-19 MED ORDER — HYDROMORPHONE HCL PF 1 MG/ML IJ SOLN
1.0000 mg | Freq: Once | INTRAMUSCULAR | Status: DC
  Filled 2012-12-19: qty 1

## 2012-12-19 MED ORDER — DEXAMETHASONE SODIUM PHOSPHATE 10 MG/ML IJ SOLN
INTRAMUSCULAR | Status: AC
  Administered 2012-12-19: 10 mg via INTRAVENOUS
  Filled 2012-12-19: qty 1

## 2012-12-19 MED ORDER — HYDROMORPHONE HCL PF 1 MG/ML IJ SOLN
1.0000 mg | Freq: Once | INTRAMUSCULAR | Status: AC
  Administered 2012-12-19: 1 mg via INTRAVENOUS
  Filled 2012-12-19: qty 1

## 2012-12-19 NOTE — ED Notes (Addendum)
Patient had anterior approach cervical spine surgery Monday.  States last night began having episodes feeling like "her air was cutting off." Respirations even, nonlabored.  Tracheal airway sounds clear sitting and lying back.  C/O cervical spine pain, more severe since sitting up abruptly last night and pulling off bipap. Substantial bruising on anterior neck w/swelling.  Surgical incision intact.  Soft collar in use.  Patient states she takes PO Hydromorphone 4mg  QID managed by pain clinic.
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
## 2012-12-19 NOTE — ED Notes (Signed)
Called to room to listen to breathing anomaly.  When patient exhales there is a late expiratory stop with subsequent pronounced wheeze.  Patient states it feels like she loses her breath during these spells.

## 2012-12-19 NOTE — ED Notes (Signed)
Pt states she had anterior cervical surgery Monday. Complain of having difficulty breathing when she exhales. Also, complain of pain of back of neck. States she jerked her neck last night.bruising to anterior neck. Steri strips in place

## 2012-12-22 DIAGNOSIS — D510 Vitamin B12 deficiency anemia due to intrinsic factor deficiency: Secondary | ICD-10-CM | POA: Diagnosis not present

## 2012-12-22 DIAGNOSIS — G609 Hereditary and idiopathic neuropathy, unspecified: Secondary | ICD-10-CM | POA: Diagnosis not present

## 2012-12-22 DIAGNOSIS — F259 Schizoaffective disorder, unspecified: Secondary | ICD-10-CM | POA: Diagnosis not present

## 2012-12-22 DIAGNOSIS — I10 Essential (primary) hypertension: Secondary | ICD-10-CM | POA: Diagnosis not present

## 2012-12-22 DIAGNOSIS — IMO0001 Reserved for inherently not codable concepts without codable children: Secondary | ICD-10-CM | POA: Diagnosis not present

## 2012-12-22 DIAGNOSIS — I509 Heart failure, unspecified: Secondary | ICD-10-CM | POA: Diagnosis not present

## 2012-12-31 DIAGNOSIS — I509 Heart failure, unspecified: Secondary | ICD-10-CM | POA: Diagnosis not present

## 2012-12-31 DIAGNOSIS — D510 Vitamin B12 deficiency anemia due to intrinsic factor deficiency: Secondary | ICD-10-CM | POA: Diagnosis not present

## 2012-12-31 DIAGNOSIS — G609 Hereditary and idiopathic neuropathy, unspecified: Secondary | ICD-10-CM | POA: Diagnosis not present

## 2012-12-31 DIAGNOSIS — IMO0001 Reserved for inherently not codable concepts without codable children: Secondary | ICD-10-CM | POA: Diagnosis not present

## 2012-12-31 DIAGNOSIS — I10 Essential (primary) hypertension: Secondary | ICD-10-CM | POA: Diagnosis not present

## 2012-12-31 DIAGNOSIS — F259 Schizoaffective disorder, unspecified: Secondary | ICD-10-CM | POA: Diagnosis not present

## 2013-01-05 DIAGNOSIS — F259 Schizoaffective disorder, unspecified: Secondary | ICD-10-CM | POA: Diagnosis not present

## 2013-01-05 DIAGNOSIS — IMO0001 Reserved for inherently not codable concepts without codable children: Secondary | ICD-10-CM | POA: Diagnosis not present

## 2013-01-05 DIAGNOSIS — I10 Essential (primary) hypertension: Secondary | ICD-10-CM | POA: Diagnosis not present

## 2013-01-05 DIAGNOSIS — D510 Vitamin B12 deficiency anemia due to intrinsic factor deficiency: Secondary | ICD-10-CM | POA: Diagnosis not present

## 2013-01-05 DIAGNOSIS — G609 Hereditary and idiopathic neuropathy, unspecified: Secondary | ICD-10-CM | POA: Diagnosis not present

## 2013-01-05 DIAGNOSIS — I509 Heart failure, unspecified: Secondary | ICD-10-CM | POA: Diagnosis not present

## 2013-01-09 DIAGNOSIS — M545 Low back pain: Secondary | ICD-10-CM | POA: Diagnosis not present

## 2013-01-09 DIAGNOSIS — M169 Osteoarthritis of hip, unspecified: Secondary | ICD-10-CM | POA: Diagnosis not present

## 2013-01-09 DIAGNOSIS — M542 Cervicalgia: Secondary | ICD-10-CM | POA: Diagnosis not present

## 2013-01-09 DIAGNOSIS — M25569 Pain in unspecified knee: Secondary | ICD-10-CM | POA: Diagnosis not present

## 2013-01-13 DIAGNOSIS — Z6837 Body mass index (BMI) 37.0-37.9, adult: Secondary | ICD-10-CM | POA: Diagnosis not present

## 2013-01-13 DIAGNOSIS — F411 Generalized anxiety disorder: Secondary | ICD-10-CM | POA: Diagnosis not present

## 2013-01-13 DIAGNOSIS — E669 Obesity, unspecified: Secondary | ICD-10-CM | POA: Diagnosis not present

## 2013-01-19 DIAGNOSIS — I10 Essential (primary) hypertension: Secondary | ICD-10-CM | POA: Diagnosis not present

## 2013-01-19 DIAGNOSIS — IMO0001 Reserved for inherently not codable concepts without codable children: Secondary | ICD-10-CM | POA: Diagnosis not present

## 2013-01-19 DIAGNOSIS — D510 Vitamin B12 deficiency anemia due to intrinsic factor deficiency: Secondary | ICD-10-CM | POA: Diagnosis not present

## 2013-01-19 DIAGNOSIS — I509 Heart failure, unspecified: Secondary | ICD-10-CM | POA: Diagnosis not present

## 2013-01-19 DIAGNOSIS — G609 Hereditary and idiopathic neuropathy, unspecified: Secondary | ICD-10-CM | POA: Diagnosis not present

## 2013-01-19 DIAGNOSIS — F259 Schizoaffective disorder, unspecified: Secondary | ICD-10-CM | POA: Diagnosis not present

## 2013-01-21 DIAGNOSIS — M4802 Spinal stenosis, cervical region: Secondary | ICD-10-CM | POA: Diagnosis not present

## 2013-02-04 DIAGNOSIS — L570 Actinic keratosis: Secondary | ICD-10-CM | POA: Diagnosis not present

## 2013-02-04 DIAGNOSIS — D1800 Hemangioma unspecified site: Secondary | ICD-10-CM | POA: Diagnosis not present

## 2013-02-04 DIAGNOSIS — D485 Neoplasm of uncertain behavior of skin: Secondary | ICD-10-CM | POA: Diagnosis not present

## 2013-02-17 DIAGNOSIS — M4802 Spinal stenosis, cervical region: Secondary | ICD-10-CM | POA: Diagnosis not present

## 2013-02-23 DIAGNOSIS — H2510 Age-related nuclear cataract, unspecified eye: Secondary | ICD-10-CM | POA: Diagnosis not present

## 2013-02-23 DIAGNOSIS — H5200 Hypermetropia, unspecified eye: Secondary | ICD-10-CM | POA: Diagnosis not present

## 2013-02-23 DIAGNOSIS — H524 Presbyopia: Secondary | ICD-10-CM | POA: Diagnosis not present

## 2013-02-23 DIAGNOSIS — H52229 Regular astigmatism, unspecified eye: Secondary | ICD-10-CM | POA: Diagnosis not present

## 2013-04-01 ENCOUNTER — Other Ambulatory Visit (HOSPITAL_COMMUNITY): Payer: Self-pay | Admitting: Family Medicine

## 2013-04-01 DIAGNOSIS — IMO0002 Reserved for concepts with insufficient information to code with codable children: Secondary | ICD-10-CM | POA: Diagnosis not present

## 2013-04-01 DIAGNOSIS — Z6838 Body mass index (BMI) 38.0-38.9, adult: Secondary | ICD-10-CM | POA: Diagnosis not present

## 2013-04-01 DIAGNOSIS — G8929 Other chronic pain: Secondary | ICD-10-CM

## 2013-04-06 DIAGNOSIS — M25569 Pain in unspecified knee: Secondary | ICD-10-CM | POA: Diagnosis not present

## 2013-04-06 DIAGNOSIS — M545 Low back pain, unspecified: Secondary | ICD-10-CM | POA: Diagnosis not present

## 2013-04-06 DIAGNOSIS — M1610 Unilateral primary osteoarthritis, unspecified hip: Secondary | ICD-10-CM | POA: Diagnosis not present

## 2013-04-06 DIAGNOSIS — M542 Cervicalgia: Secondary | ICD-10-CM | POA: Diagnosis not present

## 2013-04-07 ENCOUNTER — Ambulatory Visit (HOSPITAL_COMMUNITY)
Admission: RE | Admit: 2013-04-07 | Discharge: 2013-04-07 | Disposition: A | Discharge status: Home / Self Care | Payer: Medicare Other | Source: Ambulatory Visit | Attending: Family Medicine | Admitting: Family Medicine

## 2013-04-07 DIAGNOSIS — IMO0002 Reserved for concepts with insufficient information to code with codable children: Secondary | ICD-10-CM

## 2013-04-07 DIAGNOSIS — M5124 Other intervertebral disc displacement, thoracic region: Secondary | ICD-10-CM | POA: Diagnosis not present

## 2013-04-07 DIAGNOSIS — M5126 Other intervertebral disc displacement, lumbar region: Secondary | ICD-10-CM | POA: Insufficient documentation

## 2013-04-07 DIAGNOSIS — G8929 Other chronic pain: Secondary | ICD-10-CM

## 2013-04-07 DIAGNOSIS — M47817 Spondylosis without myelopathy or radiculopathy, lumbosacral region: Secondary | ICD-10-CM | POA: Diagnosis not present

## 2013-04-09 DIAGNOSIS — H01009 Unspecified blepharitis unspecified eye, unspecified eyelid: Secondary | ICD-10-CM | POA: Diagnosis not present

## 2013-04-09 DIAGNOSIS — H33309 Unspecified retinal break, unspecified eye: Secondary | ICD-10-CM | POA: Diagnosis not present

## 2013-04-09 DIAGNOSIS — H43819 Vitreous degeneration, unspecified eye: Secondary | ICD-10-CM | POA: Diagnosis not present

## 2013-04-09 DIAGNOSIS — H2510 Age-related nuclear cataract, unspecified eye: Secondary | ICD-10-CM | POA: Diagnosis not present

## 2013-05-04 DIAGNOSIS — M25519 Pain in unspecified shoulder: Secondary | ICD-10-CM | POA: Diagnosis not present

## 2013-05-04 DIAGNOSIS — M23329 Other meniscus derangements, posterior horn of medial meniscus, unspecified knee: Secondary | ICD-10-CM | POA: Diagnosis not present

## 2013-05-04 DIAGNOSIS — M1610 Unilateral primary osteoarthritis, unspecified hip: Secondary | ICD-10-CM | POA: Diagnosis not present

## 2013-05-08 DIAGNOSIS — M545 Low back pain, unspecified: Secondary | ICD-10-CM | POA: Diagnosis not present

## 2013-05-08 DIAGNOSIS — M542 Cervicalgia: Secondary | ICD-10-CM | POA: Diagnosis not present

## 2013-05-08 DIAGNOSIS — M47817 Spondylosis without myelopathy or radiculopathy, lumbosacral region: Secondary | ICD-10-CM | POA: Diagnosis not present

## 2013-05-08 DIAGNOSIS — M1610 Unilateral primary osteoarthritis, unspecified hip: Secondary | ICD-10-CM | POA: Diagnosis not present

## 2013-05-08 DIAGNOSIS — M169 Osteoarthritis of hip, unspecified: Secondary | ICD-10-CM | POA: Diagnosis not present

## 2013-05-12 DIAGNOSIS — M4802 Spinal stenosis, cervical region: Secondary | ICD-10-CM | POA: Diagnosis not present

## 2013-05-12 DIAGNOSIS — E669 Obesity, unspecified: Secondary | ICD-10-CM | POA: Diagnosis not present

## 2013-05-21 DIAGNOSIS — G5600 Carpal tunnel syndrome, unspecified upper limb: Secondary | ICD-10-CM | POA: Diagnosis not present

## 2013-05-21 DIAGNOSIS — M4802 Spinal stenosis, cervical region: Secondary | ICD-10-CM | POA: Diagnosis not present

## 2013-05-27 DIAGNOSIS — M79609 Pain in unspecified limb: Secondary | ICD-10-CM | POA: Diagnosis not present

## 2013-05-27 DIAGNOSIS — M545 Low back pain, unspecified: Secondary | ICD-10-CM | POA: Diagnosis not present

## 2013-05-27 DIAGNOSIS — G894 Chronic pain syndrome: Secondary | ICD-10-CM | POA: Diagnosis not present

## 2013-05-27 DIAGNOSIS — M5412 Radiculopathy, cervical region: Secondary | ICD-10-CM | POA: Diagnosis not present

## 2013-05-27 DIAGNOSIS — M5137 Other intervertebral disc degeneration, lumbosacral region: Secondary | ICD-10-CM | POA: Diagnosis not present

## 2013-05-27 DIAGNOSIS — IMO0002 Reserved for concepts with insufficient information to code with codable children: Secondary | ICD-10-CM | POA: Diagnosis not present

## 2013-06-08 DIAGNOSIS — R3915 Urgency of urination: Secondary | ICD-10-CM | POA: Diagnosis not present

## 2013-06-08 DIAGNOSIS — N905 Atrophy of vulva: Secondary | ICD-10-CM | POA: Diagnosis not present

## 2013-06-15 DIAGNOSIS — G894 Chronic pain syndrome: Secondary | ICD-10-CM | POA: Diagnosis not present

## 2013-06-15 DIAGNOSIS — M545 Low back pain, unspecified: Secondary | ICD-10-CM | POA: Diagnosis not present

## 2013-06-15 DIAGNOSIS — M961 Postlaminectomy syndrome, not elsewhere classified: Secondary | ICD-10-CM | POA: Diagnosis not present

## 2013-06-17 DIAGNOSIS — M545 Low back pain, unspecified: Secondary | ICD-10-CM | POA: Diagnosis not present

## 2013-06-17 DIAGNOSIS — M79609 Pain in unspecified limb: Secondary | ICD-10-CM | POA: Diagnosis not present

## 2013-06-17 DIAGNOSIS — IMO0002 Reserved for concepts with insufficient information to code with codable children: Secondary | ICD-10-CM | POA: Diagnosis not present

## 2013-06-17 DIAGNOSIS — M5412 Radiculopathy, cervical region: Secondary | ICD-10-CM | POA: Diagnosis not present

## 2013-06-23 DIAGNOSIS — M545 Low back pain, unspecified: Secondary | ICD-10-CM | POA: Diagnosis not present

## 2013-06-23 DIAGNOSIS — M79609 Pain in unspecified limb: Secondary | ICD-10-CM | POA: Diagnosis not present

## 2013-06-23 DIAGNOSIS — M5412 Radiculopathy, cervical region: Secondary | ICD-10-CM | POA: Diagnosis not present

## 2013-06-23 DIAGNOSIS — IMO0002 Reserved for concepts with insufficient information to code with codable children: Secondary | ICD-10-CM | POA: Diagnosis not present

## 2013-06-25 DIAGNOSIS — M545 Low back pain, unspecified: Secondary | ICD-10-CM | POA: Diagnosis not present

## 2013-06-25 DIAGNOSIS — M79609 Pain in unspecified limb: Secondary | ICD-10-CM | POA: Diagnosis not present

## 2013-06-25 DIAGNOSIS — M5412 Radiculopathy, cervical region: Secondary | ICD-10-CM | POA: Diagnosis not present

## 2013-06-25 DIAGNOSIS — IMO0002 Reserved for concepts with insufficient information to code with codable children: Secondary | ICD-10-CM | POA: Diagnosis not present

## 2013-06-30 DIAGNOSIS — M5412 Radiculopathy, cervical region: Secondary | ICD-10-CM | POA: Diagnosis not present

## 2013-06-30 DIAGNOSIS — M545 Low back pain, unspecified: Secondary | ICD-10-CM | POA: Diagnosis not present

## 2013-06-30 DIAGNOSIS — IMO0002 Reserved for concepts with insufficient information to code with codable children: Secondary | ICD-10-CM | POA: Diagnosis not present

## 2013-06-30 DIAGNOSIS — M79609 Pain in unspecified limb: Secondary | ICD-10-CM | POA: Diagnosis not present

## 2013-07-01 DIAGNOSIS — Z6836 Body mass index (BMI) 36.0-36.9, adult: Secondary | ICD-10-CM | POA: Diagnosis not present

## 2013-07-01 DIAGNOSIS — F411 Generalized anxiety disorder: Secondary | ICD-10-CM | POA: Diagnosis not present

## 2013-07-01 DIAGNOSIS — G8929 Other chronic pain: Secondary | ICD-10-CM | POA: Diagnosis not present

## 2013-07-02 DIAGNOSIS — M545 Low back pain, unspecified: Secondary | ICD-10-CM | POA: Diagnosis not present

## 2013-07-02 DIAGNOSIS — M5412 Radiculopathy, cervical region: Secondary | ICD-10-CM | POA: Diagnosis not present

## 2013-07-02 DIAGNOSIS — IMO0002 Reserved for concepts with insufficient information to code with codable children: Secondary | ICD-10-CM | POA: Diagnosis not present

## 2013-07-02 DIAGNOSIS — M79609 Pain in unspecified limb: Secondary | ICD-10-CM | POA: Diagnosis not present

## 2013-07-07 DIAGNOSIS — IMO0002 Reserved for concepts with insufficient information to code with codable children: Secondary | ICD-10-CM | POA: Diagnosis not present

## 2013-07-07 DIAGNOSIS — M545 Low back pain, unspecified: Secondary | ICD-10-CM | POA: Diagnosis not present

## 2013-07-07 DIAGNOSIS — M5412 Radiculopathy, cervical region: Secondary | ICD-10-CM | POA: Diagnosis not present

## 2013-07-07 DIAGNOSIS — M79609 Pain in unspecified limb: Secondary | ICD-10-CM | POA: Diagnosis not present

## 2013-07-09 DIAGNOSIS — M545 Low back pain, unspecified: Secondary | ICD-10-CM | POA: Diagnosis not present

## 2013-07-09 DIAGNOSIS — M5412 Radiculopathy, cervical region: Secondary | ICD-10-CM | POA: Diagnosis not present

## 2013-07-09 DIAGNOSIS — IMO0002 Reserved for concepts with insufficient information to code with codable children: Secondary | ICD-10-CM | POA: Diagnosis not present

## 2013-07-09 DIAGNOSIS — M79609 Pain in unspecified limb: Secondary | ICD-10-CM | POA: Diagnosis not present

## 2013-07-14 DIAGNOSIS — IMO0002 Reserved for concepts with insufficient information to code with codable children: Secondary | ICD-10-CM | POA: Diagnosis not present

## 2013-07-14 DIAGNOSIS — M5412 Radiculopathy, cervical region: Secondary | ICD-10-CM | POA: Diagnosis not present

## 2013-07-14 DIAGNOSIS — M545 Low back pain, unspecified: Secondary | ICD-10-CM | POA: Diagnosis not present

## 2013-07-14 DIAGNOSIS — M79609 Pain in unspecified limb: Secondary | ICD-10-CM | POA: Diagnosis not present

## 2013-07-16 DIAGNOSIS — M545 Low back pain, unspecified: Secondary | ICD-10-CM | POA: Diagnosis not present

## 2013-07-16 DIAGNOSIS — IMO0002 Reserved for concepts with insufficient information to code with codable children: Secondary | ICD-10-CM | POA: Diagnosis not present

## 2013-07-16 DIAGNOSIS — M5412 Radiculopathy, cervical region: Secondary | ICD-10-CM | POA: Diagnosis not present

## 2013-07-16 DIAGNOSIS — M79609 Pain in unspecified limb: Secondary | ICD-10-CM | POA: Diagnosis not present

## 2013-07-20 DIAGNOSIS — Z6841 Body Mass Index (BMI) 40.0 and over, adult: Secondary | ICD-10-CM | POA: Diagnosis not present

## 2013-07-20 DIAGNOSIS — G5600 Carpal tunnel syndrome, unspecified upper limb: Secondary | ICD-10-CM | POA: Diagnosis not present

## 2013-07-28 DIAGNOSIS — M5412 Radiculopathy, cervical region: Secondary | ICD-10-CM | POA: Diagnosis not present

## 2013-07-28 DIAGNOSIS — IMO0002 Reserved for concepts with insufficient information to code with codable children: Secondary | ICD-10-CM | POA: Diagnosis not present

## 2013-07-28 DIAGNOSIS — M545 Low back pain, unspecified: Secondary | ICD-10-CM | POA: Diagnosis not present

## 2013-07-28 DIAGNOSIS — M79609 Pain in unspecified limb: Secondary | ICD-10-CM | POA: Diagnosis not present

## 2013-07-30 DIAGNOSIS — M545 Low back pain, unspecified: Secondary | ICD-10-CM | POA: Diagnosis not present

## 2013-07-30 DIAGNOSIS — M5412 Radiculopathy, cervical region: Secondary | ICD-10-CM | POA: Diagnosis not present

## 2013-07-30 DIAGNOSIS — IMO0002 Reserved for concepts with insufficient information to code with codable children: Secondary | ICD-10-CM | POA: Diagnosis not present

## 2013-07-30 DIAGNOSIS — M79609 Pain in unspecified limb: Secondary | ICD-10-CM | POA: Diagnosis not present

## 2013-07-31 DIAGNOSIS — M1610 Unilateral primary osteoarthritis, unspecified hip: Secondary | ICD-10-CM | POA: Diagnosis not present

## 2013-07-31 DIAGNOSIS — M169 Osteoarthritis of hip, unspecified: Secondary | ICD-10-CM | POA: Diagnosis not present

## 2013-07-31 DIAGNOSIS — M47817 Spondylosis without myelopathy or radiculopathy, lumbosacral region: Secondary | ICD-10-CM | POA: Diagnosis not present

## 2013-07-31 DIAGNOSIS — Z79899 Other long term (current) drug therapy: Secondary | ICD-10-CM | POA: Diagnosis not present

## 2013-07-31 DIAGNOSIS — M545 Low back pain, unspecified: Secondary | ICD-10-CM | POA: Diagnosis not present

## 2013-07-31 DIAGNOSIS — Z5181 Encounter for therapeutic drug level monitoring: Secondary | ICD-10-CM | POA: Diagnosis not present

## 2013-07-31 DIAGNOSIS — G894 Chronic pain syndrome: Secondary | ICD-10-CM | POA: Diagnosis not present

## 2013-08-04 DIAGNOSIS — L570 Actinic keratosis: Secondary | ICD-10-CM | POA: Diagnosis not present

## 2013-08-04 DIAGNOSIS — M545 Low back pain, unspecified: Secondary | ICD-10-CM | POA: Diagnosis not present

## 2013-08-04 DIAGNOSIS — IMO0002 Reserved for concepts with insufficient information to code with codable children: Secondary | ICD-10-CM | POA: Diagnosis not present

## 2013-08-04 DIAGNOSIS — D485 Neoplasm of uncertain behavior of skin: Secondary | ICD-10-CM | POA: Diagnosis not present

## 2013-08-04 DIAGNOSIS — D1800 Hemangioma unspecified site: Secondary | ICD-10-CM | POA: Diagnosis not present

## 2013-08-04 DIAGNOSIS — M79609 Pain in unspecified limb: Secondary | ICD-10-CM | POA: Diagnosis not present

## 2013-08-04 DIAGNOSIS — M5412 Radiculopathy, cervical region: Secondary | ICD-10-CM | POA: Diagnosis not present

## 2013-08-11 DIAGNOSIS — M545 Low back pain, unspecified: Secondary | ICD-10-CM | POA: Diagnosis not present

## 2013-08-11 DIAGNOSIS — M79609 Pain in unspecified limb: Secondary | ICD-10-CM | POA: Diagnosis not present

## 2013-08-11 DIAGNOSIS — M5412 Radiculopathy, cervical region: Secondary | ICD-10-CM | POA: Diagnosis not present

## 2013-08-11 DIAGNOSIS — IMO0002 Reserved for concepts with insufficient information to code with codable children: Secondary | ICD-10-CM | POA: Diagnosis not present

## 2013-08-13 DIAGNOSIS — IMO0002 Reserved for concepts with insufficient information to code with codable children: Secondary | ICD-10-CM | POA: Diagnosis not present

## 2013-08-13 DIAGNOSIS — M5412 Radiculopathy, cervical region: Secondary | ICD-10-CM | POA: Diagnosis not present

## 2013-08-13 DIAGNOSIS — M79609 Pain in unspecified limb: Secondary | ICD-10-CM | POA: Diagnosis not present

## 2013-08-13 DIAGNOSIS — M545 Low back pain, unspecified: Secondary | ICD-10-CM | POA: Diagnosis not present

## 2013-08-20 DIAGNOSIS — M545 Low back pain, unspecified: Secondary | ICD-10-CM | POA: Diagnosis not present

## 2013-08-20 DIAGNOSIS — M5412 Radiculopathy, cervical region: Secondary | ICD-10-CM | POA: Diagnosis not present

## 2013-08-20 DIAGNOSIS — M79609 Pain in unspecified limb: Secondary | ICD-10-CM | POA: Diagnosis not present

## 2013-08-20 DIAGNOSIS — IMO0002 Reserved for concepts with insufficient information to code with codable children: Secondary | ICD-10-CM | POA: Diagnosis not present

## 2013-08-25 DIAGNOSIS — M5412 Radiculopathy, cervical region: Secondary | ICD-10-CM | POA: Diagnosis not present

## 2013-08-25 DIAGNOSIS — M545 Low back pain, unspecified: Secondary | ICD-10-CM | POA: Diagnosis not present

## 2013-08-25 DIAGNOSIS — M79609 Pain in unspecified limb: Secondary | ICD-10-CM | POA: Diagnosis not present

## 2013-08-25 DIAGNOSIS — IMO0002 Reserved for concepts with insufficient information to code with codable children: Secondary | ICD-10-CM | POA: Diagnosis not present

## 2013-09-08 DIAGNOSIS — IMO0002 Reserved for concepts with insufficient information to code with codable children: Secondary | ICD-10-CM | POA: Diagnosis not present

## 2013-09-08 DIAGNOSIS — M5412 Radiculopathy, cervical region: Secondary | ICD-10-CM | POA: Diagnosis not present

## 2013-09-08 DIAGNOSIS — M545 Low back pain, unspecified: Secondary | ICD-10-CM | POA: Diagnosis not present

## 2013-09-08 DIAGNOSIS — M79609 Pain in unspecified limb: Secondary | ICD-10-CM | POA: Diagnosis not present

## 2013-09-11 DIAGNOSIS — Z1231 Encounter for screening mammogram for malignant neoplasm of breast: Secondary | ICD-10-CM | POA: Diagnosis not present

## 2013-09-29 ENCOUNTER — Encounter: Payer: Self-pay | Admitting: Cardiology

## 2013-09-29 DIAGNOSIS — D510 Vitamin B12 deficiency anemia due to intrinsic factor deficiency: Secondary | ICD-10-CM | POA: Diagnosis not present

## 2013-09-29 DIAGNOSIS — G4730 Sleep apnea, unspecified: Secondary | ICD-10-CM | POA: Diagnosis not present

## 2013-09-29 DIAGNOSIS — I10 Essential (primary) hypertension: Secondary | ICD-10-CM | POA: Diagnosis not present

## 2013-09-29 DIAGNOSIS — E785 Hyperlipidemia, unspecified: Secondary | ICD-10-CM | POA: Diagnosis not present

## 2013-09-29 DIAGNOSIS — E039 Hypothyroidism, unspecified: Secondary | ICD-10-CM | POA: Diagnosis not present

## 2013-09-29 DIAGNOSIS — Z6837 Body mass index (BMI) 37.0-37.9, adult: Secondary | ICD-10-CM | POA: Diagnosis not present

## 2013-10-05 ENCOUNTER — Ambulatory Visit (INDEPENDENT_AMBULATORY_CARE_PROVIDER_SITE_OTHER): Payer: Medicare Other | Admitting: Cardiology

## 2013-10-05 ENCOUNTER — Encounter: Payer: Self-pay | Admitting: Cardiology

## 2013-10-05 VITALS — BP 120/68 | HR 59 | Ht 64.0 in | Wt 204.0 lb

## 2013-10-05 DIAGNOSIS — R0609 Other forms of dyspnea: Secondary | ICD-10-CM

## 2013-10-05 DIAGNOSIS — I519 Heart disease, unspecified: Secondary | ICD-10-CM | POA: Diagnosis not present

## 2013-10-05 DIAGNOSIS — R42 Dizziness and giddiness: Secondary | ICD-10-CM | POA: Diagnosis not present

## 2013-10-05 DIAGNOSIS — R0600 Dyspnea, unspecified: Secondary | ICD-10-CM

## 2013-10-05 DIAGNOSIS — R0989 Other specified symptoms and signs involving the circulatory and respiratory systems: Secondary | ICD-10-CM
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
## 2013-10-05 NOTE — Patient Instructions (Signed)
Your physician has requested that you have an echocardiogram. Echocardiography is a painless test that uses sound waves to create images of your heart. It provides your doctor with information about the size and shape of your heart and how well your heart's chambers and valves are working. This procedure takes approximately one hour. There are no restrictions for this procedure. Your physician has recommended that you wear a 48 hour holter monitor. Holter monitors are medical devices that record the heart's electrical activity. Doctors most often use these monitors to diagnose arrhythmias. Arrhythmias are problems with the speed or rhythm of the heartbeat. The monitor is a small, portable device. You can wear one while you do your normal daily activities. This is usually used to diagnose what is causing palpitations/syncope (passing out). - office will contact you with date & time. Office will contact with results via phone or letter.   Continue all current medications. Follow up in  6 weeks

## 2013-10-13 IMAGING — CR DG LUMBAR SPINE COMPLETE W/ BEND
7 series · 7 of 7 positions shown · non-contrast
Comparison: Lumbar spine films of 09/28/2011

CLINICAL DATA: Chronic low back pain, no recent injury

LUMBAR SPINE - COMPLETE WITH BENDING VIEWS

[view not recorded (1 of 7)]
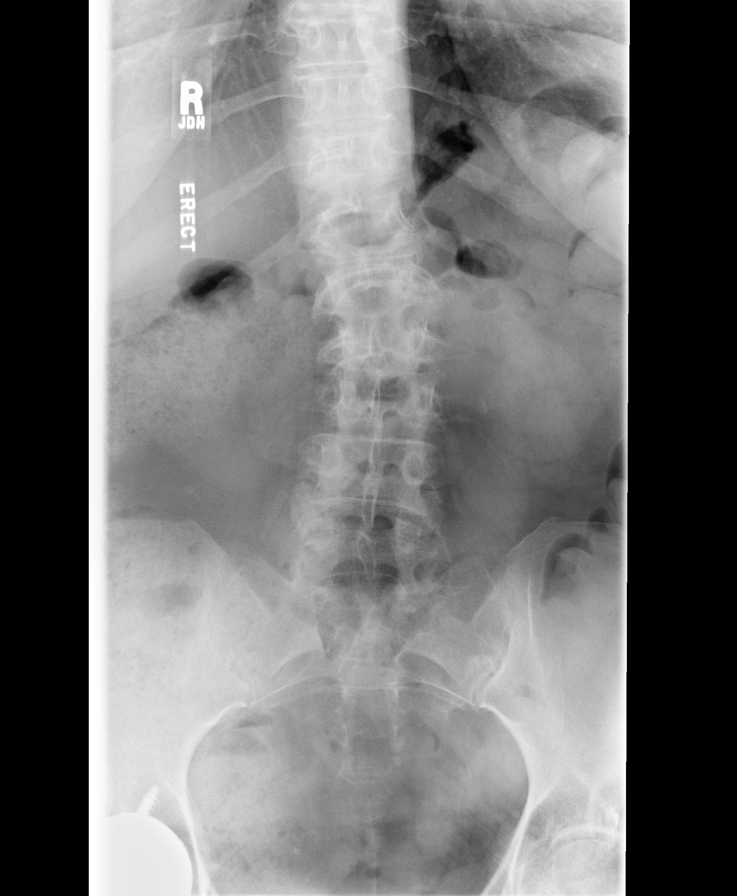

[view not recorded (2 of 7)]
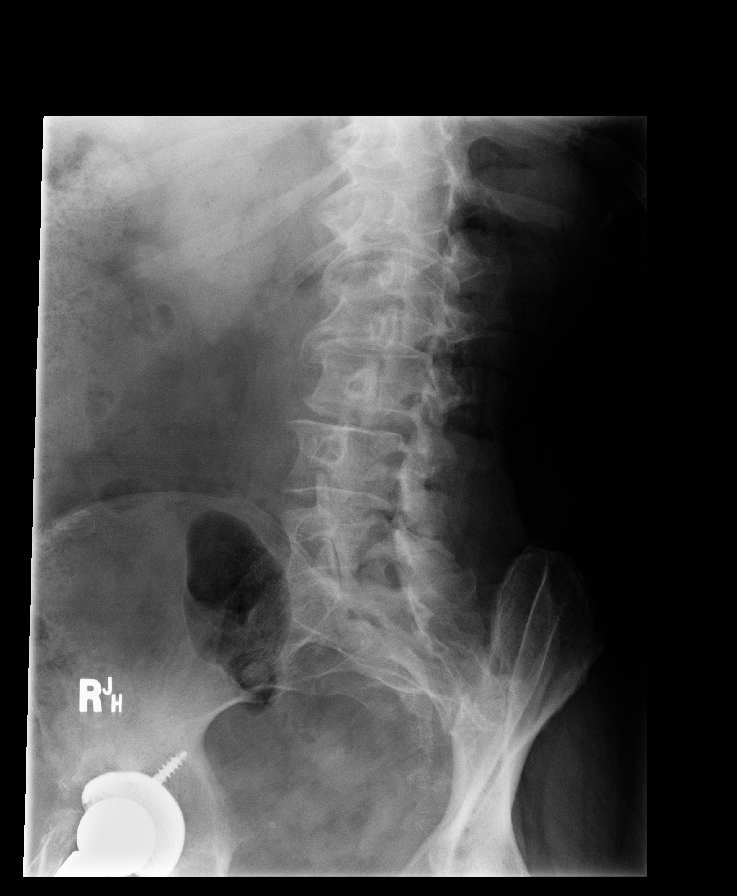

[view not recorded (3 of 7)]
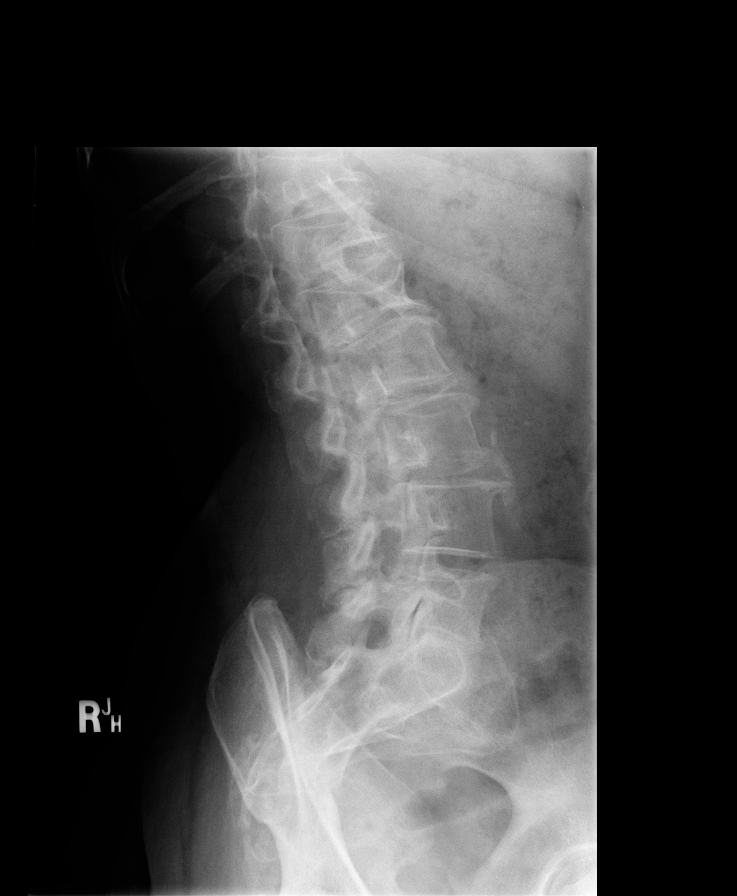

[view not recorded (4 of 7)]
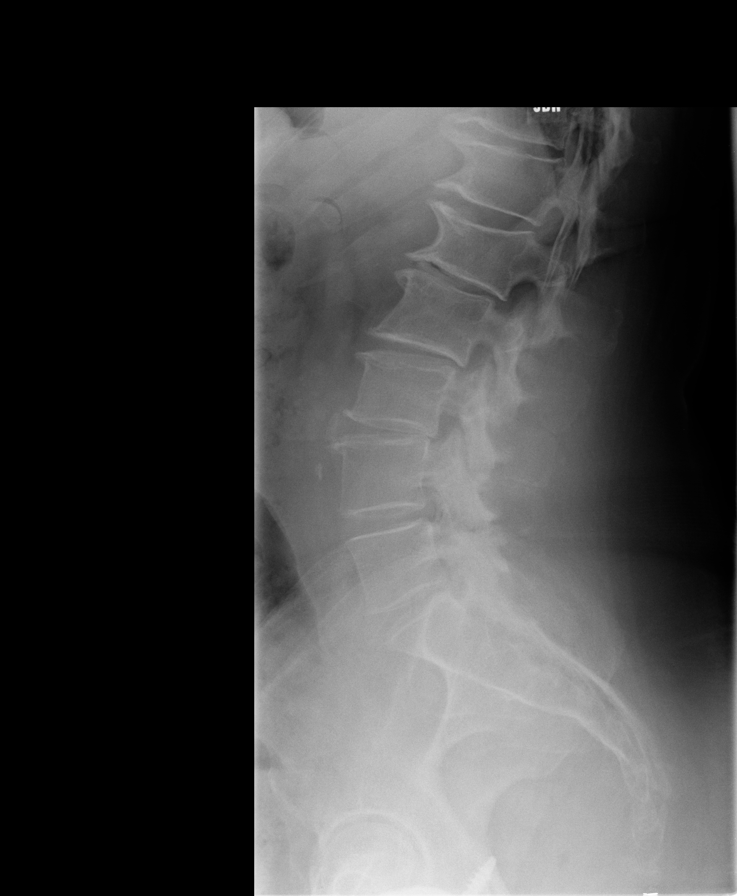

[view not recorded (5 of 7)]
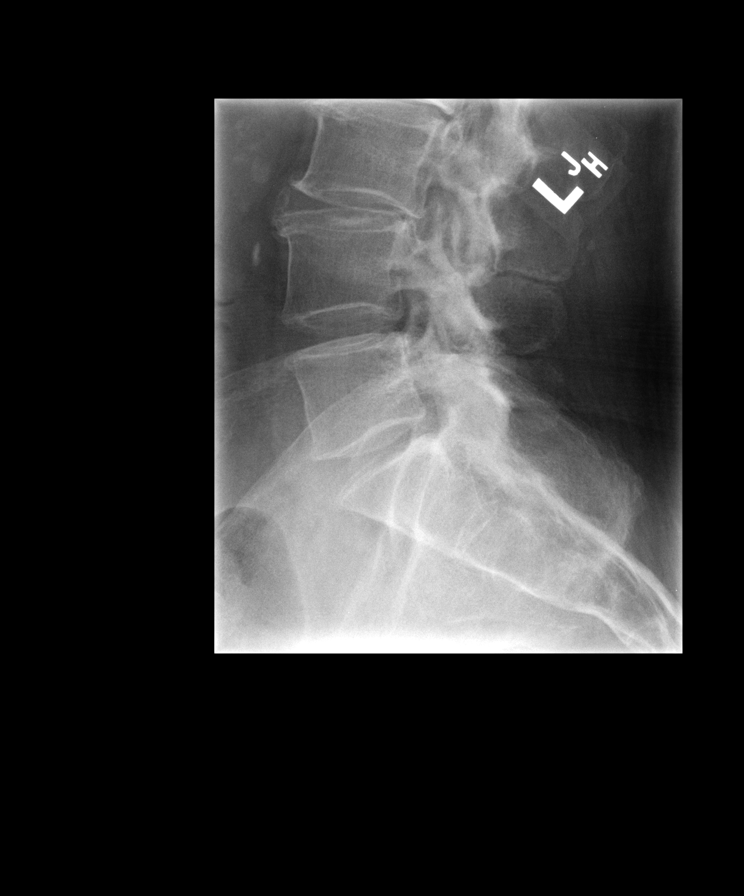

[view not recorded (6 of 7)]
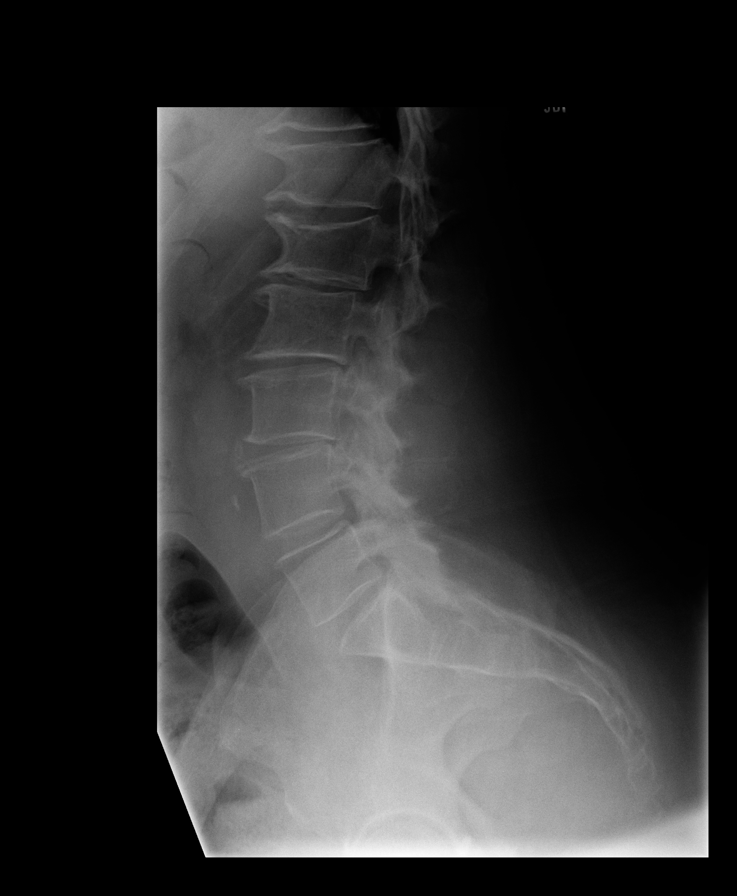

[view not recorded (7 of 7)]
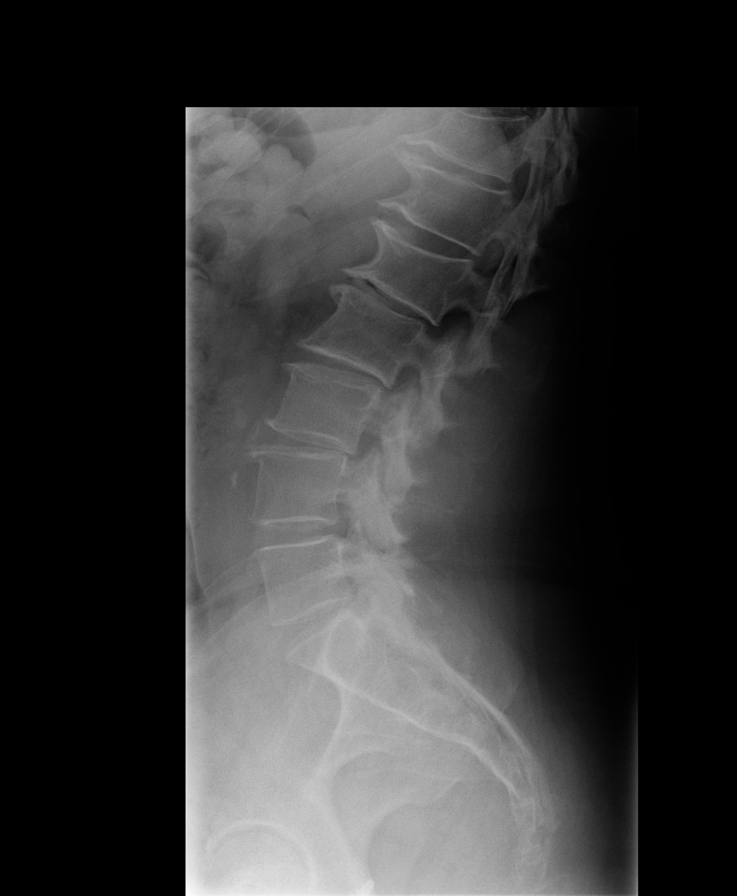

[7 of 7 positions shown; findings below may reference images not displayed]

FINDINGS: There is lumbar scoliosis convex to the left by
approximately 14 degrees.  On the lateral view the lumbar vertebrae
remain in normal alignment.  There is degenerative disc disease
particularly at L1-2, L2-3, and L3-4 levels.  No acute compression
deformity is seen.  There are large anterior osteophytes in the
lower thoracic and upper lumbar spine.  There is degenerative
change throughout the facet joints of the lower lumbar spine
particularly at L5-S1.  Right total hip replacement is noted.
IMPRESSION: 1.  No acute abnormality.
2.  Stable lumbar scoliosis convex to the left of 14 degrees.
3.  Degenerative disc disease primarily at L1-2, L2-3, and L3-4
levels.

## 2013-10-16 ENCOUNTER — Ambulatory Visit (INDEPENDENT_AMBULATORY_CARE_PROVIDER_SITE_OTHER): Payer: Medicare Other | Admitting: *Deleted

## 2013-10-16 DIAGNOSIS — R42 Dizziness and giddiness: Secondary | ICD-10-CM | POA: Diagnosis not present

## 2013-10-21 ENCOUNTER — Other Ambulatory Visit: Payer: Self-pay

## 2013-10-21 ENCOUNTER — Other Ambulatory Visit (INDEPENDENT_AMBULATORY_CARE_PROVIDER_SITE_OTHER): Payer: Medicare Other

## 2013-10-21 DIAGNOSIS — R0602 Shortness of breath: Secondary | ICD-10-CM | POA: Diagnosis not present

## 2013-10-21 DIAGNOSIS — R609 Edema, unspecified: Secondary | ICD-10-CM

## 2013-10-21 DIAGNOSIS — R0600 Dyspnea, unspecified: Secondary | ICD-10-CM

## 2013-10-29 ENCOUNTER — Telehealth: Payer: Self-pay | Admitting: *Deleted

## 2013-10-29 NOTE — Telephone Encounter (Signed)
Notes Recorded by Laurine Blazer, LPN on 8/71/9597 at 47:18 AM Patient notified. Already has follow up scheduled for 11/19/2013 with Dr. Harl Bowie. ------

## 2013-10-29 NOTE — Telephone Encounter (Signed)
Message copied by Laurine Blazer on Thu Oct 29, 2013 11:31 AM ------      Message from: Olney F      Created: Fri Oct 23, 2013 12:56 PM       Echo shows that her heart muscle is thickened, likely due to high blood pressure, and can be a common finding in patients with HTN. This can cause the hear to become stiff and can lead to some shortness of breath. Otherwise [place] looked good. Will talk more in detail at next visit            Zandra Abts MD ------

## 2013-11-18 DIAGNOSIS — M545 Low back pain, unspecified: Secondary | ICD-10-CM | POA: Diagnosis not present

## 2013-11-18 DIAGNOSIS — M47817 Spondylosis without myelopathy or radiculopathy, lumbosacral region: Secondary | ICD-10-CM | POA: Diagnosis not present

## 2013-11-18 DIAGNOSIS — M159 Polyosteoarthritis, unspecified: Secondary | ICD-10-CM | POA: Diagnosis not present

## 2013-11-18 DIAGNOSIS — Z79899 Other long term (current) drug therapy: Secondary | ICD-10-CM | POA: Diagnosis not present

## 2013-11-19 ENCOUNTER — Encounter: Payer: Self-pay | Admitting: Cardiology

## 2013-11-19 ENCOUNTER — Ambulatory Visit (INDEPENDENT_AMBULATORY_CARE_PROVIDER_SITE_OTHER): Payer: Medicare Other | Admitting: Cardiology

## 2013-11-19 VITALS — BP 120/74 | HR 60 | Ht 64.0 in | Wt 205.0 lb

## 2013-11-19 DIAGNOSIS — R002 Palpitations: Secondary | ICD-10-CM | POA: Diagnosis not present

## 2013-11-19 DIAGNOSIS — R609 Edema, unspecified: Secondary | ICD-10-CM

## 2013-11-19 DIAGNOSIS — R600 Localized edema: Secondary | ICD-10-CM

## 2013-11-19 NOTE — Patient Instructions (Signed)
Continue all current medications. Your physician wants you to follow up in:  1 year.  You will receive a reminder letter in the mail one-two months in advance.  If you don't receive a letter, please call our office to schedule the follow up appointment   
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
## 2013-12-01 DIAGNOSIS — M1710 Unilateral primary osteoarthritis, unspecified knee: Secondary | ICD-10-CM | POA: Diagnosis not present

## 2013-12-01 DIAGNOSIS — M19019 Primary osteoarthritis, unspecified shoulder: Secondary | ICD-10-CM | POA: Diagnosis not present

## 2013-12-14 DIAGNOSIS — N905 Atrophy of vulva: Secondary | ICD-10-CM | POA: Diagnosis not present

## 2013-12-15 DIAGNOSIS — M2540 Effusion, unspecified joint: Secondary | ICD-10-CM | POA: Diagnosis not present

## 2013-12-15 DIAGNOSIS — M545 Low back pain, unspecified: Secondary | ICD-10-CM | POA: Diagnosis not present

## 2013-12-15 DIAGNOSIS — M542 Cervicalgia: Secondary | ICD-10-CM | POA: Diagnosis not present

## 2013-12-15 DIAGNOSIS — G894 Chronic pain syndrome: Secondary | ICD-10-CM | POA: Diagnosis not present

## 2013-12-15 DIAGNOSIS — M25519 Pain in unspecified shoulder: Secondary | ICD-10-CM | POA: Diagnosis not present

## 2013-12-15 DIAGNOSIS — M19019 Primary osteoarthritis, unspecified shoulder: Secondary | ICD-10-CM | POA: Diagnosis not present

## 2013-12-15 DIAGNOSIS — M5137 Other intervertebral disc degeneration, lumbosacral region: Secondary | ICD-10-CM | POA: Diagnosis not present

## 2013-12-15 DIAGNOSIS — M5030 Other cervical disc degeneration, unspecified cervical region: Secondary | ICD-10-CM | POA: Diagnosis not present

## 2013-12-16 DIAGNOSIS — G4733 Obstructive sleep apnea (adult) (pediatric): Secondary | ICD-10-CM | POA: Diagnosis not present

## 2013-12-21 DIAGNOSIS — J069 Acute upper respiratory infection, unspecified: Secondary | ICD-10-CM | POA: Diagnosis not present

## 2013-12-21 DIAGNOSIS — Z6834 Body mass index (BMI) 34.0-34.9, adult: Secondary | ICD-10-CM | POA: Diagnosis not present

## 2013-12-23 DIAGNOSIS — Z6836 Body mass index (BMI) 36.0-36.9, adult: Secondary | ICD-10-CM | POA: Diagnosis not present

## 2013-12-23 DIAGNOSIS — R0609 Other forms of dyspnea: Secondary | ICD-10-CM | POA: Diagnosis not present

## 2013-12-23 DIAGNOSIS — G4733 Obstructive sleep apnea (adult) (pediatric): Secondary | ICD-10-CM | POA: Diagnosis not present

## 2013-12-23 DIAGNOSIS — R0989 Other specified symptoms and signs involving the circulatory and respiratory systems: Secondary | ICD-10-CM | POA: Diagnosis not present

## 2013-12-27 DIAGNOSIS — G4733 Obstructive sleep apnea (adult) (pediatric): Secondary | ICD-10-CM | POA: Diagnosis not present

## 2014-01-06 ENCOUNTER — Telehealth: Payer: Self-pay | Admitting: *Deleted

## 2014-01-06 DIAGNOSIS — R0683 Snoring: Secondary | ICD-10-CM | POA: Diagnosis not present

## 2014-01-06 DIAGNOSIS — Z8709 Personal history of other diseases of the respiratory system: Secondary | ICD-10-CM | POA: Diagnosis not present

## 2014-01-06 NOTE — Telephone Encounter (Signed)
   Pt states that at last OV her heart monitor results were given and Dr. Harl Bowie questioned why her heart rate slowed down late at night/ early morning around 2AM.  She forgot that she is taking "Sleep Ease". States that when she wakes up in the middle of the night she takes it to help her return back to sleep.  She is no longer taking it since realizing it could be the caused of her slowed down heart rate

## 2014-01-07 NOTE — Telephone Encounter (Signed)
It is actually fairly common and normal for the heart rate to slow down at night while sleeping. The medication sleep eze is diphenhydramine, basically just benadryl, and would not affect her heart rates.   Zandra Abts MD

## 2014-01-12 DIAGNOSIS — M25511 Pain in right shoulder: Secondary | ICD-10-CM | POA: Diagnosis not present

## 2014-01-12 DIAGNOSIS — M79672 Pain in left foot: Secondary | ICD-10-CM | POA: Diagnosis not present

## 2014-01-15 DIAGNOSIS — G4733 Obstructive sleep apnea (adult) (pediatric): Secondary | ICD-10-CM | POA: Diagnosis not present

## 2014-01-22 DIAGNOSIS — G4733 Obstructive sleep apnea (adult) (pediatric): Secondary | ICD-10-CM | POA: Diagnosis not present

## 2014-01-26 DIAGNOSIS — M5137 Other intervertebral disc degeneration, lumbosacral region: Secondary | ICD-10-CM | POA: Diagnosis not present

## 2014-01-26 DIAGNOSIS — M545 Low back pain: Secondary | ICD-10-CM | POA: Diagnosis not present

## 2014-01-26 DIAGNOSIS — G894 Chronic pain syndrome: Secondary | ICD-10-CM | POA: Diagnosis not present

## 2014-02-04 DIAGNOSIS — H5211 Myopia, right eye: Secondary | ICD-10-CM | POA: Diagnosis not present

## 2014-02-04 DIAGNOSIS — H35373 Puckering of macula, bilateral: Secondary | ICD-10-CM | POA: Diagnosis not present

## 2014-02-04 DIAGNOSIS — H5202 Hypermetropia, left eye: Secondary | ICD-10-CM | POA: Diagnosis not present

## 2014-02-04 DIAGNOSIS — H2513 Age-related nuclear cataract, bilateral: Secondary | ICD-10-CM | POA: Diagnosis not present

## 2014-02-10 DIAGNOSIS — R0683 Snoring: Secondary | ICD-10-CM | POA: Diagnosis not present

## 2014-02-10 DIAGNOSIS — Z8709 Personal history of other diseases of the respiratory system: Secondary | ICD-10-CM | POA: Diagnosis not present

## 2014-02-16 DIAGNOSIS — D1800 Hemangioma unspecified site: Secondary | ICD-10-CM | POA: Diagnosis not present

## 2014-02-16 DIAGNOSIS — D2271 Melanocytic nevi of right lower limb, including hip: Secondary | ICD-10-CM | POA: Diagnosis not present

## 2014-02-16 DIAGNOSIS — D2261 Melanocytic nevi of right upper limb, including shoulder: Secondary | ICD-10-CM | POA: Diagnosis not present

## 2014-02-16 DIAGNOSIS — D485 Neoplasm of uncertain behavior of skin: Secondary | ICD-10-CM | POA: Diagnosis not present

## 2014-02-16 DIAGNOSIS — D225 Melanocytic nevi of trunk: Secondary | ICD-10-CM | POA: Diagnosis not present

## 2014-02-17 DIAGNOSIS — Z79899 Other long term (current) drug therapy: Secondary | ICD-10-CM | POA: Diagnosis not present

## 2014-02-17 DIAGNOSIS — M47816 Spondylosis without myelopathy or radiculopathy, lumbar region: Secondary | ICD-10-CM | POA: Diagnosis not present

## 2014-02-17 DIAGNOSIS — M150 Primary generalized (osteo)arthritis: Secondary | ICD-10-CM | POA: Diagnosis not present

## 2014-02-17 DIAGNOSIS — Z5181 Encounter for therapeutic drug level monitoring: Secondary | ICD-10-CM | POA: Diagnosis not present

## 2014-02-17 DIAGNOSIS — G894 Chronic pain syndrome: Secondary | ICD-10-CM | POA: Diagnosis not present

## 2014-02-17 DIAGNOSIS — M545 Low back pain: Secondary | ICD-10-CM | POA: Diagnosis not present

## 2014-03-02 DIAGNOSIS — M79642 Pain in left hand: Secondary | ICD-10-CM | POA: Diagnosis not present

## 2014-03-02 DIAGNOSIS — G5612 Other lesions of median nerve, left upper limb: Secondary | ICD-10-CM | POA: Diagnosis not present

## 2014-03-02 DIAGNOSIS — G5611 Other lesions of median nerve, right upper limb: Secondary | ICD-10-CM | POA: Diagnosis not present

## 2014-03-02 DIAGNOSIS — M792 Neuralgia and neuritis, unspecified: Secondary | ICD-10-CM | POA: Diagnosis not present

## 2014-03-02 DIAGNOSIS — M79641 Pain in right hand: Secondary | ICD-10-CM | POA: Diagnosis not present

## 2014-03-11 DIAGNOSIS — Z6833 Body mass index (BMI) 33.0-33.9, adult: Secondary | ICD-10-CM | POA: Diagnosis not present

## 2014-03-11 DIAGNOSIS — J0100 Acute maxillary sinusitis, unspecified: Secondary | ICD-10-CM | POA: Diagnosis not present

## 2014-03-11 DIAGNOSIS — I10 Essential (primary) hypertension: Secondary | ICD-10-CM | POA: Diagnosis not present

## 2014-03-11 DIAGNOSIS — G4762 Sleep related leg cramps: Secondary | ICD-10-CM | POA: Diagnosis not present

## 2014-03-11 DIAGNOSIS — G4730 Sleep apnea, unspecified: Secondary | ICD-10-CM | POA: Diagnosis not present

## 2014-05-13 DIAGNOSIS — M150 Primary generalized (osteo)arthritis: Secondary | ICD-10-CM | POA: Diagnosis not present

## 2014-05-13 DIAGNOSIS — M542 Cervicalgia: Secondary | ICD-10-CM | POA: Diagnosis not present

## 2014-05-13 DIAGNOSIS — M47816 Spondylosis without myelopathy or radiculopathy, lumbar region: Secondary | ICD-10-CM | POA: Diagnosis not present

## 2014-05-13 DIAGNOSIS — M545 Low back pain: Secondary | ICD-10-CM | POA: Diagnosis not present

## 2014-08-04 DIAGNOSIS — M545 Low back pain: Secondary | ICD-10-CM | POA: Diagnosis not present

## 2014-08-04 DIAGNOSIS — M47816 Spondylosis without myelopathy or radiculopathy, lumbar region: Secondary | ICD-10-CM | POA: Diagnosis not present

## 2014-08-04 DIAGNOSIS — M150 Primary generalized (osteo)arthritis: Secondary | ICD-10-CM | POA: Diagnosis not present

## 2014-08-04 DIAGNOSIS — Z79899 Other long term (current) drug therapy: Secondary | ICD-10-CM | POA: Diagnosis not present

## 2014-08-04 DIAGNOSIS — Z5181 Encounter for therapeutic drug level monitoring: Secondary | ICD-10-CM | POA: Diagnosis not present

## 2014-08-04 DIAGNOSIS — M542 Cervicalgia: Secondary | ICD-10-CM | POA: Diagnosis not present

## 2014-08-11 DIAGNOSIS — Z8709 Personal history of other diseases of the respiratory system: Secondary | ICD-10-CM | POA: Diagnosis not present

## 2014-08-11 DIAGNOSIS — R0683 Snoring: Secondary | ICD-10-CM | POA: Diagnosis not present

## 2014-08-23 DIAGNOSIS — I10 Essential (primary) hypertension: Secondary | ICD-10-CM | POA: Diagnosis not present

## 2014-08-23 DIAGNOSIS — F419 Anxiety disorder, unspecified: Secondary | ICD-10-CM | POA: Diagnosis not present

## 2014-08-23 DIAGNOSIS — G894 Chronic pain syndrome: Secondary | ICD-10-CM | POA: Diagnosis not present

## 2014-08-23 DIAGNOSIS — Z6835 Body mass index (BMI) 35.0-35.9, adult: Secondary | ICD-10-CM | POA: Diagnosis not present

## 2014-08-23 DIAGNOSIS — E669 Obesity, unspecified: Secondary | ICD-10-CM | POA: Diagnosis not present

## 2014-08-23 DIAGNOSIS — F329 Major depressive disorder, single episode, unspecified: Secondary | ICD-10-CM | POA: Diagnosis not present

## 2014-08-23 DIAGNOSIS — E063 Autoimmune thyroiditis: Secondary | ICD-10-CM | POA: Diagnosis not present

## 2014-08-23 DIAGNOSIS — E6609 Other obesity due to excess calories: Secondary | ICD-10-CM | POA: Diagnosis not present

## 2014-09-13 DIAGNOSIS — K5792 Diverticulitis of intestine, part unspecified, without perforation or abscess without bleeding: Secondary | ICD-10-CM | POA: Diagnosis not present

## 2014-09-13 DIAGNOSIS — K589 Irritable bowel syndrome without diarrhea: Secondary | ICD-10-CM | POA: Diagnosis not present

## 2014-09-13 DIAGNOSIS — Z1231 Encounter for screening mammogram for malignant neoplasm of breast: Secondary | ICD-10-CM | POA: Diagnosis not present

## 2014-09-13 DIAGNOSIS — R1032 Left lower quadrant pain: Secondary | ICD-10-CM | POA: Diagnosis not present

## 2014-09-13 DIAGNOSIS — R1012 Left upper quadrant pain: Secondary | ICD-10-CM | POA: Diagnosis not present

## 2014-09-13 DIAGNOSIS — Z6833 Body mass index (BMI) 33.0-33.9, adult: Secondary | ICD-10-CM | POA: Diagnosis not present

## 2014-09-13 DIAGNOSIS — E6609 Other obesity due to excess calories: Secondary | ICD-10-CM | POA: Diagnosis not present

## 2014-09-13 DIAGNOSIS — K625 Hemorrhage of anus and rectum: Secondary | ICD-10-CM | POA: Diagnosis not present

## 2014-09-13 DIAGNOSIS — E063 Autoimmune thyroiditis: Secondary | ICD-10-CM | POA: Diagnosis not present

## 2014-09-20 DIAGNOSIS — D485 Neoplasm of uncertain behavior of skin: Secondary | ICD-10-CM | POA: Diagnosis not present

## 2014-09-20 DIAGNOSIS — D1800 Hemangioma unspecified site: Secondary | ICD-10-CM | POA: Diagnosis not present

## 2014-09-20 DIAGNOSIS — L570 Actinic keratosis: Secondary | ICD-10-CM | POA: Diagnosis not present

## 2014-09-20 DIAGNOSIS — D225 Melanocytic nevi of trunk: Secondary | ICD-10-CM | POA: Diagnosis not present

## 2014-09-21 DIAGNOSIS — Z9889 Other specified postprocedural states: Secondary | ICD-10-CM | POA: Diagnosis not present

## 2014-09-21 DIAGNOSIS — Z90710 Acquired absence of both cervix and uterus: Secondary | ICD-10-CM | POA: Diagnosis not present

## 2014-09-21 DIAGNOSIS — R112 Nausea with vomiting, unspecified: Secondary | ICD-10-CM | POA: Diagnosis not present

## 2014-09-21 DIAGNOSIS — R5381 Other malaise: Secondary | ICD-10-CM | POA: Diagnosis not present

## 2014-09-21 DIAGNOSIS — Z79899 Other long term (current) drug therapy: Secondary | ICD-10-CM | POA: Diagnosis not present

## 2014-09-21 DIAGNOSIS — R1110 Vomiting, unspecified: Secondary | ICD-10-CM | POA: Diagnosis not present

## 2014-09-21 DIAGNOSIS — R1032 Left lower quadrant pain: Secondary | ICD-10-CM | POA: Diagnosis not present

## 2014-09-21 DIAGNOSIS — K219 Gastro-esophageal reflux disease without esophagitis: Secondary | ICD-10-CM | POA: Diagnosis not present

## 2014-09-24 DIAGNOSIS — Z9049 Acquired absence of other specified parts of digestive tract: Secondary | ICD-10-CM | POA: Diagnosis not present

## 2014-09-24 DIAGNOSIS — Q6102 Congenital multiple renal cysts: Secondary | ICD-10-CM | POA: Diagnosis not present

## 2014-09-24 DIAGNOSIS — R1032 Left lower quadrant pain: Secondary | ICD-10-CM | POA: Diagnosis not present

## 2014-09-24 DIAGNOSIS — K7689 Other specified diseases of liver: Secondary | ICD-10-CM | POA: Diagnosis not present

## 2014-09-24 DIAGNOSIS — R1909 Other intra-abdominal and pelvic swelling, mass and lump: Secondary | ICD-10-CM | POA: Diagnosis not present

## 2014-10-14 DIAGNOSIS — K668 Other specified disorders of peritoneum: Secondary | ICD-10-CM | POA: Diagnosis not present

## 2014-10-14 DIAGNOSIS — D3A095 Benign carcinoid tumor of the midgut NOS: Secondary | ICD-10-CM | POA: Diagnosis not present

## 2014-10-14 DIAGNOSIS — K5909 Other constipation: Secondary | ICD-10-CM | POA: Diagnosis not present

## 2014-10-14 DIAGNOSIS — D3A098 Benign carcinoid tumors of other sites: Secondary | ICD-10-CM | POA: Diagnosis not present

## 2014-10-25 DIAGNOSIS — D3A00 Benign carcinoid tumor of unspecified site: Secondary | ICD-10-CM | POA: Diagnosis not present

## 2014-10-25 DIAGNOSIS — I38 Endocarditis, valve unspecified: Secondary | ICD-10-CM | POA: Diagnosis not present

## 2014-11-01 DIAGNOSIS — I517 Cardiomegaly: Secondary | ICD-10-CM | POA: Diagnosis not present

## 2014-11-01 DIAGNOSIS — K7689 Other specified diseases of liver: Secondary | ICD-10-CM | POA: Diagnosis not present

## 2014-11-01 DIAGNOSIS — J9811 Atelectasis: Secondary | ICD-10-CM | POA: Diagnosis not present

## 2014-11-01 DIAGNOSIS — I361 Nonrheumatic tricuspid (valve) insufficiency: Secondary | ICD-10-CM | POA: Diagnosis not present

## 2014-11-01 DIAGNOSIS — I272 Other secondary pulmonary hypertension: Secondary | ICD-10-CM | POA: Diagnosis not present

## 2014-11-01 DIAGNOSIS — K210 Gastro-esophageal reflux disease with esophagitis: Secondary | ICD-10-CM | POA: Diagnosis not present

## 2014-11-01 DIAGNOSIS — I2510 Atherosclerotic heart disease of native coronary artery without angina pectoris: Secondary | ICD-10-CM | POA: Diagnosis not present

## 2014-11-01 DIAGNOSIS — E538 Deficiency of other specified B group vitamins: Secondary | ICD-10-CM | POA: Diagnosis not present

## 2014-11-01 DIAGNOSIS — R590 Localized enlarged lymph nodes: Secondary | ICD-10-CM | POA: Diagnosis not present

## 2014-11-01 DIAGNOSIS — C787 Secondary malignant neoplasm of liver and intrahepatic bile duct: Secondary | ICD-10-CM | POA: Diagnosis not present

## 2014-11-01 DIAGNOSIS — I700 Atherosclerosis of aorta: Secondary | ICD-10-CM | POA: Diagnosis not present

## 2014-11-01 DIAGNOSIS — I351 Nonrheumatic aortic (valve) insufficiency: Secondary | ICD-10-CM | POA: Diagnosis not present

## 2014-11-01 DIAGNOSIS — I340 Nonrheumatic mitral (valve) insufficiency: Secondary | ICD-10-CM | POA: Diagnosis not present

## 2014-11-01 DIAGNOSIS — C7A098 Malignant carcinoid tumors of other sites: Secondary | ICD-10-CM | POA: Diagnosis not present

## 2014-11-01 DIAGNOSIS — I38 Endocarditis, valve unspecified: Secondary | ICD-10-CM | POA: Diagnosis not present

## 2014-11-01 DIAGNOSIS — I313 Pericardial effusion (noninflammatory): Secondary | ICD-10-CM | POA: Diagnosis not present

## 2014-11-01 DIAGNOSIS — K838 Other specified diseases of biliary tract: Secondary | ICD-10-CM | POA: Diagnosis not present

## 2014-11-01 DIAGNOSIS — D1803 Hemangioma of intra-abdominal structures: Secondary | ICD-10-CM | POA: Diagnosis not present

## 2014-11-01 DIAGNOSIS — N2889 Other specified disorders of kidney and ureter: Secondary | ICD-10-CM | POA: Diagnosis not present

## 2014-11-01 DIAGNOSIS — Z90710 Acquired absence of both cervix and uterus: Secondary | ICD-10-CM | POA: Diagnosis not present

## 2014-11-01 DIAGNOSIS — Z9049 Acquired absence of other specified parts of digestive tract: Secondary | ICD-10-CM | POA: Diagnosis not present

## 2014-11-01 DIAGNOSIS — N281 Cyst of kidney, acquired: Secondary | ICD-10-CM | POA: Diagnosis not present

## 2014-11-01 DIAGNOSIS — K869 Disease of pancreas, unspecified: Secondary | ICD-10-CM | POA: Diagnosis not present

## 2014-11-01 DIAGNOSIS — D3A8 Other benign neuroendocrine tumors: Secondary | ICD-10-CM | POA: Diagnosis not present

## 2014-11-01 DIAGNOSIS — D3A00 Benign carcinoid tumor of unspecified site: Secondary | ICD-10-CM | POA: Diagnosis not present

## 2014-11-01 DIAGNOSIS — K835 Biliary cyst: Secondary | ICD-10-CM | POA: Diagnosis not present

## 2014-11-09 DIAGNOSIS — Z79899 Other long term (current) drug therapy: Secondary | ICD-10-CM | POA: Diagnosis not present

## 2014-11-09 DIAGNOSIS — D510 Vitamin B12 deficiency anemia due to intrinsic factor deficiency: Secondary | ICD-10-CM | POA: Diagnosis not present

## 2014-11-09 DIAGNOSIS — D3A8 Other benign neuroendocrine tumors: Secondary | ICD-10-CM | POA: Diagnosis not present

## 2014-11-09 DIAGNOSIS — K769 Liver disease, unspecified: Secondary | ICD-10-CM | POA: Diagnosis not present

## 2014-11-10 DIAGNOSIS — D3A00 Benign carcinoid tumor of unspecified site: Secondary | ICD-10-CM | POA: Diagnosis not present

## 2014-11-10 DIAGNOSIS — C787 Secondary malignant neoplasm of liver and intrahepatic bile duct: Secondary | ICD-10-CM | POA: Diagnosis not present

## 2014-11-10 DIAGNOSIS — Z0389 Encounter for observation for other suspected diseases and conditions ruled out: Secondary | ICD-10-CM | POA: Diagnosis not present

## 2014-11-10 DIAGNOSIS — R590 Localized enlarged lymph nodes: Secondary | ICD-10-CM | POA: Diagnosis not present

## 2014-11-10 DIAGNOSIS — R935 Abnormal findings on diagnostic imaging of other abdominal regions, including retroperitoneum: Secondary | ICD-10-CM | POA: Diagnosis not present

## 2014-11-11 DIAGNOSIS — M150 Primary generalized (osteo)arthritis: Secondary | ICD-10-CM | POA: Diagnosis not present

## 2014-11-11 DIAGNOSIS — M47816 Spondylosis without myelopathy or radiculopathy, lumbar region: Secondary | ICD-10-CM | POA: Diagnosis not present

## 2014-11-11 DIAGNOSIS — R1084 Generalized abdominal pain: Secondary | ICD-10-CM | POA: Diagnosis not present

## 2014-11-11 DIAGNOSIS — M545 Low back pain: Secondary | ICD-10-CM | POA: Diagnosis not present

## 2014-11-12 DIAGNOSIS — C787 Secondary malignant neoplasm of liver and intrahepatic bile duct: Secondary | ICD-10-CM | POA: Diagnosis not present

## 2014-11-12 DIAGNOSIS — C7B8 Other secondary neuroendocrine tumors: Secondary | ICD-10-CM | POA: Diagnosis not present

## 2014-11-12 DIAGNOSIS — C801 Malignant (primary) neoplasm, unspecified: Secondary | ICD-10-CM | POA: Diagnosis not present

## 2014-11-12 DIAGNOSIS — D3A00 Benign carcinoid tumor of unspecified site: Secondary | ICD-10-CM | POA: Diagnosis not present

## 2014-11-15 DIAGNOSIS — D3A8 Other benign neuroendocrine tumors: Secondary | ICD-10-CM | POA: Diagnosis not present

## 2014-11-15 DIAGNOSIS — C787 Secondary malignant neoplasm of liver and intrahepatic bile duct: Secondary | ICD-10-CM | POA: Diagnosis not present

## 2014-11-15 DIAGNOSIS — Z5111 Encounter for antineoplastic chemotherapy: Secondary | ICD-10-CM | POA: Diagnosis not present

## 2014-11-15 DIAGNOSIS — D3A00 Benign carcinoid tumor of unspecified site: Secondary | ICD-10-CM | POA: Diagnosis not present

## 2014-11-19 DIAGNOSIS — I2510 Atherosclerotic heart disease of native coronary artery without angina pectoris: Secondary | ICD-10-CM | POA: Diagnosis not present

## 2014-11-19 DIAGNOSIS — D3A8 Other benign neuroendocrine tumors: Secondary | ICD-10-CM | POA: Diagnosis not present

## 2014-11-19 DIAGNOSIS — K7689 Other specified diseases of liver: Secondary | ICD-10-CM | POA: Diagnosis not present

## 2014-11-19 DIAGNOSIS — D1803 Hemangioma of intra-abdominal structures: Secondary | ICD-10-CM | POA: Diagnosis not present

## 2014-11-19 DIAGNOSIS — Z9049 Acquired absence of other specified parts of digestive tract: Secondary | ICD-10-CM | POA: Diagnosis not present

## 2014-11-19 DIAGNOSIS — N2889 Other specified disorders of kidney and ureter: Secondary | ICD-10-CM | POA: Diagnosis not present

## 2014-11-19 DIAGNOSIS — C787 Secondary malignant neoplasm of liver and intrahepatic bile duct: Secondary | ICD-10-CM | POA: Diagnosis not present

## 2014-11-19 DIAGNOSIS — C7A098 Malignant carcinoid tumors of other sites: Secondary | ICD-10-CM | POA: Diagnosis not present

## 2014-11-19 DIAGNOSIS — K6389 Other specified diseases of intestine: Secondary | ICD-10-CM | POA: Diagnosis not present

## 2014-11-19 DIAGNOSIS — I700 Atherosclerosis of aorta: Secondary | ICD-10-CM | POA: Diagnosis not present

## 2014-11-19 DIAGNOSIS — Z90710 Acquired absence of both cervix and uterus: Secondary | ICD-10-CM | POA: Diagnosis not present

## 2014-11-22 DIAGNOSIS — E782 Mixed hyperlipidemia: Secondary | ICD-10-CM | POA: Diagnosis not present

## 2014-11-22 DIAGNOSIS — Z6835 Body mass index (BMI) 35.0-35.9, adult: Secondary | ICD-10-CM | POA: Diagnosis not present

## 2014-11-22 DIAGNOSIS — K219 Gastro-esophageal reflux disease without esophagitis: Secondary | ICD-10-CM | POA: Diagnosis not present

## 2014-11-22 DIAGNOSIS — F419 Anxiety disorder, unspecified: Secondary | ICD-10-CM | POA: Diagnosis not present

## 2014-11-22 DIAGNOSIS — G894 Chronic pain syndrome: Secondary | ICD-10-CM | POA: Diagnosis not present

## 2014-11-22 DIAGNOSIS — I10 Essential (primary) hypertension: Secondary | ICD-10-CM | POA: Diagnosis not present

## 2014-11-22 DIAGNOSIS — E669 Obesity, unspecified: Secondary | ICD-10-CM | POA: Diagnosis not present

## 2014-11-22 DIAGNOSIS — Z1389 Encounter for screening for other disorder: Secondary | ICD-10-CM | POA: Diagnosis not present

## 2014-11-22 DIAGNOSIS — C7A1 Malignant poorly differentiated neuroendocrine tumors: Secondary | ICD-10-CM | POA: Diagnosis not present

## 2014-11-25 DIAGNOSIS — R938 Abnormal findings on diagnostic imaging of other specified body structures: Secondary | ICD-10-CM | POA: Diagnosis not present

## 2014-11-25 DIAGNOSIS — K644 Residual hemorrhoidal skin tags: Secondary | ICD-10-CM | POA: Diagnosis not present

## 2014-11-25 DIAGNOSIS — Z888 Allergy status to other drugs, medicaments and biological substances status: Secondary | ICD-10-CM | POA: Diagnosis not present

## 2014-11-25 DIAGNOSIS — Z981 Arthrodesis status: Secondary | ICD-10-CM | POA: Diagnosis not present

## 2014-11-25 DIAGNOSIS — I10 Essential (primary) hypertension: Secondary | ICD-10-CM | POA: Diagnosis not present

## 2014-11-25 DIAGNOSIS — K317 Polyp of stomach and duodenum: Secondary | ICD-10-CM | POA: Diagnosis not present

## 2014-11-25 DIAGNOSIS — K219 Gastro-esophageal reflux disease without esophagitis: Secondary | ICD-10-CM | POA: Diagnosis not present

## 2014-11-25 DIAGNOSIS — D3A8 Other benign neuroendocrine tumors: Secondary | ICD-10-CM | POA: Diagnosis not present

## 2014-11-25 DIAGNOSIS — G4730 Sleep apnea, unspecified: Secondary | ICD-10-CM | POA: Diagnosis not present

## 2014-11-25 DIAGNOSIS — K921 Melena: Secondary | ICD-10-CM | POA: Diagnosis not present

## 2014-11-25 DIAGNOSIS — D649 Anemia, unspecified: Secondary | ICD-10-CM | POA: Diagnosis not present

## 2014-11-25 DIAGNOSIS — Z1211 Encounter for screening for malignant neoplasm of colon: Secondary | ICD-10-CM | POA: Diagnosis not present

## 2014-11-25 DIAGNOSIS — K589 Irritable bowel syndrome without diarrhea: Secondary | ICD-10-CM | POA: Diagnosis not present

## 2014-11-25 DIAGNOSIS — E079 Disorder of thyroid, unspecified: Secondary | ICD-10-CM | POA: Diagnosis not present

## 2014-11-25 DIAGNOSIS — D175 Benign lipomatous neoplasm of intra-abdominal organs: Secondary | ICD-10-CM | POA: Diagnosis not present

## 2014-11-25 DIAGNOSIS — K648 Other hemorrhoids: Secondary | ICD-10-CM | POA: Diagnosis not present

## 2014-11-25 DIAGNOSIS — Z800 Family history of malignant neoplasm of digestive organs: Secondary | ICD-10-CM | POA: Diagnosis not present

## 2014-11-25 DIAGNOSIS — Z79899 Other long term (current) drug therapy: Secondary | ICD-10-CM | POA: Diagnosis not present

## 2014-11-25 DIAGNOSIS — K5900 Constipation, unspecified: Secondary | ICD-10-CM | POA: Diagnosis not present

## 2014-11-25 DIAGNOSIS — Z882 Allergy status to sulfonamides status: Secondary | ICD-10-CM | POA: Diagnosis not present

## 2014-11-25 DIAGNOSIS — C7A8 Other malignant neuroendocrine tumors: Secondary | ICD-10-CM | POA: Diagnosis not present

## 2014-12-20 DIAGNOSIS — D3A00 Benign carcinoid tumor of unspecified site: Secondary | ICD-10-CM | POA: Diagnosis not present

## 2014-12-20 DIAGNOSIS — Z5111 Encounter for antineoplastic chemotherapy: Secondary | ICD-10-CM | POA: Diagnosis not present

## 2014-12-23 DIAGNOSIS — E340 Carcinoid syndrome: Secondary | ICD-10-CM | POA: Diagnosis not present

## 2014-12-23 DIAGNOSIS — C7A1 Malignant poorly differentiated neuroendocrine tumors: Secondary | ICD-10-CM | POA: Diagnosis not present

## 2014-12-23 DIAGNOSIS — R1012 Left upper quadrant pain: Secondary | ICD-10-CM | POA: Diagnosis not present

## 2014-12-31 ENCOUNTER — Telehealth: Payer: Self-pay | Admitting: Cardiology

## 2014-12-31 NOTE — Telephone Encounter (Signed)
12/31/14 - Diagnosed with rare cancer - once she can get undercontrol she will call back to schedule -srs

## 2015-01-24 DIAGNOSIS — Z79899 Other long term (current) drug therapy: Secondary | ICD-10-CM | POA: Diagnosis not present

## 2015-01-24 DIAGNOSIS — D3A00 Benign carcinoid tumor of unspecified site: Secondary | ICD-10-CM | POA: Diagnosis not present

## 2015-01-24 DIAGNOSIS — Z5111 Encounter for antineoplastic chemotherapy: Secondary | ICD-10-CM | POA: Diagnosis not present

## 2015-01-24 DIAGNOSIS — C7B02 Secondary carcinoid tumors of liver: Secondary | ICD-10-CM | POA: Diagnosis not present

## 2015-02-11 DIAGNOSIS — M545 Low back pain: Secondary | ICD-10-CM | POA: Diagnosis not present

## 2015-02-11 DIAGNOSIS — R1084 Generalized abdominal pain: Secondary | ICD-10-CM | POA: Diagnosis not present

## 2015-02-11 DIAGNOSIS — Z5181 Encounter for therapeutic drug level monitoring: Secondary | ICD-10-CM | POA: Diagnosis not present

## 2015-02-11 DIAGNOSIS — M150 Primary generalized (osteo)arthritis: Secondary | ICD-10-CM | POA: Diagnosis not present

## 2015-02-11 DIAGNOSIS — G8929 Other chronic pain: Secondary | ICD-10-CM | POA: Diagnosis not present

## 2015-02-11 DIAGNOSIS — Z79899 Other long term (current) drug therapy: Secondary | ICD-10-CM | POA: Diagnosis not present

## 2015-02-11 DIAGNOSIS — M47816 Spondylosis without myelopathy or radiculopathy, lumbar region: Secondary | ICD-10-CM | POA: Diagnosis not present

## 2015-02-16 DIAGNOSIS — M19011 Primary osteoarthritis, right shoulder: Secondary | ICD-10-CM | POA: Diagnosis not present

## 2015-02-16 DIAGNOSIS — M19012 Primary osteoarthritis, left shoulder: Secondary | ICD-10-CM | POA: Diagnosis not present

## 2015-02-21 DIAGNOSIS — L928 Other granulomatous disorders of the skin and subcutaneous tissue: Secondary | ICD-10-CM | POA: Diagnosis not present

## 2015-02-21 DIAGNOSIS — Z79899 Other long term (current) drug therapy: Secondary | ICD-10-CM | POA: Diagnosis not present

## 2015-02-21 DIAGNOSIS — R590 Localized enlarged lymph nodes: Secondary | ICD-10-CM | POA: Diagnosis not present

## 2015-02-21 DIAGNOSIS — Z96641 Presence of right artificial hip joint: Secondary | ICD-10-CM | POA: Diagnosis not present

## 2015-02-21 DIAGNOSIS — Z0389 Encounter for observation for other suspected diseases and conditions ruled out: Secondary | ICD-10-CM | POA: Diagnosis not present

## 2015-02-21 DIAGNOSIS — C7B02 Secondary carcinoid tumors of liver: Secondary | ICD-10-CM | POA: Diagnosis not present

## 2015-02-21 DIAGNOSIS — Z5111 Encounter for antineoplastic chemotherapy: Secondary | ICD-10-CM | POA: Diagnosis not present

## 2015-02-21 DIAGNOSIS — J9811 Atelectasis: Secondary | ICD-10-CM | POA: Diagnosis not present

## 2015-02-21 DIAGNOSIS — I700 Atherosclerosis of aorta: Secondary | ICD-10-CM | POA: Diagnosis not present

## 2015-02-21 DIAGNOSIS — Z981 Arthrodesis status: Secondary | ICD-10-CM | POA: Diagnosis not present

## 2015-02-21 DIAGNOSIS — R935 Abnormal findings on diagnostic imaging of other abdominal regions, including retroperitoneum: Secondary | ICD-10-CM | POA: Diagnosis not present

## 2015-02-21 DIAGNOSIS — D3A00 Benign carcinoid tumor of unspecified site: Secondary | ICD-10-CM | POA: Diagnosis not present

## 2015-02-21 DIAGNOSIS — Z90710 Acquired absence of both cervix and uterus: Secondary | ICD-10-CM | POA: Diagnosis not present

## 2015-02-21 DIAGNOSIS — N2889 Other specified disorders of kidney and ureter: Secondary | ICD-10-CM | POA: Diagnosis not present

## 2015-02-21 DIAGNOSIS — M1612 Unilateral primary osteoarthritis, left hip: Secondary | ICD-10-CM | POA: Diagnosis not present

## 2015-02-21 DIAGNOSIS — K838 Other specified diseases of biliary tract: Secondary | ICD-10-CM | POA: Diagnosis not present

## 2015-03-01 DIAGNOSIS — H2513 Age-related nuclear cataract, bilateral: Secondary | ICD-10-CM | POA: Diagnosis not present

## 2015-03-01 DIAGNOSIS — H2511 Age-related nuclear cataract, right eye: Secondary | ICD-10-CM | POA: Diagnosis not present

## 2015-03-02 DIAGNOSIS — M79604 Pain in right leg: Secondary | ICD-10-CM | POA: Diagnosis not present

## 2015-03-02 DIAGNOSIS — M79605 Pain in left leg: Secondary | ICD-10-CM | POA: Diagnosis not present

## 2015-03-02 DIAGNOSIS — M5137 Other intervertebral disc degeneration, lumbosacral region: Secondary | ICD-10-CM | POA: Diagnosis not present

## 2015-03-02 DIAGNOSIS — G894 Chronic pain syndrome: Secondary | ICD-10-CM | POA: Diagnosis not present

## 2015-03-02 DIAGNOSIS — M545 Low back pain: Secondary | ICD-10-CM | POA: Diagnosis not present

## 2015-03-07 DIAGNOSIS — R590 Localized enlarged lymph nodes: Secondary | ICD-10-CM | POA: Diagnosis not present

## 2015-03-07 DIAGNOSIS — C7A011 Malignant carcinoid tumor of the jejunum: Secondary | ICD-10-CM | POA: Diagnosis not present

## 2015-03-07 DIAGNOSIS — D3A00 Benign carcinoid tumor of unspecified site: Secondary | ICD-10-CM | POA: Diagnosis not present

## 2015-03-07 DIAGNOSIS — K7689 Other specified diseases of liver: Secondary | ICD-10-CM | POA: Diagnosis not present

## 2015-03-08 DIAGNOSIS — D3A00 Benign carcinoid tumor of unspecified site: Secondary | ICD-10-CM | POA: Diagnosis not present

## 2015-03-08 DIAGNOSIS — K7689 Other specified diseases of liver: Secondary | ICD-10-CM | POA: Diagnosis not present

## 2015-03-08 DIAGNOSIS — C7A011 Malignant carcinoid tumor of the jejunum: Secondary | ICD-10-CM | POA: Diagnosis not present

## 2015-03-15 DIAGNOSIS — R1032 Left lower quadrant pain: Secondary | ICD-10-CM | POA: Diagnosis not present

## 2015-03-21 DIAGNOSIS — C7A00 Malignant carcinoid tumor of unspecified site: Secondary | ICD-10-CM | POA: Diagnosis not present

## 2015-03-21 DIAGNOSIS — C7B02 Secondary carcinoid tumors of liver: Secondary | ICD-10-CM | POA: Diagnosis not present

## 2015-03-21 DIAGNOSIS — D3A00 Benign carcinoid tumor of unspecified site: Secondary | ICD-10-CM | POA: Diagnosis not present

## 2015-03-21 DIAGNOSIS — Z79899 Other long term (current) drug therapy: Secondary | ICD-10-CM | POA: Diagnosis not present

## 2015-03-21 DIAGNOSIS — Z5111 Encounter for antineoplastic chemotherapy: Secondary | ICD-10-CM | POA: Diagnosis not present

## 2015-04-01 NOTE — Patient Instructions (Signed)
Your procedure is scheduled on: 04/12/2015  Report to [facility] at  70  AM.  Call this number if you have problems the morning of surgery: [phone]   Do not eat food or drink liquids :After Midnight.      Take these medicines the morning of surgery with A SIP OF WATER: xanax, norvasc, lexapro, nexium, dilaudid, levothyroxine, lisinopril.   Do not wear jewelry, make-up or nail polish.  Do not wear lotions, powders, or perfumes. You may wear deodorant.  Do not shave 48 hours prior to surgery.  Do not bring valuables to the hospital.  Contacts, dentures or bridgework may not be worn into surgery.  Leave suitcase in the car. After surgery it may be brought to your room.  For patients admitted to the hospital, checkout time is 11:00 AM the day of discharge.   Patients discharged the day of surgery will not be allowed to drive home.  :     Please read over the following fact sheets that you were given: Coughing and Deep Breathing, Surgical Site Infection Prevention, Anesthesia Post-op Instructions and Care and Recovery After Surgery    Cataract A cataract is a clouding of the lens of the eye. When a lens becomes cloudy, vision is reduced based on the degree and nature of the clouding. Many cataracts reduce vision to some degree. Some cataracts make people more near-sighted as they develop. Other cataracts increase glare. Cataracts that are ignored and become worse can sometimes look white. The white color can be seen through the pupil. CAUSES   Aging. However, cataracts may occur at any age, even in newborns.   Certain drugs.   Trauma to the eye.   Certain diseases such as diabetes.   Specific eye diseases such as chronic inflammation inside the eye or a sudden attack of a rare form of glaucoma.   Inherited or acquired medical problems.  SYMPTOMS   Gradual, progressive drop in vision in the affected eye.   Severe, rapid visual loss. This most often happens when trauma is the  cause.  DIAGNOSIS  To detect a cataract, an eye doctor examines the lens. Cataracts are best diagnosed with an exam of the eyes with the pupils enlarged (dilated) by drops.  TREATMENT  For an early cataract, vision may improve by using different eyeglasses or stronger lighting. If that does not help your vision, surgery is the only effective treatment. A cataract needs to be surgically removed when vision loss interferes with your everyday activities, such as driving, reading, or watching TV. A cataract may also have to be removed if it prevents examination or treatment of another eye problem. Surgery removes the cloudy lens and usually replaces it with a substitute lens (intraocular lens, IOL).  At a time when both you and your doctor agree, the cataract will be surgically removed. If you have cataracts in both eyes, only one is usually removed at a time. This allows the operated eye to heal and be out of danger from any possible problems after surgery (such as infection or poor wound healing). In rare cases, a cataract may be doing damage to your eye. In these cases, your caregiver may advise surgical removal right away. The vast majority of people who have cataract surgery have better vision afterward. HOME CARE INSTRUCTIONS  If you are not planning surgery, you may be asked to do the following:  Use different eyeglasses.   Use stronger or brighter lighting.   Ask your eye doctor  about reducing your medicine dose or changing medicines if it is thought that a medicine caused your cataract. Changing medicines does not make the cataract go away on its own.   Become familiar with your surroundings. Poor vision can lead to injury. Avoid bumping into things on the affected side. You are at a higher risk for tripping or falling.   Exercise extreme care when driving or operating machinery.   Wear sunglasses if you are sensitive to bright light or experiencing problems with glare.  SEEK IMMEDIATE  MEDICAL CARE IF:   You have a worsening or sudden vision loss.   You notice redness, swelling, or increasing pain in the eye.   You have a fever.  Document Released: 03/19/2005 Document Revised: 03/08/2011 Document Reviewed: 11/10/2010 [facility] Patient Information 2012 [place].PATIENT INSTRUCTIONS POST-ANESTHESIA  IMMEDIATELY FOLLOWING SURGERY:  Do not drive or operate machinery for the first twenty four hours after surgery.  Do not make any important decisions for twenty four hours after surgery or while taking narcotic pain medications or sedatives.  If you develop intractable nausea and vomiting or a severe headache please notify your doctor immediately.  FOLLOW-UP:  Please make an appointment with your surgeon as instructed. You do not need to follow up with anesthesia unless specifically instructed to do so.  WOUND CARE INSTRUCTIONS (if applicable):  Keep a dry clean dressing on the anesthesia/puncture wound site if there is drainage.  Once the wound has quit draining you may leave it open to air.  Generally you should leave the bandage intact for twenty four hours unless there is drainage.  If the epidural site drains for more than 36-48 hours please call the anesthesia department.  QUESTIONS?:  Please feel free to call your physician or the hospital operator if you have any questions, and they will be happy to assist you.

## 2015-04-05 ENCOUNTER — Encounter (HOSPITAL_COMMUNITY)
Admission: RE | Admit: 2015-04-05 | Discharge: 2015-04-05 | Disposition: A | Discharge status: Home / Self Care | Payer: Medicare Other | Source: Ambulatory Visit | Attending: Ophthalmology | Admitting: Ophthalmology

## 2015-04-05 ENCOUNTER — Encounter (HOSPITAL_COMMUNITY): Payer: Self-pay

## 2015-04-05 DIAGNOSIS — Z01818 Encounter for other preprocedural examination: Secondary | ICD-10-CM | POA: Diagnosis not present

## 2015-04-05 DIAGNOSIS — H2511 Age-related nuclear cataract, right eye: Secondary | ICD-10-CM | POA: Diagnosis not present

## 2015-04-05 HISTORY — DX: Polyneuropathy, unspecified: G62.9

## 2015-04-05 HISTORY — DX: Pure hypercholesterolemia, unspecified: E78.00

## 2015-04-05 LAB — BASIC METABOLIC PANEL
Anion gap: 6 (ref 5–15)
BUN: 13 mg/dL (ref 6–20)
CALCIUM: 8.7 mg/dL — AB (ref 8.9–10.3)
CHLORIDE: 109 mmol/L (ref 101–111)
CO2: 26 mmol/L (ref 22–32)
CREATININE: 0.61 mg/dL (ref 0.44–1.00)
GFR calc Af Amer: >60 mL/min (ref >60)
GFR calc non Af Amer: >60 mL/min (ref >60)
GLUCOSE: 112 mg/dL — AB (ref 65–99)
Potassium: 4.2 mmol/L (ref 3.5–5.1)
Sodium: 141 mmol/L (ref 135–145)

## 2015-04-05 LAB — CBC WITH DIFFERENTIAL/PLATELET
Basophils Absolute: 0.1 10*3/uL (ref 0.0–0.1)
Basophils Relative: 2 %
Eosinophils Absolute: 0.1 10*3/uL (ref 0.0–0.7)
Eosinophils Relative: 2 %
HEMATOCRIT: 38.4 % (ref 36.0–46.0)
HEMOGLOBIN: 12 g/dL (ref 12.0–15.0)
LYMPHS ABS: 1.5 10*3/uL (ref 0.7–4.0)
Lymphocytes Relative: 28 %
MCH: 29.1 pg (ref 26.0–34.0)
MCHC: 31.3 g/dL (ref 30.0–36.0)
MCV: 93 fL (ref 78.0–100.0)
MONO ABS: 0.3 10*3/uL (ref 0.1–1.0)
MONOS PCT: 6 %
NEUTROS ABS: 3.4 10*3/uL (ref 1.7–7.7)
NEUTROS PCT: 62 %
Platelets: 310 10*3/uL (ref 150–400)
RBC: 4.13 MIL/uL (ref 3.87–5.11)
RDW: 14.5 % (ref 11.5–15.5)
WBC: 5.4 10*3/uL (ref 4.0–10.5)

## 2015-04-05 LAB — SURGICAL PCR SCREEN
MRSA, PCR: NEGATIVE
Staphylococcus aureus: NEGATIVE

## 2015-04-05 NOTE — Pre-Procedure Instructions (Signed)
Patient given information to sign up for my chart at home. 

## 2015-04-11 MED ORDER — CYCLOPENTOLATE-PHENYLEPHRINE OP SOLN OPTIME - NO CHARGE
OPHTHALMIC | Status: AC
  Filled 2015-04-11: qty 2

## 2015-04-11 MED ORDER — KETOROLAC TROMETHAMINE 0.5 % OP SOLN
OPHTHALMIC | Status: AC
  Filled 2015-04-11: qty 5

## 2015-04-11 MED ORDER — TETRACAINE HCL 0.5 % OP SOLN
OPHTHALMIC | Status: AC
  Filled 2015-04-11: qty 4

## 2015-04-11 MED ORDER — PHENYLEPHRINE HCL 2.5 % OP SOLN
OPHTHALMIC | Status: AC
  Filled 2015-04-11: qty 15

## 2015-04-12 ENCOUNTER — Ambulatory Visit (HOSPITAL_COMMUNITY)
Admission: RE | Admit: 2015-04-12 | Discharge: 2015-04-12 | Disposition: A | Discharge status: Home / Self Care | Payer: Medicare Other | Source: Ambulatory Visit | Attending: Ophthalmology | Admitting: Ophthalmology

## 2015-04-12 ENCOUNTER — Ambulatory Visit (HOSPITAL_COMMUNITY): Payer: Medicare Other | Admitting: Anesthesiology

## 2015-04-12 ENCOUNTER — Encounter (HOSPITAL_COMMUNITY)
Admission: RE | Disposition: A | Discharge status: Home / Self Care | Payer: Self-pay | Source: Ambulatory Visit | Attending: Ophthalmology

## 2015-04-12 DIAGNOSIS — Z8249 Family history of ischemic heart disease and other diseases of the circulatory system: Secondary | ICD-10-CM | POA: Diagnosis not present

## 2015-04-12 DIAGNOSIS — H2511 Age-related nuclear cataract, right eye: Secondary | ICD-10-CM | POA: Insufficient documentation

## 2015-04-12 DIAGNOSIS — I10 Essential (primary) hypertension: Secondary | ICD-10-CM | POA: Diagnosis not present

## 2015-04-12 DIAGNOSIS — I509 Heart failure, unspecified: Secondary | ICD-10-CM | POA: Insufficient documentation

## 2015-04-12 DIAGNOSIS — K219 Gastro-esophageal reflux disease without esophagitis: Secondary | ICD-10-CM | POA: Diagnosis not present

## 2015-04-12 DIAGNOSIS — H2512 Age-related nuclear cataract, left eye: Secondary | ICD-10-CM | POA: Diagnosis not present

## 2015-04-12 DIAGNOSIS — E7800 Pure hypercholesterolemia, unspecified: Secondary | ICD-10-CM | POA: Diagnosis not present

## 2015-04-12 DIAGNOSIS — H269 Unspecified cataract: Secondary | ICD-10-CM | POA: Diagnosis not present

## 2015-04-12 DIAGNOSIS — M545 Low back pain: Secondary | ICD-10-CM | POA: Diagnosis not present

## 2015-04-12 DIAGNOSIS — G8929 Other chronic pain: Secondary | ICD-10-CM | POA: Diagnosis not present

## 2015-04-12 DIAGNOSIS — E039 Hypothyroidism, unspecified: Secondary | ICD-10-CM | POA: Insufficient documentation

## 2015-04-12 DIAGNOSIS — Z8542 Personal history of malignant neoplasm of other parts of uterus: Secondary | ICD-10-CM | POA: Insufficient documentation

## 2015-04-12 DIAGNOSIS — F419 Anxiety disorder, unspecified: Secondary | ICD-10-CM | POA: Insufficient documentation

## 2015-04-12 DIAGNOSIS — Z79899 Other long term (current) drug therapy: Secondary | ICD-10-CM | POA: Insufficient documentation

## 2015-04-12 DIAGNOSIS — Z90710 Acquired absence of both cervix and uterus: Secondary | ICD-10-CM | POA: Diagnosis not present

## 2015-04-12 HISTORY — PX: CATARACT EXTRACTION W/PHACO: SHX586

## 2015-04-12 SURGERY — PHACOEMULSIFICATION, CATARACT, WITH IOL INSERTION
Anesthesia: Monitor Anesthesia Care | Site: Eye | Laterality: Right

## 2015-04-12 MED ORDER — TETRACAINE 0.5 % OP SOLN OPTIME - NO CHARGE
OPHTHALMIC | Status: DC | PRN
  Administered 2015-04-12: 1 [drp] via OPHTHALMIC

## 2015-04-12 MED ORDER — BSS IO SOLN
INTRAOCULAR | Status: DC | PRN
  Administered 2015-04-12: 15 mL

## 2015-04-12 MED ORDER — PROVISC 10 MG/ML IO SOLN
INTRAOCULAR | Status: DC | PRN
  Administered 2015-04-12: 0.85 mL via INTRAOCULAR

## 2015-04-12 MED ORDER — CYCLOPENTOLATE-PHENYLEPHRINE 0.2-1 % OP SOLN
1.0000 [drp] | OPHTHALMIC | Status: AC
  Administered 2015-04-12 (×3): 1 [drp] via OPHTHALMIC

## 2015-04-12 MED ORDER — EPINEPHRINE HCL 1 MG/ML IJ SOLN
INTRAOCULAR | Status: DC | PRN
  Administered 2015-04-12: 500 mL

## 2015-04-12 MED ORDER — EPINEPHRINE HCL 1 MG/ML IJ SOLN
INTRAMUSCULAR | Status: AC
  Filled 2015-04-12: qty 1

## 2015-04-12 MED ORDER — FENTANYL CITRATE (PF) 100 MCG/2ML IJ SOLN
INTRAMUSCULAR | Status: AC
  Filled 2015-04-12: qty 2

## 2015-04-12 MED ORDER — FENTANYL CITRATE (PF) 100 MCG/2ML IJ SOLN
25.0000 ug | INTRAMUSCULAR | Status: AC
  Administered 2015-04-12 (×2): 25 ug via INTRAVENOUS

## 2015-04-12 MED ORDER — MIDAZOLAM HCL 2 MG/2ML IJ SOLN: 1.0000 mg | INTRAMUSCULAR | Status: DC | PRN

## 2015-04-12 MED ORDER — KETOROLAC TROMETHAMINE 0.5 % OP SOLN
1.0000 [drp] | OPHTHALMIC | Status: AC
  Administered 2015-04-12 (×3): 1 [drp] via OPHTHALMIC

## 2015-04-12 MED ORDER — LACTATED RINGERS IV SOLN
INTRAVENOUS | Status: DC
  Administered 2015-04-12: 09:00:00 via INTRAVENOUS

## 2015-04-12 MED ORDER — TETRACAINE HCL 0.5 % OP SOLN
1.0000 [drp] | OPHTHALMIC | Status: AC
  Administered 2015-04-12 (×3): 1 [drp] via OPHTHALMIC

## 2015-04-12 MED ORDER — MIDAZOLAM HCL 2 MG/2ML IJ SOLN
INTRAMUSCULAR | Status: AC
  Filled 2015-04-12: qty 2

## 2015-04-12 MED ORDER — PHENYLEPHRINE HCL 2.5 % OP SOLN
1.0000 [drp] | OPHTHALMIC | Status: AC
  Administered 2015-04-12 (×3): 1 [drp] via OPHTHALMIC

## 2015-04-12 SURGICAL SUPPLY — 11 items
CLOTH BEACON ORANGE TIMEOUT ST (SAFETY) ×3 IMPLANT
EYE SHIELD UNIVERSAL CLEAR (GAUZE/BANDAGES/DRESSINGS) ×3 IMPLANT
GLOVE BIOGEL PI IND STRL 6.5 (GLOVE) ×1 IMPLANT
GLOVE BIOGEL PI IND STRL 7.0 (GLOVE) ×1 IMPLANT
GLOVE BIOGEL PI INDICATOR 6.5 (GLOVE) ×2
GLOVE BIOGEL PI INDICATOR 7.0 (GLOVE) ×2
LENS ALC ACRYL/TECN (Ophthalmic Related) ×3 IMPLANT
PAD ARMBOARD 7.5X6 YLW CONV (MISCELLANEOUS) ×3 IMPLANT
TAPE SURG TRANSPORE 1 IN (GAUZE/BANDAGES/DRESSINGS) ×1 IMPLANT
TAPE SURGICAL TRANSPORE 1 IN (GAUZE/BANDAGES/DRESSINGS) ×2
WATER STERILE IRR 250ML POUR (IV SOLUTION) ×3 IMPLANT

## 2015-04-12 NOTE — Anesthesia Postprocedure Evaluation (Signed)
Anesthesia Post Note  Patient: Margaret Cowan  Procedure(s) Performed: Procedure(s) (LRB): CATARACT EXTRACTION PHACO AND INTRAOCULAR LENS PLACEMENT (IOC) (Right)  Patient location during evaluation: Short Stay Anesthesia Type: MAC Level of consciousness: awake and alert and oriented Pain management: pain level controlled Vital Signs Assessment: post-procedure vital signs reviewed and stable Respiratory status: respiratory function stable Cardiovascular status: stable Postop Assessment: no signs of nausea or vomiting Anesthetic complications: no    Last Vitals:  Filed Vitals:   04/12/15 0849 04/12/15 0916  BP: 125/68 115/78  Pulse: 54 51  Temp: 36.7 C   Resp: 18 18    Last Pain: There were no vitals filed for this visit.               ADAMS, AMY A

## 2015-04-12 NOTE — Transfer of Care (Signed)
Immediate Anesthesia Transfer of Care Note  Patient: Margaret Cowan  Procedure(s) Performed: Procedure(s) with comments: CATARACT EXTRACTION PHACO AND INTRAOCULAR LENS PLACEMENT (IOC) (Right) - CDE:7.02  Patient Location: Short Stay  Anesthesia Type:MAC  Level of Consciousness: awake, alert , oriented and patient cooperative  Airway & Oxygen Therapy: Patient Spontanous Breathing  Post-op Assessment: Report given to RN and Post -op Vital signs reviewed and stable  Post vital signs: Reviewed and stable  Last Vitals:  Filed Vitals:   04/12/15 0849 04/12/15 0916  BP: 125/68 115/78  Pulse: 54 51  Temp: 36.7 C   Resp: 18 18    Complications: No apparent anesthesia complications

## 2015-04-12 NOTE — Anesthesia Procedure Notes (Signed)
Procedure Name: MAC Date/Time: 04/12/2015 9:19 AM Performed by: Andree Elk, AMY A Pre-anesthesia Checklist: Patient identified, Timeout performed, Emergency Drugs available, Suction available and Patient being monitored Oxygen Delivery Method: Nasal cannula

## 2015-04-12 NOTE — Anesthesia Preprocedure Evaluation (Signed)
Anesthesia Evaluation  Patient identified by MRN, date of birth, ID band Patient awake    Reviewed: Allergy & Precautions, NPO status , Patient's Chart, lab work & pertinent test results  Airway Mallampati: III  TM Distance: >3 FB     Dental  (+) Teeth Intact, Dental Advisory Given   Pulmonary shortness of breath and with exertion, sleep apnea and Continuous Positive Airway Pressure Ventilation ,    breath sounds clear to auscultation       Cardiovascular hypertension, Pt. on medications +CHF and + DOE   Rhythm:Regular Rate:Normal     Neuro/Psych  Headaches, PSYCHIATRIC DISORDERS Anxiety    GI/Hepatic GERD  ,  Endo/Other    Renal/GU      Musculoskeletal  (+) Fibromyalgia -  Abdominal   Peds  Hematology  (+) anemia ,   Anesthesia Other Findings   Reproductive/Obstetrics                             Anesthesia Physical Anesthesia Plan  ASA: III  Anesthesia Plan: MAC   Post-op Pain Management:    Induction: Intravenous  Airway Management Planned: Nasal Cannula  Additional Equipment:   Intra-op Plan:   Post-operative Plan:   Informed Consent: I have reviewed the patients History and Physical, chart, labs and discussed the procedure including the risks, benefits and alternatives for the proposed anesthesia with the patient or authorized representative who has indicated his/her understanding and acceptance.     Plan Discussed with:   Anesthesia Plan Comments:         Anesthesia Quick Evaluation

## 2015-04-12 NOTE — Discharge Instructions (Signed)
°  °          [facility] Instructions °[address]- [place] [address] °    ° °1. Avoid closing eyes tightly. One often closes the eye tightly when laughing, talking, sneezing, coughing or if they feel irritated. At these times, you should be careful not to close your eyes tightly. ° °2. Instill eye drops as instructed. To instill drops in your eye, open it, look up and have someone gently pull the lower lid down and instill a couple of drops inside the lower lid. ° °3. Do not touch upper lid. ° °4. Take Advil or Tylenol for pain. ° °5. You may use either eye for near work, such as reading or sewing and you may watch television. ° °6. You may have your hair done at the beauty parlor at any time. ° °7. Wear dark glasses with or without your own glasses if you are in bright light. ° °8. Call our office at [phone] or [phone] if you have sharp pain in your eye or unusual symptoms. ° °9.  FOLLOW UP WITH DR. SHAPIRO TODAY IN HIS [place] OFFICE AT 2:45pm. ° °  °I have received a copy of the above instructions and will follow them.  ° ° ° °IF YOU ARE IN IMMEDIATE DANGER CALL 911! ° °It is important for you to keep your follow-up appointment with your physician after discharge, OR, for you /your caregiver to make a follow-up appointment with your physician / medical provider after discharge. ° °Show these instructions to the next healthcare provider you see. °PATIENT INSTRUCTIONS °POST-ANESTHESIA ° °IMMEDIATELY FOLLOWING SURGERY:  Do not drive or operate machinery for the first twenty four hours after surgery.  Do not make any important decisions for twenty four hours after surgery or while taking narcotic pain medications or sedatives.  If you develop intractable nausea and vomiting or a severe headache please notify your doctor immediately. ° °FOLLOW-UP:  Please make an appointment with your surgeon as instructed. You do not need to follow up with anesthesia unless  specifically instructed to do so. ° °WOUND CARE INSTRUCTIONS (if applicable):  Keep a dry clean dressing on the anesthesia/puncture wound site if there is drainage.  Once the wound has quit draining you may leave it open to air.  Generally you should leave the bandage intact for twenty four hours unless there is drainage.  If the epidural site drains for more than 36-48 hours please call the anesthesia department. ° °QUESTIONS?:  Please feel free to call your physician or the hospital operator if you have any questions, and they will be happy to assist you.    ° ° ° °

## 2015-04-12 NOTE — Op Note (Signed)
Patient brought to the operating room and prepped and draped in the usual manner.  Lid speculum inserted in right eye.  Stab incision made at the twelve o'clock position.  Provisc instilled in the anterior chamber.   A 2.4 mm. Stab incision was made temporally.  An anterior capsulotomy was done with a bent 25 gauge needle.  The nucleus was hydrodissected.  The Phaco tip was inserted in the anterior chamber and the nucleus was emulsified.  CDE was 7.02.  The cortical material was then removed with the I and A tip.  Posterior capsule was the polished.  The anterior chamber was deepened with Provisc.  A 21.5 Diopter Hoya 250 IOL was then inserted in the capsular bag.  Provisc was then removed with the I and A tip.  The wound was then hydrated.  Patient sent to the Recovery Room in good condition with follow up in my office.  Preoperative Diagnosis:  Nuclear Cataract OD Postoperative Diagnosis:  Same Procedure name: Kelman Phacoemulsification OD with IOL

## 2015-04-12 NOTE — H&P (Signed)
The patient was re examined and there is no change in the patients condition since the original H and P. 

## 2015-04-13 ENCOUNTER — Encounter (HOSPITAL_COMMUNITY): Payer: Self-pay | Admitting: Ophthalmology

## 2015-04-20 NOTE — Patient Instructions (Signed)
Your procedure is scheduled on:  04/26/2015               Report to [facility] at 6:15    AM.  Call this number if you have problems the morning of surgery:[phone]   Remember:   Do not eat or drink :After Midnight.    Take these medicines the morning of surgery with A SIP OF WATER:    Xanax, amlodipine, Nexium, Levothyroxine and lisinopril        Do not wear jewelry, make-up or nail polish.  Do not wear lotions, powders, or perfumes. You may wear deodorant.  Do not shave 48 hours prior to surgery.  Do not bring valuables to the hospital.  Contacts, dentures or bridgework may not be worn into surgery.  Patients discharged the day of surgery will not be allowed to drive home.  Name and phone number of your driver:    @[email or web address] @ Cataract Surgery  A cataract is a clouding of the lens of the eye. When a lens becomes cloudy, vision is reduced based on the degree and nature of the clouding. Surgery may be needed to improve vision. Surgery removes the cloudy lens and usually replaces it with a substitute lens (intraocular lens, IOL). LET YOUR EYE DOCTOR KNOW ABOUT:  Allergies to food or medicine.   Medicines taken including herbs, eyedrops, over-the-counter medicines, and creams.   Use of steroids (by mouth or creams).   Previous problems with anesthetics or numbing medicine.   History of bleeding problems or blood clots.   Previous surgery.   Other health problems, including diabetes and kidney problems.   Possibility of pregnancy, if this applies.  RISKS AND COMPLICATIONS  Infection.   Inflammation of the eyeball (endophthalmitis) that can spread to both eyes (sympathetic ophthalmia).   Poor wound healing.   If an IOL is inserted, it can later fall out of proper position. This is very uncommon.   Clouding of the part of your eye that holds an IOL in place. This is called an "after-cataract." These are uncommon, but easily treated.  BEFORE THE PROCEDURE  Do  not eat or drink anything except small amounts of water for 8 to 12 before your surgery, or as directed by your caregiver.   Unless you are told otherwise, continue any eyedrops you have been prescribed.   Talk to your primary caregiver about all other medicines that you take (both prescription and non-prescription). In some cases, you may need to stop or change medicines near the time of your surgery. This is most important if you are taking blood-thinning medicine.Do not stop medicines unless you are told to do so.   Arrange for someone to drive you to and from the procedure.   Do not put contact lenses in either eye on the day of your surgery.  PROCEDURE There is more than one method for safely removing a cataract. Your doctor can explain the differences and help determine which is best for you. Phacoemulsification surgery is the most common form of cataract surgery.  An injection is given behind the eye or eyedrops are given to make this a painless procedure.   A small cut (incision) is made on the edge of the clear, dome-shaped surface that covers the front of the eye (cornea).   A tiny probe is painlessly inserted into the eye. This device gives off ultrasound waves that soften and break up the cloudy center of the lens. This makes it easier for the  cloudy lens to be removed by suction.   An IOL may be implanted.   The normal lens of the eye is covered by a clear capsule. Part of that capsule is intentionally left in the eye to support the IOL.   Your surgeon may or may not use stitches to close the incision.  There are other forms of cataract surgery that require a larger incision and stiches to close the eye. This approach is taken in cases where the doctor feels that the cataract cannot be easily removed using phacoemulsification. AFTER THE PROCEDURE  When an IOL is implanted, it does not need care. It becomes a permanent part of your eye and cannot be seen or felt.   Your  doctor will schedule follow-up exams to check on your progress.   Review your other medicines with your doctor to see which can be resumed after surgery.   Use eyedrops or take medicine as prescribed by your doctor.  Document Released: 03/08/2011 Document Reviewed: 03/05/2011 [facility] Patient Information 2012 [place].  .Cataract Surgery Care After Refer to this sheet in the next few weeks. These instructions provide you with information on caring for yourself after your procedure. Your caregiver may also give you more specific instructions. Your treatment has been planned according to current medical practices, but problems sometimes occur. Call your caregiver if you have any problems or questions after your procedure.  HOME CARE INSTRUCTIONS   Avoid strenuous activities as directed by your caregiver.   Ask your caregiver when you can resume driving.   Use eyedrops or other medicines to help healing and control pressure inside your eye as directed by your caregiver.   Only take over-the-counter or prescription medicines for pain, discomfort, or fever as directed by your caregiver.   Do not to touch or rub your eyes.   You may be instructed to use a protective shield during the first few days and nights after surgery. If not, wear sunglasses to protect your eyes. This is to protect the eye from pressure or from being accidentally bumped.   Keep the area around your eye clean and dry. Avoid swimming or allowing water to hit you directly in the face while showering. Keep soap and shampoo out of your eyes.   Do not bend or lift heavy objects. Bending increases pressure in the eye. You can walk, climb stairs, and do light household chores.   Do not put a contact lens into the eye that had surgery until your caregiver says it is okay to do so.   Ask your doctor when you can return to work. This will depend on the kind of work that you do. If you work in a dusty environment, you may  be advised to wear protective eyewear for a period of time.   Ask your caregiver when it will be safe to engage in sexual activity.   Continue with your regular eye exams as directed by your caregiver.  What to expect:  It is normal to feel itching and mild discomfort for a few days after cataract surgery. Some fluid discharge is also common, and your eye may be sensitive to light and touch.   After 1 to 2 days, even moderate discomfort should disappear. In most cases, healing will take about 6 weeks.   If you received an intraocular lens (IOL), you may notice that colors are very bright or have a blue tinge. Also, if you have been in bright sunlight, everything may appear reddish  for a few hours. If you see these color tinges, it is because your lens is clear and no longer cloudy. Within a few months after receiving an IOL, these extra colors should go away. When you have healed, you will probably need new glasses.  SEEK MEDICAL CARE IF:   You have increased bruising around your eye.   You have discomfort not helped by medicine.  SEEK IMMEDIATE MEDICAL CARE IF:   You have a fever.   You have a worsening or sudden vision loss.   You have redness, swelling, or increasing pain in the eye.   You have a thick discharge from the eye that had surgery.  MAKE SURE YOU:  Understand these instructions.   Will watch your condition.   Will get help right away if you are not doing well or get worse.  Document Released: 10/06/2004 Document Revised: 03/08/2011 Document Reviewed: 11/10/2010 [facility] Patient Information 2012 [place].    Monitored Anesthesia Care  Monitored anesthesia care is an anesthesia service for a medical procedure. Anesthesia is the loss of the ability to feel pain. It is produced by medications called anesthetics. It may affect a small area of your body (local anesthesia), a large area of your body (regional anesthesia), or your entire body (general anesthesia).  The need for monitored anesthesia care depends your procedure, your condition, and the potential need for regional or general anesthesia. It is often provided during procedures where:   General anesthesia may be needed if there are complications. This is because you need special care when you are under general anesthesia.   You will be under local or regional anesthesia. This is so that you are able to have higher levels of anesthesia if needed.   You will receive calming medications (sedatives). This is especially the case if sedatives are given to put you in a semi-conscious state of relaxation (deep sedation). This is because the amount of sedative needed to produce this state can be hard to predict. Too much of a sedative can produce general anesthesia. Monitored anesthesia care is performed by one or more caregivers who have special training in all types of anesthesia. You will need to meet with these caregivers before your procedure. During this meeting, they will ask you about your medical history. They will also give you instructions to follow. (For example, you will need to stop eating and drinking before your procedure. You may also need to stop or change medications you are taking.) During your procedure, your caregivers will stay with you. They will:   Watch your condition. This includes watching you blood pressure, breathing, and level of pain.   Diagnose and treat problems that occur.   Give medications if they are needed. These may include calming medications (sedatives) and anesthetics.   Make sure you are comfortable.  Having monitored anesthesia care does not necessarily mean that you will be under anesthesia. It does mean that your caregivers will be able to manage anesthesia if you need it or if it occurs. It also means that you will be able to have a different type of anesthesia than you are having if you need it. When your procedure is complete, your caregivers will  continue to watch your condition. They will make sure any medications wear off before you are allowed to go home.  Document Released: 12/13/2004 Document Revised: 07/14/2012 Document Reviewed: 04/30/2012 [facility] Patient Information 2014 [place], [place].

## 2015-04-21 ENCOUNTER — Encounter (HOSPITAL_COMMUNITY)
Admission: RE | Admit: 2015-04-21 | Discharge: 2015-04-21 | Disposition: A | Discharge status: Home / Self Care | Payer: Medicare Other | Source: Ambulatory Visit | Attending: Ophthalmology | Admitting: Ophthalmology

## 2015-04-21 NOTE — Pre-Procedure Instructions (Signed)
Attempted to contact patient for PAT visit.  No answer.

## 2015-04-25 DIAGNOSIS — D3A00 Benign carcinoid tumor of unspecified site: Secondary | ICD-10-CM | POA: Diagnosis not present

## 2015-04-25 DIAGNOSIS — C7B02 Secondary carcinoid tumors of liver: Secondary | ICD-10-CM | POA: Diagnosis not present

## 2015-04-25 DIAGNOSIS — C7A00 Malignant carcinoid tumor of unspecified site: Secondary | ICD-10-CM | POA: Diagnosis not present

## 2015-04-25 DIAGNOSIS — Z79899 Other long term (current) drug therapy: Secondary | ICD-10-CM | POA: Diagnosis not present

## 2015-04-25 MED ORDER — TETRACAINE HCL 0.5 % OP SOLN
OPHTHALMIC | Status: AC
  Filled 2015-04-25: qty 4

## 2015-04-25 MED ORDER — CYCLOPENTOLATE-PHENYLEPHRINE OP SOLN OPTIME - NO CHARGE
OPHTHALMIC | Status: AC
  Filled 2015-04-25: qty 2

## 2015-04-25 MED ORDER — PHENYLEPHRINE HCL 2.5 % OP SOLN
OPHTHALMIC | Status: AC
  Filled 2015-04-25: qty 15

## 2015-04-25 MED ORDER — KETOROLAC TROMETHAMINE 0.5 % OP SOLN
OPHTHALMIC | Status: AC
  Filled 2015-04-25: qty 5

## 2015-04-26 ENCOUNTER — Ambulatory Visit (HOSPITAL_COMMUNITY): Payer: Medicare Other | Admitting: Anesthesiology

## 2015-04-26 ENCOUNTER — Encounter (HOSPITAL_COMMUNITY): Payer: Self-pay | Admitting: *Deleted

## 2015-04-26 ENCOUNTER — Encounter (HOSPITAL_COMMUNITY)
Admission: RE | Disposition: A | Discharge status: Home / Self Care | Payer: Self-pay | Source: Ambulatory Visit | Attending: Ophthalmology

## 2015-04-26 ENCOUNTER — Ambulatory Visit (HOSPITAL_COMMUNITY)
Admission: RE | Admit: 2015-04-26 | Discharge: 2015-04-26 | Disposition: A | Discharge status: Home / Self Care | Payer: Medicare Other | Source: Ambulatory Visit | Attending: Ophthalmology | Admitting: Ophthalmology

## 2015-04-26 DIAGNOSIS — F419 Anxiety disorder, unspecified: Secondary | ICD-10-CM | POA: Diagnosis not present

## 2015-04-26 DIAGNOSIS — K219 Gastro-esophageal reflux disease without esophagitis: Secondary | ICD-10-CM | POA: Diagnosis not present

## 2015-04-26 DIAGNOSIS — G4730 Sleep apnea, unspecified: Secondary | ICD-10-CM | POA: Diagnosis not present

## 2015-04-26 DIAGNOSIS — I509 Heart failure, unspecified: Secondary | ICD-10-CM | POA: Diagnosis not present

## 2015-04-26 DIAGNOSIS — Z79899 Other long term (current) drug therapy: Secondary | ICD-10-CM | POA: Diagnosis not present

## 2015-04-26 DIAGNOSIS — H27132 Posterior dislocation of lens, left eye: Secondary | ICD-10-CM | POA: Diagnosis not present

## 2015-04-26 DIAGNOSIS — H268 Other specified cataract: Secondary | ICD-10-CM | POA: Insufficient documentation

## 2015-04-26 DIAGNOSIS — H269 Unspecified cataract: Secondary | ICD-10-CM | POA: Diagnosis not present

## 2015-04-26 DIAGNOSIS — Z961 Presence of intraocular lens: Secondary | ICD-10-CM | POA: Diagnosis not present

## 2015-04-26 DIAGNOSIS — M797 Fibromyalgia: Secondary | ICD-10-CM | POA: Diagnosis not present

## 2015-04-26 DIAGNOSIS — I110 Hypertensive heart disease with heart failure: Secondary | ICD-10-CM | POA: Diagnosis not present

## 2015-04-26 DIAGNOSIS — T8522XA Displacement of intraocular lens, initial encounter: Secondary | ICD-10-CM | POA: Diagnosis not present

## 2015-04-26 DIAGNOSIS — H2512 Age-related nuclear cataract, left eye: Secondary | ICD-10-CM | POA: Diagnosis not present

## 2015-04-26 DIAGNOSIS — H2702 Aphakia, left eye: Secondary | ICD-10-CM | POA: Diagnosis not present

## 2015-04-26 HISTORY — PX: CATARACT EXTRACTION W/PHACO: SHX586

## 2015-04-26 SURGERY — PHACOEMULSIFICATION, CATARACT, WITH IOL INSERTION
Anesthesia: Monitor Anesthesia Care | Site: Eye | Laterality: Left

## 2015-04-26 MED ORDER — CYCLOPENTOLATE-PHENYLEPHRINE 0.2-1 % OP SOLN
1.0000 [drp] | OPHTHALMIC | Status: AC
  Administered 2015-04-26 (×3): 1 [drp] via OPHTHALMIC

## 2015-04-26 MED ORDER — TETRACAINE 0.5 % OP SOLN OPTIME - NO CHARGE
OPHTHALMIC | Status: DC | PRN
  Administered 2015-04-26: 2 [drp] via OPHTHALMIC

## 2015-04-26 MED ORDER — FENTANYL CITRATE (PF) 100 MCG/2ML IJ SOLN
25.0000 ug | INTRAMUSCULAR | Status: AC
  Administered 2015-04-26 (×2): 25 ug via INTRAVENOUS

## 2015-04-26 MED ORDER — MIDAZOLAM HCL 2 MG/2ML IJ SOLN
INTRAMUSCULAR | Status: AC
  Filled 2015-04-26: qty 2

## 2015-04-26 MED ORDER — LACTATED RINGERS IV SOLN
INTRAVENOUS | Status: DC
  Administered 2015-04-26: 07:00:00 via INTRAVENOUS

## 2015-04-26 MED ORDER — TETRACAINE HCL 0.5 % OP SOLN
1.0000 [drp] | OPHTHALMIC | Status: AC
  Administered 2015-04-26 (×3): 1 [drp] via OPHTHALMIC

## 2015-04-26 MED ORDER — PROVISC 10 MG/ML IO SOLN
INTRAOCULAR | Status: DC | PRN
  Administered 2015-04-26: 0.85 mL via INTRAOCULAR

## 2015-04-26 MED ORDER — EPINEPHRINE HCL 1 MG/ML IJ SOLN
INTRAMUSCULAR | Status: AC
  Filled 2015-04-26: qty 1

## 2015-04-26 MED ORDER — KETOROLAC TROMETHAMINE 0.5 % OP SOLN
1.0000 [drp] | OPHTHALMIC | Status: AC
  Administered 2015-04-26 (×3): 1 [drp] via OPHTHALMIC

## 2015-04-26 MED ORDER — MIDAZOLAM HCL 2 MG/2ML IJ SOLN
1.0000 mg | INTRAMUSCULAR | Status: DC | PRN
  Administered 2015-04-26 (×2): 2 mg via INTRAVENOUS
  Filled 2015-04-26: qty 2

## 2015-04-26 MED ORDER — BSS IO SOLN
INTRAOCULAR | Status: DC | PRN
  Administered 2015-04-26: 15 mL

## 2015-04-26 MED ORDER — FENTANYL CITRATE (PF) 100 MCG/2ML IJ SOLN
INTRAMUSCULAR | Status: AC
  Filled 2015-04-26: qty 2

## 2015-04-26 MED ORDER — EPINEPHRINE HCL 1 MG/ML IJ SOLN
INTRAOCULAR | Status: DC | PRN
  Administered 2015-04-26: 500 mL

## 2015-04-26 MED ORDER — LIDOCAINE HCL (PF) 1 % IJ SOLN
INTRAMUSCULAR | Status: AC
  Filled 2015-04-26: qty 2

## 2015-04-26 MED ORDER — PHENYLEPHRINE HCL 2.5 % OP SOLN
1.0000 [drp] | OPHTHALMIC | Status: AC
  Administered 2015-04-26 (×3): 1 [drp] via OPHTHALMIC

## 2015-04-26 SURGICAL SUPPLY — 11 items
CLOTH BEACON ORANGE TIMEOUT ST (SAFETY) ×3 IMPLANT
EYE SHIELD UNIVERSAL CLEAR (GAUZE/BANDAGES/DRESSINGS) ×3 IMPLANT
GLOVE BIO SURGEON STRL SZ 6.5 (GLOVE) ×2 IMPLANT
GLOVE BIO SURGEONS STRL SZ 6.5 (GLOVE) ×1
GLOVE BIOGEL PI IND STRL 7.0 (GLOVE) ×1 IMPLANT
GLOVE BIOGEL PI INDICATOR 7.0 (GLOVE) ×2
LENS ALC ACRYL/TECN (Ophthalmic Related) ×3 IMPLANT
PAD ARMBOARD 7.5X6 YLW CONV (MISCELLANEOUS) ×3 IMPLANT
TAPE SURG TRANSPORE 1 IN (GAUZE/BANDAGES/DRESSINGS) ×1 IMPLANT
TAPE SURGICAL TRANSPORE 1 IN (GAUZE/BANDAGES/DRESSINGS) ×2
WATER STERILE IRR 250ML POUR (IV SOLUTION) ×3 IMPLANT

## 2015-04-26 NOTE — Op Note (Signed)
Patient brought to the operating room and prepped and draped in the usual manner.  Lid speculum inserted in left eye.  Stab incision made at the twelve o'clock position.  Provisc instilled in the anterior chamber.   A 2.4 mm. Stab incision was made temporally.  An anterior capsulotomy was done with a bent 25 gauge needle.  The nucleus was hydrodissected.  The Phaco tip was inserted in the anterior chamber and the nucleus was emulsified.  CDE was 5.74.  The cortical material was then removed with the I and A tip.  Posterior capsule was the polished.  The anterior chamber was deepened with Provisc.  A 21.5 Diopter Hoya Model 250 IOL was then inserted in the capsular bag.  Provisc was then removed with the I and A tip. It was noted that the IOL was decentered due a break in the posterior capsule.  The IOL was centered with a Heather Roberts as best as possible.  The wound was then hydrated.  Patient sent to the Recovery Room in good condition with follow up in my office.  Preoperative Diagnosis:  Nuclear Cataract OS Postoperative Diagnosis:  Same Procedure name: Kelman Phacoemulsification OS with IOL

## 2015-04-26 NOTE — Anesthesia Preprocedure Evaluation (Addendum)
Anesthesia Evaluation  Patient identified by MRN, date of birth, ID band Patient awake    Reviewed: Allergy & Precautions, NPO status , Patient's Chart, lab work & pertinent test results  Airway Mallampati: III  TM Distance: >3 FB     Dental  (+) Teeth Intact, Dental Advisory Given   Pulmonary shortness of breath and with exertion, sleep apnea and Continuous Positive Airway Pressure Ventilation ,    breath sounds clear to auscultation       Cardiovascular hypertension, Pt. on medications +CHF and + DOE   Rhythm:Regular Rate:Normal     Neuro/Psych  Headaches, PSYCHIATRIC DISORDERS Anxiety    GI/Hepatic GERD  ,  Endo/Other    Renal/GU      Musculoskeletal  (+) Fibromyalgia -  Abdominal   Peds  Hematology  (+) anemia ,   Anesthesia Other Findings   Reproductive/Obstetrics                            Anesthesia Physical Anesthesia Plan  ASA: III  Anesthesia Plan: MAC   Post-op Pain Management:    Induction: Intravenous  Airway Management Planned: Nasal Cannula  Additional Equipment:   Intra-op Plan:   Post-operative Plan:   Informed Consent: I have reviewed the patients History and Physical, chart, labs and discussed the procedure including the risks, benefits and alternatives for the proposed anesthesia with the patient or authorized representative who has indicated his/her understanding and acceptance.     Plan Discussed with:   Anesthesia Plan Comments:         Anesthesia Quick Evaluation

## 2015-04-26 NOTE — Transfer of Care (Signed)
Immediate Anesthesia Transfer of Care Note  Patient: Margaret Cowan  Procedure(s) Performed: Procedure(s) with comments: CATARACT EXTRACTION LEFT EYE PHACO AND INTRAOCULAR LENS PLACEMENT  (Left) - CDE:5.74  Patient Location: Short Stay  Anesthesia Type:MAC  Level of Consciousness: awake, alert  and patient cooperative  Airway & Oxygen Therapy: Patient Spontanous Breathing  Post-op Assessment: Report given to RN, Post -op Vital signs reviewed and stable and Patient moving all extremities  Post vital signs: Reviewed and stable  Last Vitals:  Filed Vitals:   04/26/15 0715 04/26/15 0720  BP: 123/56 122/59  Pulse:    Temp:    Resp: 25 15    Complications: No apparent anesthesia complications

## 2015-04-26 NOTE — H&P (Signed)
The patient was re examined and there is no change in the patients condition since the original H and P. 

## 2015-04-26 NOTE — Discharge Instructions (Signed)
°  °          [facility] Instructions °[address]- [place] [address]-[place] °    ° °1. Avoid closing eyes tightly. One often closes the eye tightly when laughing, talking, sneezing, coughing or if they feel irritated. At these times, you should be careful not to close your eyes tightly. ° °2. Instill eye drops as instructed. To instill drops in your eye, open it, look up and have someone gently pull the lower lid down and instill a couple of drops inside the lower lid. ° °3. Do not touch upper lid. ° °4. Take Advil or Tylenol for pain. ° °5. You may use either eye for near work, such as reading or sewing and you may watch television. ° °6. You may have your hair done at the beauty parlor at any time. ° °7. Wear dark glasses with or without your own glasses if you are in bright light. ° °8. Call our office at [phone] or [phone] if you have sharp pain in your eye or unusual symptoms. ° °9.  FOLLOW UP WITH DR. SHAPIRO TODAY IN HIS [place] OFFICE AT 2:45pm. ° °  °I have received a copy of the above instructions and will follow them.  ° ° ° °IF YOU ARE IN IMMEDIATE DANGER CALL 911! ° °It is important for you to keep your follow-up appointment with your physician after discharge, OR, for you /your caregiver to make a follow-up appointment with your physician / medical provider after discharge. ° °Show these instructions to the next healthcare provider you see. ° °

## 2015-04-26 NOTE — Anesthesia Postprocedure Evaluation (Signed)
Anesthesia Post Note  Patient: Margaret Cowan  Procedure(s) Performed: Procedure(s) (LRB): CATARACT EXTRACTION LEFT EYE PHACO AND INTRAOCULAR LENS PLACEMENT  (Left)  Patient location during evaluation: Short Stay Anesthesia Type: MAC Level of consciousness: awake and alert and patient cooperative Pain management: pain level controlled Vital Signs Assessment: post-procedure vital signs reviewed and stable Respiratory status: spontaneous breathing and nonlabored ventilation Cardiovascular status: stable Postop Assessment: no signs of nausea or vomiting Anesthetic complications: no    Last Vitals:  Filed Vitals:   04/26/15 0715 04/26/15 0720  BP: 123/56 122/59  Pulse:    Temp:    Resp: 25 15    Last Pain: There were no vitals filed for this visit.               Sheneika Walstad J

## 2015-04-27 ENCOUNTER — Encounter (HOSPITAL_COMMUNITY): Payer: Self-pay | Admitting: Ophthalmology

## 2015-04-27 DIAGNOSIS — H43822 Vitreomacular adhesion, left eye: Secondary | ICD-10-CM | POA: Diagnosis not present

## 2015-04-27 DIAGNOSIS — T8522XA Displacement of intraocular lens, initial encounter: Secondary | ICD-10-CM | POA: Diagnosis not present

## 2015-04-27 DIAGNOSIS — H27132 Posterior dislocation of lens, left eye: Secondary | ICD-10-CM | POA: Diagnosis not present

## 2015-04-27 DIAGNOSIS — H547 Unspecified visual loss: Secondary | ICD-10-CM | POA: Diagnosis not present

## 2015-04-27 DIAGNOSIS — Z961 Presence of intraocular lens: Secondary | ICD-10-CM | POA: Diagnosis not present

## 2015-04-27 DIAGNOSIS — H2702 Aphakia, left eye: Secondary | ICD-10-CM | POA: Diagnosis not present

## 2015-05-06 DIAGNOSIS — M25561 Pain in right knee: Secondary | ICD-10-CM | POA: Diagnosis not present

## 2015-05-06 DIAGNOSIS — R1084 Generalized abdominal pain: Secondary | ICD-10-CM | POA: Diagnosis not present

## 2015-05-06 DIAGNOSIS — M545 Low back pain: Secondary | ICD-10-CM | POA: Diagnosis not present

## 2015-05-06 DIAGNOSIS — M150 Primary generalized (osteo)arthritis: Secondary | ICD-10-CM | POA: Diagnosis not present

## 2015-05-10 DIAGNOSIS — D3A00 Benign carcinoid tumor of unspecified site: Secondary | ICD-10-CM | POA: Diagnosis not present

## 2015-05-13 DIAGNOSIS — E6609 Other obesity due to excess calories: Secondary | ICD-10-CM | POA: Diagnosis not present

## 2015-05-13 DIAGNOSIS — Z6836 Body mass index (BMI) 36.0-36.9, adult: Secondary | ICD-10-CM | POA: Diagnosis not present

## 2015-05-13 DIAGNOSIS — E063 Autoimmune thyroiditis: Secondary | ICD-10-CM | POA: Diagnosis not present

## 2015-05-13 DIAGNOSIS — E669 Obesity, unspecified: Secondary | ICD-10-CM | POA: Diagnosis not present

## 2015-05-13 DIAGNOSIS — Z1389 Encounter for screening for other disorder: Secondary | ICD-10-CM | POA: Diagnosis not present

## 2015-05-13 DIAGNOSIS — I10 Essential (primary) hypertension: Secondary | ICD-10-CM | POA: Diagnosis not present

## 2015-05-13 DIAGNOSIS — D3A00 Benign carcinoid tumor of unspecified site: Secondary | ICD-10-CM | POA: Diagnosis not present

## 2015-05-13 DIAGNOSIS — R739 Hyperglycemia, unspecified: Secondary | ICD-10-CM | POA: Diagnosis not present

## 2015-05-13 DIAGNOSIS — F419 Anxiety disorder, unspecified: Secondary | ICD-10-CM | POA: Diagnosis not present

## 2015-05-13 DIAGNOSIS — E782 Mixed hyperlipidemia: Secondary | ICD-10-CM | POA: Diagnosis not present

## 2015-05-23 DIAGNOSIS — D3A00 Benign carcinoid tumor of unspecified site: Secondary | ICD-10-CM | POA: Diagnosis not present

## 2015-05-23 DIAGNOSIS — I7090 Unspecified atherosclerosis: Secondary | ICD-10-CM | POA: Diagnosis not present

## 2015-05-23 DIAGNOSIS — Z79899 Other long term (current) drug therapy: Secondary | ICD-10-CM | POA: Diagnosis not present

## 2015-05-23 DIAGNOSIS — Z5111 Encounter for antineoplastic chemotherapy: Secondary | ICD-10-CM | POA: Diagnosis not present

## 2015-05-23 DIAGNOSIS — C7A8 Other malignant neuroendocrine tumors: Secondary | ICD-10-CM | POA: Diagnosis not present

## 2015-05-23 DIAGNOSIS — Z90710 Acquired absence of both cervix and uterus: Secondary | ICD-10-CM | POA: Diagnosis not present

## 2015-05-23 DIAGNOSIS — C7B02 Secondary carcinoid tumors of liver: Secondary | ICD-10-CM | POA: Diagnosis not present

## 2015-05-23 DIAGNOSIS — K6389 Other specified diseases of intestine: Secondary | ICD-10-CM | POA: Diagnosis not present

## 2015-05-23 DIAGNOSIS — N2889 Other specified disorders of kidney and ureter: Secondary | ICD-10-CM | POA: Diagnosis not present

## 2015-05-23 DIAGNOSIS — Z96641 Presence of right artificial hip joint: Secondary | ICD-10-CM | POA: Diagnosis not present

## 2015-05-25 ENCOUNTER — Other Ambulatory Visit (HOSPITAL_COMMUNITY): Payer: Self-pay | Admitting: Internal Medicine

## 2015-05-25 DIAGNOSIS — D1800 Hemangioma unspecified site: Secondary | ICD-10-CM | POA: Diagnosis not present

## 2015-05-25 DIAGNOSIS — M810 Age-related osteoporosis without current pathological fracture: Secondary | ICD-10-CM

## 2015-05-25 DIAGNOSIS — L821 Other seborrheic keratosis: Secondary | ICD-10-CM | POA: Diagnosis not present

## 2015-05-31 ENCOUNTER — Ambulatory Visit (HOSPITAL_COMMUNITY)
Admission: RE | Admit: 2015-05-31 | Discharge: 2015-05-31 | Disposition: A | Discharge status: Home / Self Care | Payer: Medicare Other | Source: Ambulatory Visit | Attending: Internal Medicine | Admitting: Internal Medicine

## 2015-05-31 DIAGNOSIS — M810 Age-related osteoporosis without current pathological fracture: Secondary | ICD-10-CM

## 2015-05-31 DIAGNOSIS — Z780 Asymptomatic menopausal state: Secondary | ICD-10-CM | POA: Insufficient documentation

## 2015-05-31 DIAGNOSIS — Z13820 Encounter for screening for osteoporosis: Secondary | ICD-10-CM | POA: Insufficient documentation

## 2015-06-20 DIAGNOSIS — D3A00 Benign carcinoid tumor of unspecified site: Secondary | ICD-10-CM | POA: Diagnosis not present

## 2015-06-20 DIAGNOSIS — Z79899 Other long term (current) drug therapy: Secondary | ICD-10-CM | POA: Diagnosis not present

## 2015-06-20 DIAGNOSIS — Z5111 Encounter for antineoplastic chemotherapy: Secondary | ICD-10-CM | POA: Diagnosis not present

## 2015-06-20 DIAGNOSIS — D3A8 Other benign neuroendocrine tumors: Secondary | ICD-10-CM | POA: Diagnosis not present

## 2015-07-01 DIAGNOSIS — Z1389 Encounter for screening for other disorder: Secondary | ICD-10-CM | POA: Diagnosis not present

## 2015-07-01 DIAGNOSIS — Z0000 Encounter for general adult medical examination without abnormal findings: Secondary | ICD-10-CM | POA: Diagnosis not present

## 2015-07-01 DIAGNOSIS — Z6837 Body mass index (BMI) 37.0-37.9, adult: Secondary | ICD-10-CM | POA: Diagnosis not present

## 2015-07-18 DIAGNOSIS — C7B09 Secondary carcinoid tumors of other sites: Secondary | ICD-10-CM | POA: Diagnosis not present

## 2015-07-18 DIAGNOSIS — Z5111 Encounter for antineoplastic chemotherapy: Secondary | ICD-10-CM | POA: Diagnosis not present

## 2015-07-18 DIAGNOSIS — D3A00 Benign carcinoid tumor of unspecified site: Secondary | ICD-10-CM | POA: Diagnosis not present

## 2015-07-18 DIAGNOSIS — Z79899 Other long term (current) drug therapy: Secondary | ICD-10-CM | POA: Diagnosis not present

## 2015-07-18 DIAGNOSIS — C7A8 Other malignant neuroendocrine tumors: Secondary | ICD-10-CM | POA: Diagnosis not present

## 2015-07-18 DIAGNOSIS — C7B02 Secondary carcinoid tumors of liver: Secondary | ICD-10-CM | POA: Diagnosis not present

## 2015-07-28 DIAGNOSIS — D3A00 Benign carcinoid tumor of unspecified site: Secondary | ICD-10-CM | POA: Diagnosis not present

## 2015-07-29 DIAGNOSIS — R1084 Generalized abdominal pain: Secondary | ICD-10-CM | POA: Diagnosis not present

## 2015-07-29 DIAGNOSIS — G8929 Other chronic pain: Secondary | ICD-10-CM | POA: Diagnosis not present

## 2015-07-29 DIAGNOSIS — M25561 Pain in right knee: Secondary | ICD-10-CM | POA: Diagnosis not present

## 2015-07-29 DIAGNOSIS — M150 Primary generalized (osteo)arthritis: Secondary | ICD-10-CM | POA: Diagnosis not present

## 2015-07-29 DIAGNOSIS — M545 Low back pain: Secondary | ICD-10-CM | POA: Diagnosis not present

## 2015-08-10 DIAGNOSIS — K219 Gastro-esophageal reflux disease without esophagitis: Secondary | ICD-10-CM | POA: Diagnosis not present

## 2015-08-10 DIAGNOSIS — R790 Abnormal level of blood mineral: Secondary | ICD-10-CM | POA: Diagnosis not present

## 2015-08-10 DIAGNOSIS — K5909 Other constipation: Secondary | ICD-10-CM | POA: Diagnosis not present

## 2015-08-10 DIAGNOSIS — D649 Anemia, unspecified: Secondary | ICD-10-CM | POA: Diagnosis not present

## 2015-08-10 DIAGNOSIS — D509 Iron deficiency anemia, unspecified: Secondary | ICD-10-CM | POA: Diagnosis not present

## 2015-08-10 DIAGNOSIS — D3A00 Benign carcinoid tumor of unspecified site: Secondary | ICD-10-CM | POA: Diagnosis not present

## 2015-08-10 DIAGNOSIS — I10 Essential (primary) hypertension: Secondary | ICD-10-CM | POA: Diagnosis not present

## 2015-08-10 DIAGNOSIS — Z79899 Other long term (current) drug therapy: Secondary | ICD-10-CM | POA: Diagnosis not present

## 2015-08-12 DIAGNOSIS — D509 Iron deficiency anemia, unspecified: Secondary | ICD-10-CM | POA: Diagnosis not present

## 2015-08-12 DIAGNOSIS — K3189 Other diseases of stomach and duodenum: Secondary | ICD-10-CM | POA: Diagnosis not present

## 2015-08-12 DIAGNOSIS — R790 Abnormal level of blood mineral: Secondary | ICD-10-CM | POA: Diagnosis not present

## 2015-08-15 DIAGNOSIS — D3A00 Benign carcinoid tumor of unspecified site: Secondary | ICD-10-CM | POA: Diagnosis not present

## 2015-08-15 DIAGNOSIS — C7A098 Malignant carcinoid tumors of other sites: Secondary | ICD-10-CM | POA: Diagnosis not present

## 2015-08-15 DIAGNOSIS — Z5111 Encounter for antineoplastic chemotherapy: Secondary | ICD-10-CM | POA: Diagnosis not present

## 2015-08-15 DIAGNOSIS — Z79899 Other long term (current) drug therapy: Secondary | ICD-10-CM | POA: Diagnosis not present

## 2015-08-22 DIAGNOSIS — K644 Residual hemorrhoidal skin tags: Secondary | ICD-10-CM | POA: Diagnosis not present

## 2015-08-22 DIAGNOSIS — K6289 Other specified diseases of anus and rectum: Secondary | ICD-10-CM | POA: Diagnosis not present

## 2015-08-22 DIAGNOSIS — M5135 Other intervertebral disc degeneration, thoracolumbar region: Secondary | ICD-10-CM | POA: Diagnosis not present

## 2015-08-22 DIAGNOSIS — R109 Unspecified abdominal pain: Secondary | ICD-10-CM | POA: Diagnosis not present

## 2015-08-22 DIAGNOSIS — R143 Flatulence: Secondary | ICD-10-CM | POA: Diagnosis not present

## 2015-08-22 DIAGNOSIS — R195 Other fecal abnormalities: Secondary | ICD-10-CM | POA: Diagnosis not present

## 2015-09-19 DIAGNOSIS — Z5111 Encounter for antineoplastic chemotherapy: Secondary | ICD-10-CM | POA: Diagnosis not present

## 2015-09-19 DIAGNOSIS — Z79899 Other long term (current) drug therapy: Secondary | ICD-10-CM | POA: Diagnosis not present

## 2015-09-19 DIAGNOSIS — D3A00 Benign carcinoid tumor of unspecified site: Secondary | ICD-10-CM | POA: Diagnosis not present

## 2015-09-19 DIAGNOSIS — C7A8 Other malignant neuroendocrine tumors: Secondary | ICD-10-CM | POA: Diagnosis not present

## 2015-09-20 DIAGNOSIS — M5137 Other intervertebral disc degeneration, lumbosacral region: Secondary | ICD-10-CM | POA: Diagnosis not present

## 2015-09-20 DIAGNOSIS — M545 Low back pain: Secondary | ICD-10-CM | POA: Diagnosis not present

## 2015-09-20 DIAGNOSIS — G894 Chronic pain syndrome: Secondary | ICD-10-CM | POA: Diagnosis not present

## 2015-10-13 DIAGNOSIS — D510 Vitamin B12 deficiency anemia due to intrinsic factor deficiency: Secondary | ICD-10-CM | POA: Diagnosis not present

## 2015-10-13 DIAGNOSIS — M79674 Pain in right toe(s): Secondary | ICD-10-CM | POA: Diagnosis not present

## 2015-10-13 DIAGNOSIS — R5383 Other fatigue: Secondary | ICD-10-CM | POA: Diagnosis not present

## 2015-10-13 DIAGNOSIS — D1779 Benign lipomatous neoplasm of other sites: Secondary | ICD-10-CM | POA: Diagnosis not present

## 2015-10-13 DIAGNOSIS — K649 Unspecified hemorrhoids: Secondary | ICD-10-CM | POA: Diagnosis not present

## 2015-10-13 DIAGNOSIS — M79675 Pain in left toe(s): Secondary | ICD-10-CM | POA: Diagnosis not present

## 2015-10-13 DIAGNOSIS — E611 Iron deficiency: Secondary | ICD-10-CM | POA: Diagnosis not present

## 2015-10-13 DIAGNOSIS — R5381 Other malaise: Secondary | ICD-10-CM | POA: Diagnosis not present

## 2015-10-17 DIAGNOSIS — M25561 Pain in right knee: Secondary | ICD-10-CM | POA: Diagnosis not present

## 2015-10-17 DIAGNOSIS — D3A00 Benign carcinoid tumor of unspecified site: Secondary | ICD-10-CM | POA: Diagnosis not present

## 2015-10-17 DIAGNOSIS — K828 Other specified diseases of gallbladder: Secondary | ICD-10-CM | POA: Diagnosis not present

## 2015-10-17 DIAGNOSIS — N281 Cyst of kidney, acquired: Secondary | ICD-10-CM | POA: Diagnosis not present

## 2015-10-17 DIAGNOSIS — J9811 Atelectasis: Secondary | ICD-10-CM | POA: Diagnosis not present

## 2015-10-17 DIAGNOSIS — Z96641 Presence of right artificial hip joint: Secondary | ICD-10-CM | POA: Diagnosis not present

## 2015-10-17 DIAGNOSIS — C787 Secondary malignant neoplasm of liver and intrahepatic bile duct: Secondary | ICD-10-CM | POA: Diagnosis not present

## 2015-10-17 DIAGNOSIS — I2510 Atherosclerotic heart disease of native coronary artery without angina pectoris: Secondary | ICD-10-CM | POA: Diagnosis not present

## 2015-10-17 DIAGNOSIS — L928 Other granulomatous disorders of the skin and subcutaneous tissue: Secondary | ICD-10-CM | POA: Diagnosis not present

## 2015-10-17 DIAGNOSIS — C7A8 Other malignant neuroendocrine tumors: Secondary | ICD-10-CM | POA: Diagnosis not present

## 2015-10-17 DIAGNOSIS — M159 Polyosteoarthritis, unspecified: Secondary | ICD-10-CM | POA: Diagnosis not present

## 2015-10-17 DIAGNOSIS — M5135 Other intervertebral disc degeneration, thoracolumbar region: Secondary | ICD-10-CM | POA: Diagnosis not present

## 2015-10-17 DIAGNOSIS — R1084 Generalized abdominal pain: Secondary | ICD-10-CM | POA: Diagnosis not present

## 2015-10-17 DIAGNOSIS — Z9689 Presence of other specified functional implants: Secondary | ICD-10-CM | POA: Diagnosis not present

## 2015-10-17 DIAGNOSIS — K7689 Other specified diseases of liver: Secondary | ICD-10-CM | POA: Diagnosis not present

## 2015-10-17 DIAGNOSIS — M4185 Other forms of scoliosis, thoracolumbar region: Secondary | ICD-10-CM | POA: Diagnosis not present

## 2015-10-17 DIAGNOSIS — M545 Low back pain: Secondary | ICD-10-CM | POA: Diagnosis not present

## 2015-10-17 DIAGNOSIS — Z79899 Other long term (current) drug therapy: Secondary | ICD-10-CM | POA: Diagnosis not present

## 2015-10-17 DIAGNOSIS — I700 Atherosclerosis of aorta: Secondary | ICD-10-CM | POA: Diagnosis not present

## 2015-10-17 DIAGNOSIS — Z5111 Encounter for antineoplastic chemotherapy: Secondary | ICD-10-CM | POA: Diagnosis not present

## 2015-10-17 DIAGNOSIS — G8929 Other chronic pain: Secondary | ICD-10-CM | POA: Diagnosis not present

## 2015-10-17 DIAGNOSIS — M150 Primary generalized (osteo)arthritis: Secondary | ICD-10-CM | POA: Diagnosis not present

## 2015-10-17 DIAGNOSIS — R590 Localized enlarged lymph nodes: Secondary | ICD-10-CM | POA: Diagnosis not present

## 2015-10-17 DIAGNOSIS — J984 Other disorders of lung: Secondary | ICD-10-CM | POA: Diagnosis not present

## 2015-10-17 DIAGNOSIS — Q272 Other congenital malformations of renal artery: Secondary | ICD-10-CM | POA: Diagnosis not present

## 2015-10-17 DIAGNOSIS — Z90710 Acquired absence of both cervix and uterus: Secondary | ICD-10-CM | POA: Diagnosis not present

## 2015-10-17 DIAGNOSIS — N200 Calculus of kidney: Secondary | ICD-10-CM | POA: Diagnosis not present

## 2015-10-18 DIAGNOSIS — K644 Residual hemorrhoidal skin tags: Secondary | ICD-10-CM | POA: Diagnosis not present

## 2015-10-18 DIAGNOSIS — R198 Other specified symptoms and signs involving the digestive system and abdomen: Secondary | ICD-10-CM | POA: Diagnosis not present

## 2015-10-20 DIAGNOSIS — D3A00 Benign carcinoid tumor of unspecified site: Secondary | ICD-10-CM | POA: Diagnosis not present

## 2015-10-31 DIAGNOSIS — D3A00 Benign carcinoid tumor of unspecified site: Secondary | ICD-10-CM | POA: Diagnosis not present

## 2015-11-03 DIAGNOSIS — D3A00 Benign carcinoid tumor of unspecified site: Secondary | ICD-10-CM | POA: Diagnosis not present

## 2015-11-03 DIAGNOSIS — C787 Secondary malignant neoplasm of liver and intrahepatic bile duct: Secondary | ICD-10-CM | POA: Diagnosis not present

## 2015-11-07 DIAGNOSIS — D3A00 Benign carcinoid tumor of unspecified site: Secondary | ICD-10-CM | POA: Diagnosis not present

## 2015-11-07 DIAGNOSIS — C7A011 Malignant carcinoid tumor of the jejunum: Secondary | ICD-10-CM | POA: Diagnosis not present

## 2015-11-07 DIAGNOSIS — C7B02 Secondary carcinoid tumors of liver: Secondary | ICD-10-CM | POA: Diagnosis not present

## 2015-11-14 DIAGNOSIS — C7B02 Secondary carcinoid tumors of liver: Secondary | ICD-10-CM | POA: Diagnosis not present

## 2015-11-14 DIAGNOSIS — D3A00 Benign carcinoid tumor of unspecified site: Secondary | ICD-10-CM | POA: Diagnosis not present

## 2015-11-23 DIAGNOSIS — C7A098 Malignant carcinoid tumors of other sites: Secondary | ICD-10-CM | POA: Diagnosis not present

## 2015-11-23 DIAGNOSIS — Z5111 Encounter for antineoplastic chemotherapy: Secondary | ICD-10-CM | POA: Diagnosis not present

## 2015-11-23 DIAGNOSIS — Z79899 Other long term (current) drug therapy: Secondary | ICD-10-CM | POA: Diagnosis not present

## 2015-11-23 DIAGNOSIS — D3A00 Benign carcinoid tumor of unspecified site: Secondary | ICD-10-CM | POA: Diagnosis not present

## 2015-12-21 DIAGNOSIS — D3A00 Benign carcinoid tumor of unspecified site: Secondary | ICD-10-CM | POA: Diagnosis not present

## 2015-12-21 DIAGNOSIS — C7A00 Malignant carcinoid tumor of unspecified site: Secondary | ICD-10-CM | POA: Diagnosis not present

## 2016-01-04 DIAGNOSIS — C7A00 Malignant carcinoid tumor of unspecified site: Secondary | ICD-10-CM | POA: Diagnosis not present

## 2016-01-04 DIAGNOSIS — D3A00 Benign carcinoid tumor of unspecified site: Secondary | ICD-10-CM | POA: Diagnosis not present

## 2016-01-04 DIAGNOSIS — D3A8 Other benign neuroendocrine tumors: Secondary | ICD-10-CM | POA: Diagnosis not present

## 2016-01-10 DIAGNOSIS — Z5181 Encounter for therapeutic drug level monitoring: Secondary | ICD-10-CM | POA: Diagnosis not present

## 2016-01-10 DIAGNOSIS — R1084 Generalized abdominal pain: Secondary | ICD-10-CM | POA: Diagnosis not present

## 2016-01-10 DIAGNOSIS — M150 Primary generalized (osteo)arthritis: Secondary | ICD-10-CM | POA: Diagnosis not present

## 2016-01-10 DIAGNOSIS — M545 Low back pain: Secondary | ICD-10-CM | POA: Diagnosis not present

## 2016-01-10 DIAGNOSIS — G8929 Other chronic pain: Secondary | ICD-10-CM | POA: Diagnosis not present

## 2016-01-10 DIAGNOSIS — Z79899 Other long term (current) drug therapy: Secondary | ICD-10-CM | POA: Diagnosis not present

## 2016-01-10 DIAGNOSIS — M25561 Pain in right knee: Secondary | ICD-10-CM | POA: Diagnosis not present

## 2016-01-18 DIAGNOSIS — C7A00 Malignant carcinoid tumor of unspecified site: Secondary | ICD-10-CM | POA: Diagnosis not present

## 2016-01-18 DIAGNOSIS — Z5111 Encounter for antineoplastic chemotherapy: Secondary | ICD-10-CM | POA: Diagnosis not present

## 2016-01-18 DIAGNOSIS — Z79899 Other long term (current) drug therapy: Secondary | ICD-10-CM | POA: Diagnosis not present

## 2016-01-18 DIAGNOSIS — D3A00 Benign carcinoid tumor of unspecified site: Secondary | ICD-10-CM | POA: Diagnosis not present

## 2016-01-18 DIAGNOSIS — D3A8 Other benign neuroendocrine tumors: Secondary | ICD-10-CM | POA: Diagnosis not present

## 2016-02-02 DIAGNOSIS — F419 Anxiety disorder, unspecified: Secondary | ICD-10-CM | POA: Diagnosis not present

## 2016-02-02 DIAGNOSIS — Z6835 Body mass index (BMI) 35.0-35.9, adult: Secondary | ICD-10-CM | POA: Diagnosis not present

## 2016-02-02 DIAGNOSIS — G894 Chronic pain syndrome: Secondary | ICD-10-CM | POA: Diagnosis not present

## 2016-02-02 DIAGNOSIS — K219 Gastro-esophageal reflux disease without esophagitis: Secondary | ICD-10-CM | POA: Diagnosis not present

## 2016-02-13 DIAGNOSIS — K769 Liver disease, unspecified: Secondary | ICD-10-CM | POA: Diagnosis not present

## 2016-02-13 DIAGNOSIS — D3A00 Benign carcinoid tumor of unspecified site: Secondary | ICD-10-CM | POA: Diagnosis not present

## 2016-02-13 DIAGNOSIS — N281 Cyst of kidney, acquired: Secondary | ICD-10-CM | POA: Diagnosis not present

## 2016-02-16 DIAGNOSIS — D3A8 Other benign neuroendocrine tumors: Secondary | ICD-10-CM | POA: Diagnosis not present

## 2016-02-22 DIAGNOSIS — D3A00 Benign carcinoid tumor of unspecified site: Secondary | ICD-10-CM | POA: Diagnosis not present

## 2016-02-22 DIAGNOSIS — C7A00 Malignant carcinoid tumor of unspecified site: Secondary | ICD-10-CM | POA: Diagnosis not present

## 2016-02-22 DIAGNOSIS — C787 Secondary malignant neoplasm of liver and intrahepatic bile duct: Secondary | ICD-10-CM | POA: Diagnosis not present

## 2016-02-29 DIAGNOSIS — D3A00 Benign carcinoid tumor of unspecified site: Secondary | ICD-10-CM | POA: Diagnosis not present

## 2016-02-29 DIAGNOSIS — C7A00 Malignant carcinoid tumor of unspecified site: Secondary | ICD-10-CM | POA: Diagnosis not present

## 2016-03-06 DIAGNOSIS — C7B8 Other secondary neuroendocrine tumors: Secondary | ICD-10-CM | POA: Diagnosis not present

## 2016-03-06 DIAGNOSIS — C787 Secondary malignant neoplasm of liver and intrahepatic bile duct: Secondary | ICD-10-CM | POA: Diagnosis not present

## 2016-03-07 DIAGNOSIS — C787 Secondary malignant neoplasm of liver and intrahepatic bile duct: Secondary | ICD-10-CM | POA: Diagnosis not present

## 2016-03-07 DIAGNOSIS — D3A00 Benign carcinoid tumor of unspecified site: Secondary | ICD-10-CM | POA: Diagnosis not present

## 2016-03-07 DIAGNOSIS — K769 Liver disease, unspecified: Secondary | ICD-10-CM | POA: Diagnosis not present

## 2016-03-07 DIAGNOSIS — Z483 Aftercare following surgery for neoplasm: Secondary | ICD-10-CM | POA: Diagnosis not present

## 2016-03-12 ENCOUNTER — Inpatient Hospital Stay (HOSPITAL_COMMUNITY)
Admission: EM | Admit: 2016-03-12 | Discharge: 2016-03-14 | DRG: 872 | Disposition: A | Discharge status: Home / Self Care | Payer: Medicare Other | Attending: Internal Medicine | Admitting: Internal Medicine

## 2016-03-12 ENCOUNTER — Emergency Department (HOSPITAL_COMMUNITY): Payer: Medicare Other

## 2016-03-12 ENCOUNTER — Encounter (HOSPITAL_COMMUNITY): Payer: Self-pay | Admitting: *Deleted

## 2016-03-12 DIAGNOSIS — I9589 Other hypotension: Secondary | ICD-10-CM | POA: Diagnosis not present

## 2016-03-12 DIAGNOSIS — A419 Sepsis, unspecified organism: Secondary | ICD-10-CM | POA: Diagnosis not present

## 2016-03-12 DIAGNOSIS — Z9049 Acquired absence of other specified parts of digestive tract: Secondary | ICD-10-CM | POA: Diagnosis not present

## 2016-03-12 DIAGNOSIS — N390 Urinary tract infection, site not specified: Secondary | ICD-10-CM

## 2016-03-12 DIAGNOSIS — E876 Hypokalemia: Secondary | ICD-10-CM | POA: Diagnosis not present

## 2016-03-12 DIAGNOSIS — R410 Disorientation, unspecified: Secondary | ICD-10-CM | POA: Diagnosis not present

## 2016-03-12 DIAGNOSIS — Z961 Presence of intraocular lens: Secondary | ICD-10-CM | POA: Diagnosis present

## 2016-03-12 DIAGNOSIS — Z9841 Cataract extraction status, right eye: Secondary | ICD-10-CM | POA: Diagnosis not present

## 2016-03-12 DIAGNOSIS — D1803 Hemangioma of intra-abdominal structures: Secondary | ICD-10-CM | POA: Diagnosis present

## 2016-03-12 DIAGNOSIS — I959 Hypotension, unspecified: Secondary | ICD-10-CM | POA: Diagnosis present

## 2016-03-12 DIAGNOSIS — S199XXA Unspecified injury of neck, initial encounter: Secondary | ICD-10-CM | POA: Diagnosis not present

## 2016-03-12 DIAGNOSIS — K219 Gastro-esophageal reflux disease without esophagitis: Secondary | ICD-10-CM | POA: Diagnosis present

## 2016-03-12 DIAGNOSIS — G4730 Sleep apnea, unspecified: Secondary | ICD-10-CM | POA: Diagnosis present

## 2016-03-12 DIAGNOSIS — M545 Low back pain: Secondary | ICD-10-CM | POA: Diagnosis present

## 2016-03-12 DIAGNOSIS — K589 Irritable bowel syndrome without diarrhea: Secondary | ICD-10-CM | POA: Diagnosis present

## 2016-03-12 DIAGNOSIS — I509 Heart failure, unspecified: Secondary | ICD-10-CM | POA: Diagnosis present

## 2016-03-12 DIAGNOSIS — Z888 Allergy status to other drugs, medicaments and biological substances status: Secondary | ICD-10-CM | POA: Diagnosis not present

## 2016-03-12 DIAGNOSIS — R7989 Other specified abnormal findings of blood chemistry: Secondary | ICD-10-CM | POA: Diagnosis present

## 2016-03-12 DIAGNOSIS — Z79899 Other long term (current) drug therapy: Secondary | ICD-10-CM

## 2016-03-12 DIAGNOSIS — R531 Weakness: Secondary | ICD-10-CM | POA: Diagnosis not present

## 2016-03-12 DIAGNOSIS — E871 Hypo-osmolality and hyponatremia: Secondary | ICD-10-CM | POA: Diagnosis present

## 2016-03-12 DIAGNOSIS — Z823 Family history of stroke: Secondary | ICD-10-CM

## 2016-03-12 DIAGNOSIS — M6281 Muscle weakness (generalized): Secondary | ICD-10-CM

## 2016-03-12 DIAGNOSIS — R945 Abnormal results of liver function studies: Secondary | ICD-10-CM | POA: Diagnosis present

## 2016-03-12 DIAGNOSIS — Z809 Family history of malignant neoplasm, unspecified: Secondary | ICD-10-CM

## 2016-03-12 DIAGNOSIS — Z9842 Cataract extraction status, left eye: Secondary | ICD-10-CM

## 2016-03-12 DIAGNOSIS — M797 Fibromyalgia: Secondary | ICD-10-CM | POA: Diagnosis present

## 2016-03-12 DIAGNOSIS — R710 Precipitous drop in hematocrit: Secondary | ICD-10-CM | POA: Diagnosis present

## 2016-03-12 DIAGNOSIS — Z96651 Presence of right artificial knee joint: Secondary | ICD-10-CM | POA: Diagnosis present

## 2016-03-12 DIAGNOSIS — E039 Hypothyroidism, unspecified: Secondary | ICD-10-CM | POA: Diagnosis present

## 2016-03-12 DIAGNOSIS — G8929 Other chronic pain: Secondary | ICD-10-CM | POA: Diagnosis present

## 2016-03-12 DIAGNOSIS — I10 Essential (primary) hypertension: Secondary | ICD-10-CM | POA: Diagnosis present

## 2016-03-12 DIAGNOSIS — R310 Gross hematuria: Secondary | ICD-10-CM | POA: Diagnosis not present

## 2016-03-12 DIAGNOSIS — Z90710 Acquired absence of both cervix and uterus: Secondary | ICD-10-CM | POA: Diagnosis not present

## 2016-03-12 DIAGNOSIS — C229 Malignant neoplasm of liver, not specified as primary or secondary: Secondary | ICD-10-CM | POA: Diagnosis present

## 2016-03-12 DIAGNOSIS — E860 Dehydration: Secondary | ICD-10-CM | POA: Diagnosis present

## 2016-03-12 DIAGNOSIS — N3001 Acute cystitis with hematuria: Secondary | ICD-10-CM | POA: Diagnosis present

## 2016-03-12 DIAGNOSIS — Z96641 Presence of right artificial hip joint: Secondary | ICD-10-CM | POA: Diagnosis present

## 2016-03-12 DIAGNOSIS — F418 Other specified anxiety disorders: Secondary | ICD-10-CM | POA: Diagnosis present

## 2016-03-12 DIAGNOSIS — I110 Hypertensive heart disease with heart failure: Secondary | ICD-10-CM | POA: Diagnosis present

## 2016-03-12 DIAGNOSIS — S0990XA Unspecified injury of head, initial encounter: Secondary | ICD-10-CM | POA: Diagnosis not present

## 2016-03-12 HISTORY — DX: Nausea with vomiting, unspecified: Z98.890

## 2016-03-12 HISTORY — DX: Other specified postprocedural states: R11.2

## 2016-03-12 LAB — COMPREHENSIVE METABOLIC PANEL
ALBUMIN: 2.8 g/dL — AB (ref 3.5–5.0)
ALT: 81 U/L — AB (ref 14–54)
AST: 58 U/L — AB (ref 15–41)
Alkaline Phosphatase: 173 U/L — ABNORMAL HIGH (ref 38–126)
Anion gap: 11 (ref 5–15)
BUN: 17 mg/dL (ref 6–20)
CHLORIDE: 96 mmol/L — AB (ref 101–111)
CO2: 24 mmol/L (ref 22–32)
CREATININE: 0.87 mg/dL (ref 0.44–1.00)
Calcium: 7.9 mg/dL — ABNORMAL LOW (ref 8.9–10.3)
GFR calc Af Amer: >60 mL/min (ref >60)
GFR calc non Af Amer: >60 mL/min (ref >60)
Glucose, Bld: 128 mg/dL — ABNORMAL HIGH (ref 65–99)
Potassium: 2.9 mmol/L — ABNORMAL LOW (ref 3.5–5.1)
SODIUM: 131 mmol/L — AB (ref 135–145)
Total Bilirubin: 2.4 mg/dL — ABNORMAL HIGH (ref 0.3–1.2)
Total Protein: 6.7 g/dL (ref 6.5–8.1)

## 2016-03-12 LAB — CBC WITH DIFFERENTIAL/PLATELET
BASOS ABS: 0 10*3/uL (ref 0.0–0.1)
BASOS PCT: 0 %
EOS ABS: 0 10*3/uL (ref 0.0–0.7)
EOS PCT: 0 %
HCT: 35.2 % — ABNORMAL LOW (ref 36.0–46.0)
Hemoglobin: 11.8 g/dL — ABNORMAL LOW (ref 12.0–15.0)
LYMPHS PCT: 3 %
Lymphs Abs: 0.5 10*3/uL — ABNORMAL LOW (ref 0.7–4.0)
MCH: 29.1 pg (ref 26.0–34.0)
MCHC: 33.5 g/dL (ref 30.0–36.0)
MCV: 86.7 fL (ref 78.0–100.0)
Monocytes Absolute: 0.8 10*3/uL (ref 0.1–1.0)
Monocytes Relative: 5 %
Neutro Abs: 13.8 10*3/uL — ABNORMAL HIGH (ref 1.7–7.7)
Neutrophils Relative %: 92 %
PLATELETS: 753 10*3/uL — AB (ref 150–400)
RBC: 4.06 MIL/uL (ref 3.87–5.11)
RDW: 14.5 % (ref 11.5–15.5)
WBC: 15.1 10*3/uL — AB (ref 4.0–10.5)

## 2016-03-12 LAB — URINALYSIS, MICROSCOPIC (REFLEX)

## 2016-03-12 LAB — URINALYSIS, ROUTINE W REFLEX MICROSCOPIC
GLUCOSE, UA: NEGATIVE mg/dL
Ketones, ur: 15 mg/dL — AB
Nitrite: POSITIVE — AB
pH: 6 (ref 5.0–8.0)

## 2016-03-12 LAB — LIPASE, BLOOD: Lipase: 13 U/L (ref 11–51)

## 2016-03-12 LAB — MAGNESIUM: Magnesium: 2.2 mg/dL (ref 1.7–2.4)

## 2016-03-12 LAB — TROPONIN I

## 2016-03-12 MED ORDER — ALPRAZOLAM 1 MG PO TABS: 2.0000 mg | ORAL_TABLET | Freq: Every evening | ORAL | Status: DC | PRN

## 2016-03-12 MED ORDER — POTASSIUM CHLORIDE CRYS ER 20 MEQ PO TBCR
40.0000 meq | EXTENDED_RELEASE_TABLET | Freq: Once | ORAL | Status: AC
  Administered 2016-03-12: 40 meq via ORAL
  Filled 2016-03-12: qty 2

## 2016-03-12 MED ORDER — IOPAMIDOL (ISOVUE-300) INJECTION 61%
100.0000 mL | Freq: Once | INTRAVENOUS | Status: AC | PRN
  Administered 2016-03-12: 100 mL via INTRAVENOUS

## 2016-03-12 MED ORDER — CARBOXYMETHYLCELLULOSE SODIUM 0.5 % OP SOLN: 1.0000 [drp] | Freq: Three times a day (TID) | OPHTHALMIC | Status: DC | PRN

## 2016-03-12 MED ORDER — SODIUM CHLORIDE 0.9 % IV BOLUS (SEPSIS)
1000.0000 mL | Freq: Once | INTRAVENOUS | Status: AC
  Administered 2016-03-12: 1000 mL via INTRAVENOUS

## 2016-03-12 MED ORDER — DICYCLOMINE HCL 20 MG PO TABS
20.0000 mg | ORAL_TABLET | Freq: Four times a day (QID) | ORAL | Status: DC
  Filled 2016-03-12 (×7): qty 1

## 2016-03-12 MED ORDER — DEXTROSE 5 % IV SOLN
1.0000 g | Freq: Once | INTRAVENOUS | Status: AC
  Administered 2016-03-12: 1 g via INTRAVENOUS
  Filled 2016-03-12: qty 10

## 2016-03-12 MED ORDER — LEVOTHYROXINE SODIUM 75 MCG PO TABS
75.0000 ug | ORAL_TABLET | Freq: Every day | ORAL | Status: DC
  Administered 2016-03-13 – 2016-03-14 (×2): 75 ug via ORAL
  Filled 2016-03-12 (×2): qty 1

## 2016-03-12 MED ORDER — ESCITALOPRAM OXALATE 10 MG PO TABS
20.0000 mg | ORAL_TABLET | Freq: Every day | ORAL | Status: DC
  Administered 2016-03-13 – 2016-03-14 (×2): 20 mg via ORAL
  Filled 2016-03-12 (×2): qty 2

## 2016-03-12 MED ORDER — COLESEVELAM HCL 625 MG PO TABS
1875.0000 mg | ORAL_TABLET | Freq: Two times a day (BID) | ORAL | Status: DC
  Administered 2016-03-13 – 2016-03-14 (×3): 1875 mg via ORAL
  Filled 2016-03-12 (×7): qty 3

## 2016-03-12 MED ORDER — DOXEPIN HCL 25 MG PO CAPS
75.0000 mg | ORAL_CAPSULE | Freq: Every day | ORAL | Status: DC
  Administered 2016-03-12 – 2016-03-13 (×2): 75 mg via ORAL
  Filled 2016-03-12 (×2): qty 3

## 2016-03-12 MED ORDER — POTASSIUM CHLORIDE IN NACL 40-0.9 MEQ/L-% IV SOLN
INTRAVENOUS | Status: DC
  Administered 2016-03-12: 125 mL/h via INTRAVENOUS
  Administered 2016-03-13: 75 mL/h via INTRAVENOUS
  Administered 2016-03-13: 125 mL/h via INTRAVENOUS

## 2016-03-12 MED ORDER — DEXTROSE 5 % IV SOLN
1.0000 g | INTRAVENOUS | Status: DC
  Administered 2016-03-13: 1 g via INTRAVENOUS
  Filled 2016-03-12 (×2): qty 10

## 2016-03-12 MED ORDER — PANTOPRAZOLE SODIUM 40 MG PO TBEC
40.0000 mg | DELAYED_RELEASE_TABLET | Freq: Every day | ORAL | Status: DC
  Administered 2016-03-12 – 2016-03-13 (×2): 40 mg via ORAL
  Filled 2016-03-12 (×2): qty 1

## 2016-03-12 MED ORDER — ALUM & MAG HYDROXIDE-SIMETH 200-200-20 MG/5ML PO SUSP: 30.0000 mL | Freq: Every day | ORAL | Status: DC | PRN

## 2016-03-12 MED ORDER — DICYCLOMINE HCL 10 MG PO CAPS
20.0000 mg | ORAL_CAPSULE | Freq: Four times a day (QID) | ORAL | Status: DC
  Administered 2016-03-12 – 2016-03-14 (×5): 20 mg via ORAL
  Filled 2016-03-12 (×4): qty 2

## 2016-03-12 MED ORDER — POLYVINYL ALCOHOL 1.4 % OP SOLN: 1.0000 [drp] | Freq: Three times a day (TID) | OPHTHALMIC | Status: DC | PRN

## 2016-03-12 MED ORDER — HYDROMORPHONE HCL 4 MG PO TABS
4.0000 mg | ORAL_TABLET | Freq: Four times a day (QID) | ORAL | Status: DC | PRN
Start: 2016-03-12 — End: 2016-03-14
  Administered 2016-03-12 – 2016-03-14 (×4): 4 mg via ORAL
  Filled 2016-03-12 (×4): qty 1

## 2016-03-12 NOTE — ED Triage Notes (Signed)
Pt comes in by EMS from home. Pt hd a fall on Thursday and Sunday. Pt has been having blood in her urine starting Friday. She recently had a liver procedure related to her terminal cancer. She has had weakness Thursday. Pt alert and oriented.
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
## 2016-03-12 NOTE — ED Notes (Signed)
Pt unable to give urine specimen at this time 

## 2016-03-12 NOTE — ED Notes (Signed)
ED Provider at bedside. 

## 2016-03-13 DIAGNOSIS — R7989 Other specified abnormal findings of blood chemistry: Secondary | ICD-10-CM

## 2016-03-13 DIAGNOSIS — E876 Hypokalemia: Secondary | ICD-10-CM

## 2016-03-13 DIAGNOSIS — N390 Urinary tract infection, site not specified: Secondary | ICD-10-CM

## 2016-03-13 DIAGNOSIS — E871 Hypo-osmolality and hyponatremia: Secondary | ICD-10-CM

## 2016-03-13 LAB — COMPREHENSIVE METABOLIC PANEL
ALT: 51 U/L (ref 14–54)
AST: 32 U/L (ref 15–41)
Albumin: 2.2 g/dL — ABNORMAL LOW (ref 3.5–5.0)
Alkaline Phosphatase: 172 U/L — ABNORMAL HIGH (ref 38–126)
Anion gap: 5 (ref 5–15)
BUN: 10 mg/dL (ref 6–20)
CHLORIDE: 102 mmol/L (ref 101–111)
CO2: 26 mmol/L (ref 22–32)
CREATININE: 0.64 mg/dL (ref 0.44–1.00)
Calcium: 7.4 mg/dL — ABNORMAL LOW (ref 8.9–10.3)
GFR calc Af Amer: >60 mL/min (ref >60)
GFR calc non Af Amer: >60 mL/min (ref >60)
Glucose, Bld: 129 mg/dL — ABNORMAL HIGH (ref 65–99)
POTASSIUM: 4.1 mmol/L (ref 3.5–5.1)
SODIUM: 133 mmol/L — AB (ref 135–145)
Total Bilirubin: 1.3 mg/dL — ABNORMAL HIGH (ref 0.3–1.2)
Total Protein: 5.3 g/dL — ABNORMAL LOW (ref 6.5–8.1)

## 2016-03-13 LAB — CBC WITH DIFFERENTIAL/PLATELET
BASOS ABS: 0 10*3/uL (ref 0.0–0.1)
Basophils Relative: 0 %
EOS ABS: 0.1 10*3/uL (ref 0.0–0.7)
Eosinophils Relative: 1 %
HCT: 30.4 % — ABNORMAL LOW (ref 36.0–46.0)
Hemoglobin: 9.9 g/dL — ABNORMAL LOW (ref 12.0–15.0)
Lymphocytes Relative: 5 %
Lymphs Abs: 0.7 10*3/uL (ref 0.7–4.0)
MCH: 28.5 pg (ref 26.0–34.0)
MCHC: 32.6 g/dL (ref 30.0–36.0)
MCV: 87.6 fL (ref 78.0–100.0)
MONO ABS: 1 10*3/uL (ref 0.1–1.0)
Monocytes Relative: 7 %
Neutro Abs: 12.6 10*3/uL — ABNORMAL HIGH (ref 1.7–7.7)
Neutrophils Relative %: 87 %
PLATELETS: 726 10*3/uL — AB (ref 150–400)
RBC: 3.47 MIL/uL — AB (ref 3.87–5.11)
RDW: 14.7 % (ref 11.5–15.5)
WBC: 14.4 10*3/uL — AB (ref 4.0–10.5)

## 2016-03-13 LAB — CK: CK TOTAL: 102 U/L (ref 38–234)

## 2016-03-13 MED ORDER — POTASSIUM CHLORIDE IN NACL 40-0.9 MEQ/L-% IV SOLN
INTRAVENOUS | Status: AC
  Filled 2016-03-13: qty 1000

## 2016-03-13 MED ORDER — PSYLLIUM 95 % PO PACK
1.0000 | PACK | Freq: Three times a day (TID) | ORAL | Status: DC
  Administered 2016-03-13 – 2016-03-14 (×4): 1 via ORAL
  Filled 2016-03-13 (×10): qty 1

## 2016-03-13 NOTE — Progress Notes (Addendum)
PROGRESS NOTE    Margaret Cowan  [id number] DOB: Sep 27, 1950 DOA: 03/12/2016 PCP: Glo Herring., MD   Brief Narrative: 65 y.o. female with medical history significant of anemia, anxiety, osteoarthritis, hepatic hemangioma, cancer, chronic lower back pain, chronic constipation, internal hemorrhoids, GERD, headache, hyperlipidemia, hypertension, hypothyroidism, peripheral neuropathy, IBS, sleep apnea on CPAP who is coming to the emergency department due to hematuria since Friday. Per patient, she underwent an embolization of the liver lesions at [facility] about a week ago. Things were uneventful during the procedure and she was discharged home. In the ER patient was found to have UTI, hypokalemia admitted for further evaluation. Assessment & Plan:   # Acute cystitis with hematuria (hematuria as per pt) -Reportedly, patient admitted for hematuria. On UA, there is no RBC. CK was checked which was unremarkable. As per nursing staff patient has clear colored urine. Wondering if it is resolved. -Probable sepsis on admission as per medical record. Follow up culture results. Continue ceftriaxone. -Drop in hemoglobin today likely dilutional. As there is no hematuria or any other bleeding sign. Repeat CBC in the morning.    # HEMANGIOMA, HEPATIC: Reportedly, patient had embolization procedure for liver cancer about a week at outside hospital. CT scan of abdomen showed enlargement of hepatitic masses consistent with worsening metastatic disease. I recommended patient to follow-up with her oncologist and surgeon at [facility]. -She has mild transaminitis.  #  Essential hypertension: Blood pressure low. Continue to hold amlodipine and lisinopril. Monitor blood pressure closely. Currently on IV fluid.  # Hyponatremia, hypokalemia: serum electrolytes improving. Continue IV hydration and repletion of electrolytes. Monitor labs.    # GERD: On Protonix. -Metamucil for constipation    #  Hypothyroidism: Continue Synthroid.  #Fall: CT scan of head and spine unremarkable. PT OT evaluation.    DVT prophylaxis: SCDs. Holding anticoagulation because of recent surgery Code Status: Full code Family Communication: No family present at bedside Disposition Plan: Likely discharge home in 1-2 days.    Consultants:   None  Procedures: None Antimicrobials: Ceftriaxone  Subjective: Patient was seen and examined at bedside. Reported feeling weakness and tired. Denied headache, dizziness, nausea, vomiting, chest pain or shortness of breath. No new event. Reported constipation.   Objective: Vitals:   03/12/16 1800 03/12/16 1958 03/13/16 0552 03/13/16 1352  BP: 108/55 (!) 101/53 (!) 99/59 (!) 108/58  Pulse: 75 74 65 71  Resp: 16 16 16 18   Temp:  98.3 F (36.8 C) 98.4 F (36.9 C) 98.6 F (37 C)  TempSrc:  Oral Oral Oral  SpO2: 93% 95% 96% 98%  Weight:  89.3 kg (196 lb 13.9 oz)    Height:  5\' 4"  (1.626 m)      Intake/Output Summary (Last 24 hours) at 03/13/16 1618 Last data filed at 03/13/16 1550  Gross per 24 hour  Intake             1180 ml  Output              650 ml  Net              530 ml   Filed Weights   03/12/16 1119 03/12/16 1958  Weight: 90.7 kg (200 lb) 89.3 kg (196 lb 13.9 oz)    Examination:  General exam: Appears calm and comfortable  Respiratory system: Clear to auscultation. Respiratory effort normal. No wheezing or crackle Cardiovascular system: S1 & S2 heard, RRR.  No pedal edema. Gastrointestinal system: Abdomen is nondistended, soft and nontender.  Normal bowel sounds heard. Central nervous system: Alert and oriented. No focal neurological deficits. Extremities: Symmetric 5 x 5 power. Skin: No rashes, lesions or ulcers Psychiatry: Judgement and insight appear normal. Mood & affect appropriate.     Data Reviewed: I have personally reviewed following labs and imaging studies  CBC:  Recent Labs Lab 03/12/16 1155 03/13/16 0542  WBC  15.1* 14.4*  NEUTROABS 13.8* 12.6*  HGB 11.8* 9.9*  HCT 35.2* 30.4*  MCV 86.7 87.6  PLT 753* [id number]*   Basic Metabolic Panel:  Recent Labs Lab 03/12/16 1155 03/12/16 1348 03/13/16 0542  NA 131*  --  133*  K 2.9*  --  4.1  CL 96*  --  102  CO2 24  --  26  GLUCOSE 128*  --  129*  BUN 17  --  10  CREATININE 0.87  --  0.64  CALCIUM 7.9*  --  7.4*  MG  --  2.2  --    GFR: Estimated Creatinine Clearance: 74.8 mL/min (by C-G formula based on SCr of 0.64 mg/dL). Liver Function Tests:  Recent Labs Lab 03/12/16 1155 03/13/16 0542  AST 58* 32  ALT 81* 51  ALKPHOS 173* 172*  BILITOT 2.4* 1.3*  PROT 6.7 5.3*  ALBUMIN 2.8* 2.2*    Recent Labs Lab 03/12/16 1155  LIPASE 13   No results for input(s): AMMONIA in the last 168 hours. Coagulation Profile: No results for input(s): INR, PROTIME in the last 168 hours. Cardiac Enzymes:  Recent Labs Lab 03/12/16 1249 03/13/16 0643  CKTOTAL  --  102  TROPONINI <0.03  --    BNP (last 3 results) No results for input(s): PROBNP in the last 8760 hours. HbA1C: No results for input(s): HGBA1C in the last 72 hours. CBG: No results for input(s): GLUCAP in the last 168 hours. Lipid Profile: No results for input(s): CHOL, HDL, LDLCALC, TRIG, CHOLHDL, LDLDIRECT in the last 72 hours. Thyroid Function Tests: No results for input(s): TSH, T4TOTAL, FREET4, T3FREE, THYROIDAB in the last 72 hours. Anemia Panel: No results for input(s): VITAMINB12, FOLATE, FERRITIN, TIBC, IRON, RETICCTPCT in the last 72 hours. Sepsis Labs: No results for input(s): PROCALCITON, LATICACIDVEN in the last 168 hours.  Recent Results (from the past 240 hour(s))  Culture, blood (x 2)     Status: None (Preliminary result)   Collection Time: 03/13/16 12:30 AM  Result Value Ref Range Status   Specimen Description LEFT ANTECUBITAL  Final   Special Requests BOTTLES DRAWN AEROBIC AND ANAEROBIC [place]  Final   Culture PENDING  Incomplete   Report Status PENDING   Incomplete  Culture, blood (x 2)     Status: None (Preliminary result)   Collection Time: 03/13/16 12:44 AM  Result Value Ref Range Status   Specimen Description BLOOD LEFT FOREARM  Final   Special Requests BOTTLES DRAWN AEROBIC AND ANAEROBIC [place]  Final   Culture PENDING  Incomplete   Report Status PENDING  Incomplete         Radiology Studies: Dg Chest 2 View  Result Date: 03/12/2016 CLINICAL DATA:  Carcinoid, generalized weakness and fatigue for 4 days. EXAM: CHEST  2 VIEW COMPARISON:  12/10/2012 FINDINGS: Heart and mediastinal contours are within normal limits. No focal opacities or effusions. No acute bony abnormality. IMPRESSION: No active cardiopulmonary disease. Electronically Signed   By: Rolm Baptise M.D.   On: 03/12/2016 13:26   Ct Head Wo Contrast  Result Date: 03/12/2016 CLINICAL DATA:  Multiple falls x1 week, confusion  EXAM: CT HEAD WITHOUT CONTRAST CT CERVICAL SPINE WITHOUT CONTRAST TECHNIQUE: Multidetector CT imaging of the head and cervical spine was performed following the standard protocol without intravenous contrast. Multiplanar CT image reconstructions of the cervical spine were also generated. COMPARISON:  CT cervical spine dated 12/19/2012 FINDINGS: CT HEAD FINDINGS Brain: No evidence of acute infarction, hemorrhage, hydrocephalus, extra-axial collection or mass lesion/mass effect. Vascular: Mild intracranial atherosclerosis. Skull: Normal. Negative for fracture or focal lesion. Sinuses/Orbits: The visualized paranasal sinuses are essentially clear. The mastoid air cells are unopacified. Bilateral globes and retroconal soft tissues are within normal limits. Other: Cerebral volume is within normal limits. No ventriculomegaly. CT CERVICAL SPINE FINDINGS Alignment: Normal cervical lordosis. Skull base and vertebrae: No acute fracture. Status post C3-5 ACDF, without evidence of complication. No primary bone lesion or focal pathologic process. Soft tissues and spinal  canal: No prevertebral fluid or swelling. No visible canal hematoma. Disc levels:  Spinal canal is patent. Mild multilevel degenerative changes. Upper chest: Visualized lung apices are clear. Other: Visualized thyroid is unremarkable. IMPRESSION: Normal head CT. No evidence of traumatic injury to the cervical spine. Status post C3-5 ACDF, without evidence of complication. Electronically Signed   By: Julian Hy M.D.   On: 03/12/2016 14:09   Ct Cervical Spine Wo Contrast  Result Date: 03/12/2016 CLINICAL DATA:  Multiple falls x1 week, confusion EXAM: CT HEAD WITHOUT CONTRAST CT CERVICAL SPINE WITHOUT CONTRAST TECHNIQUE: Multidetector CT imaging of the head and cervical spine was performed following the standard protocol without intravenous contrast. Multiplanar CT image reconstructions of the cervical spine were also generated. COMPARISON:  CT cervical spine dated 12/19/2012 FINDINGS: CT HEAD FINDINGS Brain: No evidence of acute infarction, hemorrhage, hydrocephalus, extra-axial collection or mass lesion/mass effect. Vascular: Mild intracranial atherosclerosis. Skull: Normal. Negative for fracture or focal lesion. Sinuses/Orbits: The visualized paranasal sinuses are essentially clear. The mastoid air cells are unopacified. Bilateral globes and retroconal soft tissues are within normal limits. Other: Cerebral volume is within normal limits. No ventriculomegaly. CT CERVICAL SPINE FINDINGS Alignment: Normal cervical lordosis. Skull base and vertebrae: No acute fracture. Status post C3-5 ACDF, without evidence of complication. No primary bone lesion or focal pathologic process. Soft tissues and spinal canal: No prevertebral fluid or swelling. No visible canal hematoma. Disc levels:  Spinal canal is patent. Mild multilevel degenerative changes. Upper chest: Visualized lung apices are clear. Other: Visualized thyroid is unremarkable. IMPRESSION: Normal head CT. No evidence of traumatic injury to the cervical  spine. Status post C3-5 ACDF, without evidence of complication. Electronically Signed   By: Julian Hy M.D.   On: 03/12/2016 14:09   Ct Abdomen Pelvis W Contrast  Result Date: 03/12/2016 CLINICAL DATA:  Multiple falls, gross hematuria. EXAM: CT ABDOMEN AND PELVIS WITH CONTRAST TECHNIQUE: Multidetector CT imaging of the abdomen and pelvis was performed using the standard protocol following bolus administration of intravenous contrast. CONTRAST:  141mL ISOVUE-300 IOPAMIDOL (ISOVUE-300) INJECTION 61% COMPARISON:  CT scan of September 24, 2014. FINDINGS: Lower chest: No acute abnormality. Hepatobiliary: Status post cholecystectomy. Large cyst seen inferior to right hepatic lobe is no longer present. Irregular it high density is seen in the anterior segment of right hepatic low which may be due to embolization procedure. Multiple hepatic masses are noted which appear to be increased in size compared to prior exam, consistent with worsening metastases. The largest measures 5.3 x 4.7 cm arising superiorly from the left hepatic lobe, which is significantly enlarged compared to prior exam 3.5 x 2.8 cm lesion is  noted in caudate lobe which is enlarged compared to prior exam 4.0 x 2.7 cm lesion is noted in dome of right hepatic lobe which is enlarged compared to prior exam. Pancreas: Unremarkable. No pancreatic ductal dilatation or surrounding inflammatory changes. Spleen: Normal in size without focal abnormality. Adrenals/Urinary Tract: Adrenal glands appear normal. Stable bilateral renal cysts are noted. No hydronephrosis or renal obstruction is noted. Urinary bladder is decompressed. Stomach/Bowel: Large amount of stool is noted in the right and transverse colon. There is no evidence of bowel obstruction. Vascular/Lymphatic: Aortic atherosclerosis. No enlarged abdominal or pelvic lymph nodes. Reproductive: Status post hysterectomy. No adnexal masses. Other: No abdominal wall hernia or abnormality. No abdominopelvic  ascites. Mesenteric masses noted on prior exam are no longer visualized. Musculoskeletal: Status post right hip arthroplasty. Multilevel degenerative disc disease is noted in the lower thoracic and upper lumbar spine. IMPRESSION: Mesenteric masses noted on prior exam are no longer visualized. However, there is significant enlargement of hepatic masses, consistent with worsening metastatic disease. Electronically Signed   By: Marijo Conception, M.D.   On: 03/12/2016 14:16        Scheduled Meds: . cefTRIAXone (ROCEPHIN)  IV  1 g Intravenous Q24H  . colesevelam  1,875 mg Oral BID WC  . dicyclomine  20 mg Oral QID  . doxepin  75 mg Oral QHS  . escitalopram  20 mg Oral Daily  . levothyroxine  75 mcg Oral QAC breakfast  . pantoprazole  40 mg Oral Daily  . psyllium  1 packet Oral TID   Continuous Infusions: . 0.9 % NaCl with KCl 40 mEq / L 125 mL/hr (03/13/16 0808)     LOS: 1 day    Taimur Fier Tanna Furry, MD [facility] Pager [phone]  If 7PM-7AM, please contact night-coverage [email or web address] Password [email or web address] 03/13/2016, 4:18 PM

## 2016-03-14 DIAGNOSIS — A419 Sepsis, unspecified organism: Principal | ICD-10-CM

## 2016-03-14 DIAGNOSIS — N390 Urinary tract infection, site not specified: Secondary | ICD-10-CM

## 2016-03-14 LAB — BASIC METABOLIC PANEL
ANION GAP: 5 (ref 5–15)
BUN: <5 mg/dL — ABNORMAL LOW (ref 6–20)
CALCIUM: 7.6 mg/dL — AB (ref 8.9–10.3)
CO2: 25 mmol/L (ref 22–32)
CREATININE: 0.56 mg/dL (ref 0.44–1.00)
Chloride: 101 mmol/L (ref 101–111)
Glucose, Bld: 122 mg/dL — ABNORMAL HIGH (ref 65–99)
Potassium: 5.1 mmol/L (ref 3.5–5.1)
SODIUM: 131 mmol/L — AB (ref 135–145)

## 2016-03-14 LAB — CBC WITH DIFFERENTIAL/PLATELET
BASOS ABS: 0.1 10*3/uL (ref 0.0–0.1)
BASOS PCT: 1 %
EOS ABS: 0.1 10*3/uL (ref 0.0–0.7)
EOS PCT: 1 %
HEMATOCRIT: 30.5 % — AB (ref 36.0–46.0)
Hemoglobin: 9.7 g/dL — ABNORMAL LOW (ref 12.0–15.0)
Lymphocytes Relative: 7 %
Lymphs Abs: 0.9 10*3/uL (ref 0.7–4.0)
MCH: 28.9 pg (ref 26.0–34.0)
MCHC: 31.8 g/dL (ref 30.0–36.0)
MCV: 90.8 fL (ref 78.0–100.0)
MONO ABS: 0.9 10*3/uL (ref 0.1–1.0)
MONOS PCT: 7 %
NEUTROS ABS: 11.4 10*3/uL — AB (ref 1.7–7.7)
Neutrophils Relative %: 84 %
PLATELETS: 725 10*3/uL — AB (ref 150–400)
RBC: 3.36 MIL/uL — ABNORMAL LOW (ref 3.87–5.11)
RDW: 15.3 % (ref 11.5–15.5)
WBC: 13.3 10*3/uL — ABNORMAL HIGH (ref 4.0–10.5)

## 2016-03-14 MED ORDER — CEFPODOXIME PROXETIL 200 MG PO TABS: 200.0000 mg | ORAL_TABLET | Freq: Two times a day (BID) | ORAL | 0 refills | Status: DC

## 2016-03-14 MED ORDER — AMLODIPINE BESYLATE 10 MG PO TABS: 10.0000 mg | ORAL_TABLET | Freq: Every day | ORAL | Status: AC

## 2016-03-14 NOTE — Care Management Note (Signed)
Case Management Note  Patient Details  Name: Margaret Cowan MRN: [id number] Date of Birth: 12/20/1950  Subjective/Objective:                  Pt admitted with sepsis secondary to UTI. Pt is from home, lives alone. She is ind with ADL's. She uses no DME with mobility. She has CPAP PTA through [facility] She has PCP, does not drive but uses transportation services and has insurance with drug coverage and no difficulty affording medications.   Action/Plan: She plans to return home with self care. Discharging today. No CM needs.   Expected Discharge Date:  03/13/16               Expected Discharge Plan:  Home/Self Care  In-House Referral:  NA  Discharge planning Services  CM Consult  Post Acute Care Choice:  NA Choice offered to:  NA  Status of Service:  Completed, signed off  Sherald Barge, RN 03/14/2016, 2:06 PM

## 2016-03-14 NOTE — Progress Notes (Signed)
OT Cancellation Note  Patient Details Name: Margaret Cowan MRN: [id number] DOB: [birth year]   Cancelled Treatment:     Reason evaluation not completed: Chart reviewed, pt screened for OT needs. Pt sitting up on bench in room eating breakfast on OT arrival. Pt reports she has been feeling like her usual self and has been up and moving around the room, performing ADL tasks with no difficulty. Pt does not use any DME for functional mobility, does have shower seat at home. Pt has family/friend support when necessary for assistance with B/IADL task completion, normally independent. Pt demonstrates BUE strength WFL, performing ADL tasks at baseline. No further OT services required at this time.   Guadelupe Sabin, OTR/L  [phone] 03/14/2016, 8:40 AM

## 2016-03-14 NOTE — Discharge Instructions (Signed)
Follow with Primary MD Glo Herring., MD in 7 days   Get CBC, CMP, 2 view Chest X ray checked  by Primary MD or SNF MD in 5-7 days ( we routinely change or add medications that can affect your baseline labs and fluid status, therefore we recommend that you get the mentioned basic workup next visit with your PCP, your PCP may decide not to get them or add new tests based on their clinical decision)   Activity: As tolerated with Full fall precautions use walker/cane & assistance as needed   Disposition Home     Diet:    Heart Healthy    For Heart failure patients - Check your Weight same time everyday, if you gain over 2 pounds, or you develop in leg swelling, experience more shortness of breath or chest pain, call your Primary MD immediately. Follow Cardiac Low Salt Diet and 1.5 lit/day fluid restriction.   On your next visit with your primary care physician please Get Medicines reviewed and adjusted.   Please request your Prim.MD to go over all Hospital Tests and Procedure/Radiological results at the follow up, please get all Hospital records sent to your Prim MD by signing hospital release before you go home.   If you experience worsening of your admission symptoms, develop shortness of breath, life threatening emergency, suicidal or homicidal thoughts you must seek medical attention immediately by calling 911 or calling your MD immediately  if symptoms less severe.  You Must read complete instructions/literature along with all the possible adverse reactions/side effects for all the Medicines you take and that have been prescribed to you. Take any new Medicines after you have completely understood and accpet all the possible adverse reactions/side effects.   Do not drive, operate heavy machinery, perform activities at heights, swimming or participation in water activities or provide baby sitting services if your were admitted for syncope or siezures until you have seen by Primary MD or  a Neurologist and advised to do so again.  Do not drive when taking Pain medications.    Do not take more than prescribed Pain, Sleep and Anxiety Medications  Special Instructions: If you have smoked or chewed Tobacco  in the last 2 yrs please stop smoking, stop any regular Alcohol  and or any Recreational drug use.  Wear Seat belts while driving.   Please note  You were cared for by a hospitalist during your hospital stay. If you have any questions about your discharge medications or the care you received while you were in the hospital after you are discharged, you can call the unit and asked to speak with the hospitalist on call if the hospitalist that took care of you is not available. Once you are discharged, your primary care physician will handle any further medical issues. Please note that NO REFILLS for any discharge medications will be authorized once you are discharged, as it is imperative that you return to your primary care physician (or establish a relationship with a primary care physician if you do not have one) for your aftercare needs so that they can reassess your need for medications and monitor your lab values.

## 2016-03-14 NOTE — Care Management Important Message (Signed)
Important Message  Patient Details  Name: Margaret Cowan MRN: [id number] Date of Birth: November 30, 1950   Medicare Important Message Given:  Yes    Sherald Barge, RN 03/14/2016, 2:05 PM

## 2016-03-14 NOTE — Progress Notes (Signed)
Pt IV and telemetry removed, tolerated well.  Reviewed discharge instructions with pt and answered questions.  Awaiting pt ride to discharge at this time.

## 2016-03-14 NOTE — Evaluation (Signed)
Physical Therapy Evaluation Patient Details Name: RAJINDER ROSENBERRY MRN: [id number] DOB: [birth year] Today's Date: 03/14/2016   History of Present Illness  65 y.o. female with medical history significant of chronic abdominal wall pain, anemia, anxiety, osteoarthritis, hepatic hemangioma, cancer, chronic lower back pain, chronic constipation, internal hemorrhoids, GERD, headache, hyperlipidemia, hypertension, hypothyroidism, peripheral neuropathy, IBS, sleep apnea on CPAP who is coming to the emergency department due to hematuria since Friday.  Per patient, she underwent an embolization of the liver lesions at [facility] 6 days ago. Things were uneventful during the procedure and she was discharged home. On Thursday, then again Sunday she fell forward without substaining any visible injury. She does not remember all the details, however she states that after the fall she felt like she was paralyzed, unable to get up by herself and taking about an hour and a half to be able to crawl or call for help. On Friday she noticed hematuria, but denies fever, chills, nausea, emesis, frequency, dysuria or urgency. She is states that her appetite and sleep have been decreased recently.Marland Kitchen  Her CT scan of the abdomen and pelvis with contrast showed no mesenteric masses as seen on previous exam, but significant enlargement of hepatic masses consistent with worsening metastatic disease  Clinical Impression  Pt received in bed, and was agreeable to PT evaluation.  She states that prior to admission, she was independent with ambulation, ADL's, and IADL's.  She did have 2 falls in the past 2 weeks, where she states she became weak, and her LE's gave out.  During PT evaluation today, she was independent with supine<>sit, sit<>stand, and ambulation x 171ft.  She does not demonstrate need for skilled PT at this time, therefore, will sign off.        Follow Up Recommendations No PT follow up    Equipment Recommendations  None  recommended by PT    Recommendations for Other Services       Precautions / Restrictions Precautions Precautions: Fall Precaution Comments: fell 2 times in the last 10 days - from fatigue and legs gave out.  Restrictions Weight Bearing Restrictions: No      Mobility  Bed Mobility Overal bed mobility: Independent                Transfers Overall transfer level: Independent                  Ambulation/Gait Ambulation/Gait assistance: Independent Ambulation Distance (Feet): 150 Feet Assistive device: None Gait Pattern/deviations: Step-through pattern;WFL(Within Functional Limits)        Stairs            Wheelchair Mobility    Modified Rankin (Stroke Patients Only)       Balance Overall balance assessment: History of Falls;Independent                                           Pertinent Vitals/Pain Pain Assessment: No/denies pain    Home Living   Living Arrangements: Alone   Type of Home: Apartment Home Access: Level entry     Home Layout: One level Home Equipment: Walker - 2 wheels;Shower seat;Bedside commode;Cane - single point      Prior Function Level of Independence: Independent               Hand Dominance        Extremity/Trunk Assessment   Upper  Extremity Assessment: Overall WFL for tasks assessed           Lower Extremity Assessment: Overall WFL for tasks assessed         Communication   Communication: No difficulties  Cognition Arousal/Alertness: Awake/alert Behavior During Therapy: WFL for tasks assessed/performed Overall Cognitive Status: Within Functional Limits for tasks assessed                      General Comments      Exercises     Assessment/Plan    PT Assessment Patent does not need any further PT services  PT Problem List            PT Treatment Interventions      PT Goals (Current goals can be found in the Care Plan section)  Acute Rehab PT  Goals PT Goal Formulation: All assessment and education complete, DC therapy    Frequency     Barriers to discharge        Co-evaluation               End of Session Equipment Utilized During Treatment: Gait belt Activity Tolerance: Patient tolerated treatment well Patient left: in bed Nurse Communication: Mobility status (Mobility sheet left up in pt's room. )    Functional Assessment Tool Used: [employer]	[place] AM-PAC "6-clicks"  Functional Limitation: Mobility: Walking and moving around Mobility: Walking and Moving Around Current Status [phone]): 0 percent impaired, limited or restricted Mobility: Walking and Moving Around Goal Status [phone]): 0 percent impaired, limited or restricted Mobility: Walking and Moving Around Discharge Status [phone]): 0 percent impaired, limited or restricted    Time: [id number] PT Time Calculation (min) (ACUTE ONLY): 14 min   Charges:   PT Evaluation $PT Eval Low Complexity: 1 Procedure     PT G Codes:   PT G-Codes **NOT FOR INPATIENT CLASS** Functional Assessment Tool Used: [employer] "6-clicks"  Functional Limitation: Mobility: Walking and moving around Mobility: Walking and Moving Around Current Status [phone]): 0 percent impaired, limited or restricted Mobility: Walking and Moving Around Goal Status [phone]): 0 percent impaired, limited or restricted Mobility: Walking and Moving Around Discharge Status [phone]): 0 percent impaired, limited or restricted    Beth Jaben Benegas, PT, DPT X: [id number]

## 2016-03-14 NOTE — Discharge Summary (Signed)
Margaret Cowan [id number] DOB: 1950-05-11 DOA: 03/12/2016  PCP: Glo Herring., MD  Admit date: 03/12/2016  Discharge date: 03/14/2016  Admitted From: Home  Disposition:  Home   Recommendations for Outpatient Follow-up:   Follow up with PCP in 1-2 weeks  PCP Please obtain BMP/CBC, 2 view CXR in 1week,  (see Discharge instructions)   PCP Please follow up on the following pending results: Final Urine culture results   Home Health: None   Equipment/Devices: None  Consultations: None Discharge Condition: Stable   CODE STATUS: Full   Diet Recommendation:  Heart Healthy    Chief Complaint  Patient presents with  . Fatigue     Brief history of present illness from the day of admission and additional interim summary    65 y.o.femalewith medical history significant of anemia, anxiety, osteoarthritis, hepatic hemangioma, cancer, chronic lower back pain, chronic constipation, internal hemorrhoids, GERD, headache, hyperlipidemia, hypertension, hypothyroidism, peripheral neuropathy, IBS, sleep apnea on CPAP who is coming to the emergency department due to hematuria since Friday. Per patient, she underwent an embolization of the liver lesions at [facility] about a week ago. Things were uneventful during the procedure and she was discharged home. In the ER patient was found to have UTI, hypokalemia admitted for further evaluation.  Hospital issues addressed     # Acute cystitis with hematuria (hematuria as per pt) - Patient reported hematuria however UA here was clean without any RBCs, CK levels were unremarkable, urine color was clear. She did have UTI final culture results pending, she has responded well to Rocephin and will be transitioned to 5 more days of oral Vantin. We will follow with PCP within a week  for follow-up.    # HEMANGIOMA, HEPATIC history of carcinoid tumor: Reportedly, patient had embolization procedure for liver cancer about a week at outside hospital at [facility]. CT scan of abdomen showed enlargement of hepatitic masses consistent with worsening metastatic disease. I recommended patient to follow-up with her oncologist and surgeon at [facility] within 7-10 days. Alkaline phosphatase level stable, AST ALT normal.  #  Essential hypertension: Blood pressure borderline was hydrated, discontinued ACE inhibitor and asked to skip Norvasc for 2 more days, PCP to monitor blood pressure and adjust, minimize narcotics and benzodiazepines.  # Hyponatremia, hypokalemia: serum electrolytes improving. Repeat BMP with PCP. Was hydrated here.    # GERD: On Protonix. Metamucil was given for constipation    # Hypothyroidism: Continue Synthroid.  #Fall: CT scan of head and spine unremarkable. Now ambulating without any problems. Requested to minimize narcotics and benzodiazepines.       Discharge diagnosis     Principal Problem:   Sepsis secondary to UTI [facility]) Active Problems:   HEMANGIOMA, HEPATIC   Essential hypertension   GERD   Fibromyalgia   Depression with anxiety   Hypothyroidism   Hyponatremia   Hypokalemia   Abnormal LFTs   Lower urinary tract infectious disease    Discharge instructions    Discharge Instructions    Diet -  low sodium heart healthy    Complete by:  As directed    Discharge instructions    Complete by:  As directed    Follow with Primary MD Glo Herring., MD in 7 days   Get CBC, CMP, 2 view Chest X ray checked  by Primary MD or SNF MD in 5-7 days ( we routinely change or add medications that can affect your baseline labs and fluid status, therefore we recommend that you get the mentioned basic workup next visit with your PCP, your PCP may decide not to get them or add new tests based on their clinical decision)   Activity: As  tolerated with Full fall precautions use walker/cane & assistance as needed   Disposition Home     Diet:    Heart Healthy    For Heart failure patients - Check your Weight same time everyday, if you gain over 2 pounds, or you develop in leg swelling, experience more shortness of breath or chest pain, call your Primary MD immediately. Follow Cardiac Low Salt Diet and 1.5 lit/day fluid restriction.   On your next visit with your primary care physician please Get Medicines reviewed and adjusted.   Please request your Prim.MD to go over all Hospital Tests and Procedure/Radiological results at the follow up, please get all Hospital records sent to your Prim MD by signing hospital release before you go home.   If you experience worsening of your admission symptoms, develop shortness of breath, life threatening emergency, suicidal or homicidal thoughts you must seek medical attention immediately by calling 911 or calling your MD immediately  if symptoms less severe.  You Must read complete instructions/literature along with all the possible adverse reactions/side effects for all the Medicines you take and that have been prescribed to you. Take any new Medicines after you have completely understood and accpet all the possible adverse reactions/side effects.   Do not drive, operate heavy machinery, perform activities at heights, swimming or participation in water activities or provide baby sitting services if your were admitted for syncope or siezures until you have seen by Primary MD or a Neurologist and advised to do so again.  Do not drive when taking Pain medications.    Do not take more than prescribed Pain, Sleep and Anxiety Medications  Special Instructions: If you have smoked or chewed Tobacco  in the last 2 yrs please stop smoking, stop any regular Alcohol  and or any Recreational drug use.  Wear Seat belts while driving.   Please note  You were cared for by a hospitalist during  your hospital stay. If you have any questions about your discharge medications or the care you received while you were in the hospital after you are discharged, you can call the unit and asked to speak with the hospitalist on call if the hospitalist that took care of you is not available. Once you are discharged, your primary care physician will handle any further medical issues. Please note that NO REFILLS for any discharge medications will be authorized once you are discharged, as it is imperative that you return to your primary care physician (or establish a relationship with a primary care physician if you do not have one) for your aftercare needs so that they can reassess your need for medications and monitor your lab values.   Increase activity slowly    Complete by:  As directed       Discharge Medications     Medication List  STOP taking these medications   lisinopril 40 MG tablet Commonly known as:  PRINIVIL,ZESTRIL     TAKE these medications   alprazolam 2 MG tablet Commonly known as:  XANAX Take 2 mg by mouth at bedtime as needed for sleep or anxiety.   amLODipine 10 MG tablet Commonly known as:  NORVASC Take 1 tablet (10 mg total) by mouth daily. Start taking on:  03/16/2016   cefpodoxime 200 MG tablet Commonly known as:  VANTIN Take 1 tablet (200 mg total) by mouth 2 (two) times daily.   colesevelam 625 MG tablet Commonly known as:  WELCHOL Take 1,875 mg by mouth 2 (two) times daily with a meal.   CYANOCOBALAMIN IJ Inject 1 application as directed every 30 (thirty) days.   dicyclomine 20 MG tablet Commonly known as:  BENTYL Take 20 mg by mouth 4 (four) times daily.   doxepin 25 MG capsule Commonly known as:  SINEQUAN Take 75 mg by mouth at bedtime.   escitalopram 20 MG tablet Commonly known as:  LEXAPRO Take 20 mg by mouth daily.   esomeprazole 40 MG capsule Commonly known as:  NEXIUM Take 40 mg by mouth daily.   GAVISCON EXTRA RELIEF FORMULA  508-475 MG/10ML Susp Generic drug:  Alum Hydroxide-Mag Carbonate Take 30 mLs by mouth daily as needed (heartburn).   HYDROmorphone 4 MG tablet Commonly known as:  DILAUDID Take 4 mg by mouth 4 (four) times daily. Pain   levothyroxine 75 MCG tablet Commonly known as:  SYNTHROID, LEVOTHROID Take 75 mcg by mouth daily before breakfast.   REFRESH TEARS 0.5 % Soln Generic drug:  carboxymethylcellulose Place 1 drop into both eyes 3 (three) times daily as needed (for dry eyes).   tiZANidine 4 MG tablet Commonly known as:  ZANAFLEX Take 4 mg by mouth 4 (four) times daily.       Follow-up Information    Glo Herring., MD. Schedule an appointment as soon as possible for a visit in 1 week(s).   Specialty:  Internal Medicine Contact information: [address] [place] [id number] [phone]           Major procedures and Radiology Reports - PLEASE review detailed and final reports thoroughly  -         Dg Chest 2 View  Result Date: 03/12/2016 CLINICAL DATA:  Carcinoid, generalized weakness and fatigue for 4 days. EXAM: CHEST  2 VIEW COMPARISON:  12/10/2012 FINDINGS: Heart and mediastinal contours are within normal limits. No focal opacities or effusions. No acute bony abnormality. IMPRESSION: No active cardiopulmonary disease. Electronically Signed   By: Rolm Baptise M.D.   On: 03/12/2016 13:26   Ct Head Wo Contrast  Result Date: 03/12/2016 CLINICAL DATA:  Multiple falls x1 week, confusion EXAM: CT HEAD WITHOUT CONTRAST CT CERVICAL SPINE WITHOUT CONTRAST TECHNIQUE: Multidetector CT imaging of the head and cervical spine was performed following the standard protocol without intravenous contrast. Multiplanar CT image reconstructions of the cervical spine were also generated. COMPARISON:  CT cervical spine dated 12/19/2012 FINDINGS: CT HEAD FINDINGS Brain: No evidence of acute infarction, hemorrhage, hydrocephalus, extra-axial collection or mass lesion/mass effect.  Vascular: Mild intracranial atherosclerosis. Skull: Normal. Negative for fracture or focal lesion. Sinuses/Orbits: The visualized paranasal sinuses are essentially clear. The mastoid air cells are unopacified. Bilateral globes and retroconal soft tissues are within normal limits. Other: Cerebral volume is within normal limits. No ventriculomegaly. CT CERVICAL SPINE FINDINGS Alignment: Normal cervical lordosis. Skull base and vertebrae: No acute fracture. Status post C3-5  ACDF, without evidence of complication. No primary bone lesion or focal pathologic process. Soft tissues and spinal canal: No prevertebral fluid or swelling. No visible canal hematoma. Disc levels:  Spinal canal is patent. Mild multilevel degenerative changes. Upper chest: Visualized lung apices are clear. Other: Visualized thyroid is unremarkable. IMPRESSION: Normal head CT. No evidence of traumatic injury to the cervical spine. Status post C3-5 ACDF, without evidence of complication. Electronically Signed   By: Julian Hy M.D.   On: 03/12/2016 14:09   Ct Cervical Spine Wo Contrast  Result Date: 03/12/2016 CLINICAL DATA:  Multiple falls x1 week, confusion EXAM: CT HEAD WITHOUT CONTRAST CT CERVICAL SPINE WITHOUT CONTRAST TECHNIQUE: Multidetector CT imaging of the head and cervical spine was performed following the standard protocol without intravenous contrast. Multiplanar CT image reconstructions of the cervical spine were also generated. COMPARISON:  CT cervical spine dated 12/19/2012 FINDINGS: CT HEAD FINDINGS Brain: No evidence of acute infarction, hemorrhage, hydrocephalus, extra-axial collection or mass lesion/mass effect. Vascular: Mild intracranial atherosclerosis. Skull: Normal. Negative for fracture or focal lesion. Sinuses/Orbits: The visualized paranasal sinuses are essentially clear. The mastoid air cells are unopacified. Bilateral globes and retroconal soft tissues are within normal limits. Other: Cerebral volume is within  normal limits. No ventriculomegaly. CT CERVICAL SPINE FINDINGS Alignment: Normal cervical lordosis. Skull base and vertebrae: No acute fracture. Status post C3-5 ACDF, without evidence of complication. No primary bone lesion or focal pathologic process. Soft tissues and spinal canal: No prevertebral fluid or swelling. No visible canal hematoma. Disc levels:  Spinal canal is patent. Mild multilevel degenerative changes. Upper chest: Visualized lung apices are clear. Other: Visualized thyroid is unremarkable. IMPRESSION: Normal head CT. No evidence of traumatic injury to the cervical spine. Status post C3-5 ACDF, without evidence of complication. Electronically Signed   By: Julian Hy M.D.   On: 03/12/2016 14:09   Ct Abdomen Pelvis W Contrast  Result Date: 03/12/2016 CLINICAL DATA:  Multiple falls, gross hematuria. EXAM: CT ABDOMEN AND PELVIS WITH CONTRAST TECHNIQUE: Multidetector CT imaging of the abdomen and pelvis was performed using the standard protocol following bolus administration of intravenous contrast. CONTRAST:  16mL ISOVUE-300 IOPAMIDOL (ISOVUE-300) INJECTION 61% COMPARISON:  CT scan of September 24, 2014. FINDINGS: Lower chest: No acute abnormality. Hepatobiliary: Status post cholecystectomy. Large cyst seen inferior to right hepatic lobe is no longer present. Irregular it high density is seen in the anterior segment of right hepatic low which may be due to embolization procedure. Multiple hepatic masses are noted which appear to be increased in size compared to prior exam, consistent with worsening metastases. The largest measures 5.3 x 4.7 cm arising superiorly from the left hepatic lobe, which is significantly enlarged compared to prior exam 3.5 x 2.8 cm lesion is noted in caudate lobe which is enlarged compared to prior exam 4.0 x 2.7 cm lesion is noted in dome of right hepatic lobe which is enlarged compared to prior exam. Pancreas: Unremarkable. No pancreatic ductal dilatation or  surrounding inflammatory changes. Spleen: Normal in size without focal abnormality. Adrenals/Urinary Tract: Adrenal glands appear normal. Stable bilateral renal cysts are noted. No hydronephrosis or renal obstruction is noted. Urinary bladder is decompressed. Stomach/Bowel: Large amount of stool is noted in the right and transverse colon. There is no evidence of bowel obstruction. Vascular/Lymphatic: Aortic atherosclerosis. No enlarged abdominal or pelvic lymph nodes. Reproductive: Status post hysterectomy. No adnexal masses. Other: No abdominal wall hernia or abnormality. No abdominopelvic ascites. Mesenteric masses noted on prior exam are no longer visualized.  Musculoskeletal: Status post right hip arthroplasty. Multilevel degenerative disc disease is noted in the lower thoracic and upper lumbar spine. IMPRESSION: Mesenteric masses noted on prior exam are no longer visualized. However, there is significant enlargement of hepatic masses, consistent with worsening metastatic disease. Electronically Signed   By: Marijo Conception, M.D.   On: 03/12/2016 14:16    Micro Results     Recent Results (from the past 240 hour(s))  Urine culture     Status: Abnormal (Preliminary result)   Collection Time: 03/12/16 12:48 PM  Result Value Ref Range Status   Specimen Description URINE, CLEAN CATCH  Final   Special Requests NONE  Final   Culture >=100,000 COLONIES/mL GRAM NEGATIVE RODS (A)  Final   Report Status PENDING  Incomplete  Culture, blood (x 2)     Status: None (Preliminary result)   Collection Time: 03/13/16 12:30 AM  Result Value Ref Range Status   Specimen Description LEFT ANTECUBITAL  Final   Special Requests BOTTLES DRAWN AEROBIC AND ANAEROBIC 6CC EACH  Final   Culture NO GROWTH 1 DAY  Final   Report Status PENDING  Incomplete  Culture, blood (x 2)     Status: None (Preliminary result)   Collection Time: 03/13/16 12:44 AM  Result Value Ref Range Status   Specimen Description BLOOD LEFT FOREARM   Final   Special Requests BOTTLES DRAWN AEROBIC AND ANAEROBIC [place]  Final   Culture NO GROWTH 1 DAY  Final   Report Status PENDING  Incomplete    Today   Subjective    Hessa Granieri today has no headache,no chest abdominal pain,no new weakness tingling or numbness, feels much better wants to go home today.     Objective   Blood pressure (!) 107/45, pulse 63, temperature 98.5 F (36.9 C), temperature source Oral, resp. rate 20, height 5\' 4"  (1.626 m), weight 89.3 kg (196 lb 13.9 oz), SpO2 95 %.   Intake/Output Summary (Last 24 hours) at 03/14/16 1053 Last data filed at 03/14/16 0900  Gross per 24 hour  Intake          1940.83 ml  Output             1900 ml  Net            40.83 ml    Exam Awake Alert, Oriented x 3, No new F.N deficits, Normal affect [place].AT,PERRAL Supple Neck,No JVD, No cervical lymphadenopathy appriciated.  Symmetrical Chest wall movement, Good air movement bilaterally, CTAB RRR,No Gallops,Rubs or new Murmurs, No Parasternal Heave +ve B.Sounds, Abd Soft, Non tender, No organomegaly appriciated, No rebound -guarding or rigidity. No Cyanosis, Clubbing or edema, No new Rash or bruise   Data Review   CBC w Diff:  Lab Results  Component Value Date   WBC 13.3 (H) 03/14/2016   HGB 9.7 (L) 03/14/2016   HCT 30.5 (L) 03/14/2016   PLT 725 (H) 03/14/2016   LYMPHOPCT 7 03/14/2016   MONOPCT 7 03/14/2016   EOSPCT 1 03/14/2016   BASOPCT 1 03/14/2016    CMP:  Lab Results  Component Value Date   NA 131 (L) 03/14/2016   K 5.1 03/14/2016   CL 101 03/14/2016   CO2 25 03/14/2016   BUN <5 (L) 03/14/2016   CREATININE 0.56 03/14/2016   PROT 5.3 (L) 03/13/2016   ALBUMIN 2.2 (L) 03/13/2016   BILITOT 1.3 (H) 03/13/2016   ALKPHOS 172 (H) 03/13/2016   AST 32 03/13/2016   ALT 51 03/13/2016  .  Total Time in preparing paper work, data evaluation and todays exam - 35 minutes  Thurnell Lose M.D on 03/14/2016 at 10:53 AM  [facility]   Office   [phone]

## 2016-03-15 LAB — URINE CULTURE: Culture: >=100000 — AB

## 2016-03-16 DIAGNOSIS — E871 Hypo-osmolality and hyponatremia: Secondary | ICD-10-CM | POA: Diagnosis not present

## 2016-03-16 DIAGNOSIS — Z1389 Encounter for screening for other disorder: Secondary | ICD-10-CM | POA: Diagnosis not present

## 2016-03-16 DIAGNOSIS — D649 Anemia, unspecified: Secondary | ICD-10-CM | POA: Diagnosis not present

## 2016-03-16 DIAGNOSIS — Z6833 Body mass index (BMI) 33.0-33.9, adult: Secondary | ICD-10-CM | POA: Diagnosis not present

## 2016-03-16 DIAGNOSIS — N390 Urinary tract infection, site not specified: Secondary | ICD-10-CM | POA: Diagnosis not present

## 2016-03-16 DIAGNOSIS — E6609 Other obesity due to excess calories: Secondary | ICD-10-CM | POA: Diagnosis not present

## 2016-03-16 DIAGNOSIS — I959 Hypotension, unspecified: Secondary | ICD-10-CM | POA: Diagnosis not present

## 2016-03-16 DIAGNOSIS — E538 Deficiency of other specified B group vitamins: Secondary | ICD-10-CM | POA: Diagnosis not present

## 2016-03-16 DIAGNOSIS — D473 Essential (hemorrhagic) thrombocythemia: Secondary | ICD-10-CM | POA: Diagnosis not present

## 2016-03-16 DIAGNOSIS — E876 Hypokalemia: Secondary | ICD-10-CM | POA: Diagnosis not present

## 2016-03-16 DIAGNOSIS — C7B00 Secondary carcinoid tumors, unspecified site: Secondary | ICD-10-CM | POA: Diagnosis not present

## 2016-03-18 LAB — CULTURE, BLOOD (ROUTINE X 2)
CULTURE: NO GROWTH
Culture: NO GROWTH

## 2016-03-21 DIAGNOSIS — D3A8 Other benign neuroendocrine tumors: Secondary | ICD-10-CM | POA: Diagnosis not present

## 2016-03-21 DIAGNOSIS — Z79899 Other long term (current) drug therapy: Secondary | ICD-10-CM | POA: Diagnosis not present

## 2016-03-21 DIAGNOSIS — C7B09 Secondary carcinoid tumors of other sites: Secondary | ICD-10-CM | POA: Diagnosis not present

## 2016-03-21 DIAGNOSIS — Z5111 Encounter for antineoplastic chemotherapy: Secondary | ICD-10-CM | POA: Diagnosis not present

## 2016-03-21 DIAGNOSIS — D3A00 Benign carcinoid tumor of unspecified site: Secondary | ICD-10-CM | POA: Diagnosis not present

## 2016-04-03 ENCOUNTER — Ambulatory Visit (HOSPITAL_COMMUNITY): Payer: Medicare Other | Admitting: Oncology

## 2016-04-03 DIAGNOSIS — M150 Primary generalized (osteo)arthritis: Secondary | ICD-10-CM | POA: Diagnosis not present

## 2016-04-03 DIAGNOSIS — M25561 Pain in right knee: Secondary | ICD-10-CM | POA: Diagnosis not present

## 2016-04-03 DIAGNOSIS — M545 Low back pain: Secondary | ICD-10-CM | POA: Diagnosis not present

## 2016-04-03 DIAGNOSIS — R1084 Generalized abdominal pain: Secondary | ICD-10-CM | POA: Diagnosis not present

## 2016-04-10 DIAGNOSIS — Z1231 Encounter for screening mammogram for malignant neoplasm of breast: Secondary | ICD-10-CM | POA: Diagnosis not present

## 2016-04-20 DIAGNOSIS — R42 Dizziness and giddiness: Secondary | ICD-10-CM | POA: Diagnosis not present

## 2016-04-20 DIAGNOSIS — J00 Acute nasopharyngitis [common cold]: Secondary | ICD-10-CM | POA: Diagnosis not present

## 2016-04-25 DIAGNOSIS — Z79899 Other long term (current) drug therapy: Secondary | ICD-10-CM | POA: Diagnosis not present

## 2016-04-25 DIAGNOSIS — D3A00 Benign carcinoid tumor of unspecified site: Secondary | ICD-10-CM | POA: Diagnosis not present

## 2016-04-25 DIAGNOSIS — Z5111 Encounter for antineoplastic chemotherapy: Secondary | ICD-10-CM | POA: Diagnosis not present

## 2016-04-26 DIAGNOSIS — D473 Essential (hemorrhagic) thrombocythemia: Secondary | ICD-10-CM | POA: Diagnosis not present

## 2016-05-14 DIAGNOSIS — D473 Essential (hemorrhagic) thrombocythemia: Secondary | ICD-10-CM | POA: Diagnosis not present

## 2016-05-14 DIAGNOSIS — D3A8 Other benign neuroendocrine tumors: Secondary | ICD-10-CM | POA: Diagnosis not present

## 2016-05-17 DIAGNOSIS — D473 Essential (hemorrhagic) thrombocythemia: Secondary | ICD-10-CM | POA: Diagnosis not present

## 2016-05-17 DIAGNOSIS — D3A00 Benign carcinoid tumor of unspecified site: Secondary | ICD-10-CM | POA: Diagnosis not present

## 2016-05-23 DIAGNOSIS — Z5111 Encounter for antineoplastic chemotherapy: Secondary | ICD-10-CM | POA: Diagnosis not present

## 2016-05-23 DIAGNOSIS — D3A00 Benign carcinoid tumor of unspecified site: Secondary | ICD-10-CM | POA: Diagnosis not present

## 2016-05-23 DIAGNOSIS — C7B8 Other secondary neuroendocrine tumors: Secondary | ICD-10-CM | POA: Diagnosis not present

## 2016-05-23 DIAGNOSIS — Z79899 Other long term (current) drug therapy: Secondary | ICD-10-CM | POA: Diagnosis not present

## 2016-05-23 DIAGNOSIS — D3A8 Other benign neuroendocrine tumors: Secondary | ICD-10-CM | POA: Diagnosis not present

## 2016-05-31 ENCOUNTER — Telehealth (INDEPENDENT_AMBULATORY_CARE_PROVIDER_SITE_OTHER): Payer: Self-pay | Admitting: Orthopaedic Surgery

## 2016-05-31 NOTE — Telephone Encounter (Signed)
Patient thought she had an appt on March 6th at 3:30 but has discovered that it is on 06-04-16 @ 3:30.  She is requesting that you fax transportation at  [phone] and let them know that her doctor is in surgery on the Mar 6th and cannot see her, but is able to see her it Mar 5th.  She must let transportation know of any changes a week in advance.

## 2016-05-31 NOTE — Telephone Encounter (Signed)
Per Jackelyn Poling she wrote this note and faxed for the patient for transportation

## 2016-06-04 ENCOUNTER — Ambulatory Visit (INDEPENDENT_AMBULATORY_CARE_PROVIDER_SITE_OTHER): Payer: Medicare Other

## 2016-06-04 ENCOUNTER — Ambulatory Visit (INDEPENDENT_AMBULATORY_CARE_PROVIDER_SITE_OTHER): Payer: Medicare Other | Admitting: Orthopaedic Surgery

## 2016-06-04 DIAGNOSIS — M545 Low back pain: Secondary | ICD-10-CM

## 2016-06-04 DIAGNOSIS — G8929 Other chronic pain: Secondary | ICD-10-CM | POA: Diagnosis not present

## 2016-06-04 DIAGNOSIS — M25512 Pain in left shoulder: Secondary | ICD-10-CM | POA: Diagnosis not present

## 2016-06-04 DIAGNOSIS — M549 Dorsalgia, unspecified: Secondary | ICD-10-CM
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
## 2016-06-05 ENCOUNTER — Other Ambulatory Visit (INDEPENDENT_AMBULATORY_CARE_PROVIDER_SITE_OTHER): Payer: Self-pay

## 2016-06-05 DIAGNOSIS — M25512 Pain in left shoulder: Principal | ICD-10-CM

## 2016-06-05 DIAGNOSIS — I5032 Chronic diastolic (congestive) heart failure: Secondary | ICD-10-CM | POA: Diagnosis not present

## 2016-06-05 DIAGNOSIS — G4700 Insomnia, unspecified: Secondary | ICD-10-CM | POA: Diagnosis not present

## 2016-06-05 DIAGNOSIS — Z1389 Encounter for screening for other disorder: Secondary | ICD-10-CM | POA: Diagnosis not present

## 2016-06-05 DIAGNOSIS — M25511 Pain in right shoulder: Principal | ICD-10-CM

## 2016-06-05 DIAGNOSIS — Z6835 Body mass index (BMI) 35.0-35.9, adult: Secondary | ICD-10-CM | POA: Diagnosis not present

## 2016-06-05 DIAGNOSIS — G8929 Other chronic pain: Secondary | ICD-10-CM

## 2016-06-19 ENCOUNTER — Ambulatory Visit (INDEPENDENT_AMBULATORY_CARE_PROVIDER_SITE_OTHER): Payer: Medicare Other | Admitting: Physical Medicine and Rehabilitation

## 2016-06-19 ENCOUNTER — Encounter (INDEPENDENT_AMBULATORY_CARE_PROVIDER_SITE_OTHER): Payer: Self-pay | Admitting: Physical Medicine and Rehabilitation

## 2016-06-19 ENCOUNTER — Ambulatory Visit (INDEPENDENT_AMBULATORY_CARE_PROVIDER_SITE_OTHER): Payer: Medicare Other

## 2016-06-19 VITALS — BP 133/70 | HR 48

## 2016-06-19 DIAGNOSIS — M25511 Pain in right shoulder: Secondary | ICD-10-CM | POA: Diagnosis not present

## 2016-06-19 DIAGNOSIS — M25512 Pain in left shoulder: Secondary | ICD-10-CM

## 2016-06-19 DIAGNOSIS — G8929 Other chronic pain: Secondary | ICD-10-CM

## 2016-06-19 NOTE — Patient Instructions (Signed)
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
## 2016-06-21 MED ORDER — BUPIVACAINE HCL 0.5 % IJ SOLN
3.0000 mL | INTRAMUSCULAR | Status: AC | PRN
  Administered 2016-06-19: 3 mL via INTRA_ARTICULAR

## 2016-06-21 MED ORDER — TRIAMCINOLONE ACETONIDE 40 MG/ML IJ SUSP
40.0000 mg | INTRAMUSCULAR | Status: AC | PRN
  Administered 2016-06-19: 40 mg via INTRA_ARTICULAR

## 2016-06-26 DIAGNOSIS — G8929 Other chronic pain: Secondary | ICD-10-CM | POA: Diagnosis not present

## 2016-06-26 DIAGNOSIS — G893 Neoplasm related pain (acute) (chronic): Secondary | ICD-10-CM | POA: Diagnosis not present

## 2016-06-26 DIAGNOSIS — R1084 Generalized abdominal pain: Secondary | ICD-10-CM | POA: Diagnosis not present

## 2016-06-26 DIAGNOSIS — M25511 Pain in right shoulder: Secondary | ICD-10-CM | POA: Diagnosis not present

## 2016-06-26 DIAGNOSIS — M545 Low back pain: Secondary | ICD-10-CM | POA: Diagnosis not present

## 2016-07-04 DIAGNOSIS — Z79899 Other long term (current) drug therapy: Secondary | ICD-10-CM | POA: Diagnosis not present

## 2016-07-04 DIAGNOSIS — D3A00 Benign carcinoid tumor of unspecified site: Secondary | ICD-10-CM | POA: Diagnosis not present

## 2016-07-04 DIAGNOSIS — Z5111 Encounter for antineoplastic chemotherapy: Secondary | ICD-10-CM | POA: Diagnosis not present

## 2016-07-06 DIAGNOSIS — H16223 Keratoconjunctivitis sicca, not specified as Sjogren's, bilateral: Secondary | ICD-10-CM | POA: Diagnosis not present

## 2016-07-06 DIAGNOSIS — H524 Presbyopia: Secondary | ICD-10-CM | POA: Diagnosis not present

## 2016-07-06 DIAGNOSIS — Z961 Presence of intraocular lens: Secondary | ICD-10-CM | POA: Diagnosis not present

## 2016-07-06 DIAGNOSIS — H04123 Dry eye syndrome of bilateral lacrimal glands: Secondary | ICD-10-CM | POA: Diagnosis not present

## 2016-07-18 DIAGNOSIS — D1800 Hemangioma unspecified site: Secondary | ICD-10-CM | POA: Diagnosis not present

## 2016-07-18 DIAGNOSIS — L821 Other seborrheic keratosis: Secondary | ICD-10-CM | POA: Diagnosis not present

## 2016-07-26 DIAGNOSIS — C9210 Chronic myeloid leukemia, BCR/ABL-positive, not having achieved remission: Secondary | ICD-10-CM | POA: Diagnosis not present

## 2016-07-26 DIAGNOSIS — D473 Essential (hemorrhagic) thrombocythemia: Secondary | ICD-10-CM | POA: Diagnosis not present

## 2016-08-06 DIAGNOSIS — K589 Irritable bowel syndrome without diarrhea: Secondary | ICD-10-CM | POA: Diagnosis not present

## 2016-08-06 DIAGNOSIS — D3A8 Other benign neuroendocrine tumors: Secondary | ICD-10-CM | POA: Diagnosis not present

## 2016-08-06 DIAGNOSIS — C9210 Chronic myeloid leukemia, BCR/ABL-positive, not having achieved remission: Secondary | ICD-10-CM | POA: Diagnosis not present

## 2016-08-06 DIAGNOSIS — D473 Essential (hemorrhagic) thrombocythemia: Secondary | ICD-10-CM | POA: Diagnosis not present

## 2016-08-06 DIAGNOSIS — I10 Essential (primary) hypertension: Secondary | ICD-10-CM | POA: Diagnosis not present

## 2016-08-06 DIAGNOSIS — K219 Gastro-esophageal reflux disease without esophagitis: Secondary | ICD-10-CM | POA: Diagnosis not present

## 2016-08-06 DIAGNOSIS — E785 Hyperlipidemia, unspecified: Secondary | ICD-10-CM | POA: Diagnosis not present

## 2016-08-06 DIAGNOSIS — G4733 Obstructive sleep apnea (adult) (pediatric): Secondary | ICD-10-CM | POA: Diagnosis not present

## 2016-08-13 DIAGNOSIS — D3A00 Benign carcinoid tumor of unspecified site: Secondary | ICD-10-CM | POA: Diagnosis not present

## 2016-08-13 DIAGNOSIS — E063 Autoimmune thyroiditis: Secondary | ICD-10-CM | POA: Diagnosis not present

## 2016-08-13 DIAGNOSIS — I10 Essential (primary) hypertension: Secondary | ICD-10-CM | POA: Diagnosis not present

## 2016-08-13 DIAGNOSIS — D473 Essential (hemorrhagic) thrombocythemia: Secondary | ICD-10-CM | POA: Diagnosis not present

## 2016-08-13 DIAGNOSIS — E6609 Other obesity due to excess calories: Secondary | ICD-10-CM | POA: Diagnosis not present

## 2016-08-13 DIAGNOSIS — F419 Anxiety disorder, unspecified: Secondary | ICD-10-CM | POA: Diagnosis not present

## 2016-08-13 DIAGNOSIS — Z6833 Body mass index (BMI) 33.0-33.9, adult: Secondary | ICD-10-CM | POA: Diagnosis not present

## 2016-08-13 DIAGNOSIS — C9210 Chronic myeloid leukemia, BCR/ABL-positive, not having achieved remission: Secondary | ICD-10-CM | POA: Diagnosis not present

## 2016-08-15 DIAGNOSIS — C9210 Chronic myeloid leukemia, BCR/ABL-positive, not having achieved remission: Secondary | ICD-10-CM | POA: Diagnosis not present

## 2016-08-15 DIAGNOSIS — D3A00 Benign carcinoid tumor of unspecified site: Secondary | ICD-10-CM | POA: Diagnosis not present

## 2016-08-15 DIAGNOSIS — D3A8 Other benign neuroendocrine tumors: Secondary | ICD-10-CM | POA: Diagnosis not present

## 2016-08-15 DIAGNOSIS — D473 Essential (hemorrhagic) thrombocythemia: Secondary | ICD-10-CM | POA: Diagnosis not present

## 2016-08-15 DIAGNOSIS — K7689 Other specified diseases of liver: Secondary | ICD-10-CM | POA: Diagnosis not present

## 2016-08-15 DIAGNOSIS — Z5111 Encounter for antineoplastic chemotherapy: Secondary | ICD-10-CM | POA: Diagnosis not present

## 2016-08-28 DIAGNOSIS — C9210 Chronic myeloid leukemia, BCR/ABL-positive, not having achieved remission: Secondary | ICD-10-CM | POA: Diagnosis not present

## 2016-09-02 DIAGNOSIS — F329 Major depressive disorder, single episode, unspecified: Secondary | ICD-10-CM | POA: Diagnosis not present

## 2016-09-02 DIAGNOSIS — I10 Essential (primary) hypertension: Secondary | ICD-10-CM | POA: Diagnosis not present

## 2016-09-02 DIAGNOSIS — R300 Dysuria: Secondary | ICD-10-CM | POA: Diagnosis not present

## 2016-09-02 DIAGNOSIS — Z79899 Other long term (current) drug therapy: Secondary | ICD-10-CM | POA: Diagnosis not present

## 2016-09-02 DIAGNOSIS — F419 Anxiety disorder, unspecified: Secondary | ICD-10-CM | POA: Diagnosis not present

## 2016-09-02 DIAGNOSIS — N3001 Acute cystitis with hematuria: Secondary | ICD-10-CM | POA: Diagnosis not present

## 2016-09-02 DIAGNOSIS — Z87440 Personal history of urinary (tract) infections: Secondary | ICD-10-CM | POA: Diagnosis not present

## 2016-09-02 DIAGNOSIS — M797 Fibromyalgia: Secondary | ICD-10-CM | POA: Diagnosis not present

## 2016-09-04 DIAGNOSIS — C9210 Chronic myeloid leukemia, BCR/ABL-positive, not having achieved remission: Secondary | ICD-10-CM | POA: Diagnosis not present

## 2016-09-10 DIAGNOSIS — C9210 Chronic myeloid leukemia, BCR/ABL-positive, not having achieved remission: Secondary | ICD-10-CM | POA: Diagnosis not present

## 2016-09-12 DIAGNOSIS — C7B8 Other secondary neuroendocrine tumors: Secondary | ICD-10-CM | POA: Diagnosis not present

## 2016-09-12 DIAGNOSIS — Z5111 Encounter for antineoplastic chemotherapy: Secondary | ICD-10-CM | POA: Diagnosis not present

## 2016-09-12 DIAGNOSIS — C9210 Chronic myeloid leukemia, BCR/ABL-positive, not having achieved remission: Secondary | ICD-10-CM | POA: Diagnosis not present

## 2016-09-12 DIAGNOSIS — D3A00 Benign carcinoid tumor of unspecified site: Secondary | ICD-10-CM | POA: Diagnosis not present

## 2016-09-13 DIAGNOSIS — Z6832 Body mass index (BMI) 32.0-32.9, adult: Secondary | ICD-10-CM | POA: Diagnosis not present

## 2016-09-13 DIAGNOSIS — E6609 Other obesity due to excess calories: Secondary | ICD-10-CM | POA: Diagnosis not present

## 2016-09-13 DIAGNOSIS — C92Z0 Other myeloid leukemia not having achieved remission: Secondary | ICD-10-CM | POA: Diagnosis not present

## 2016-09-13 DIAGNOSIS — R001 Bradycardia, unspecified: Secondary | ICD-10-CM | POA: Diagnosis not present

## 2016-09-13 DIAGNOSIS — F419 Anxiety disorder, unspecified: Secondary | ICD-10-CM | POA: Diagnosis not present

## 2016-09-13 DIAGNOSIS — D3A00 Benign carcinoid tumor of unspecified site: Secondary | ICD-10-CM | POA: Diagnosis not present

## 2016-09-13 DIAGNOSIS — E063 Autoimmune thyroiditis: Secondary | ICD-10-CM | POA: Diagnosis not present

## 2016-09-13 DIAGNOSIS — Z79891 Long term (current) use of opiate analgesic: Secondary | ICD-10-CM | POA: Diagnosis not present

## 2016-09-20 DIAGNOSIS — D3A00 Benign carcinoid tumor of unspecified site: Secondary | ICD-10-CM | POA: Diagnosis not present

## 2016-09-27 DIAGNOSIS — R1084 Generalized abdominal pain: Secondary | ICD-10-CM | POA: Diagnosis not present

## 2016-09-27 DIAGNOSIS — G8929 Other chronic pain: Secondary | ICD-10-CM | POA: Diagnosis not present

## 2016-09-27 DIAGNOSIS — Z79899 Other long term (current) drug therapy: Secondary | ICD-10-CM | POA: Diagnosis not present

## 2016-09-27 DIAGNOSIS — G893 Neoplasm related pain (acute) (chronic): Secondary | ICD-10-CM | POA: Diagnosis not present

## 2016-09-27 DIAGNOSIS — M545 Low back pain: Secondary | ICD-10-CM | POA: Diagnosis not present

## 2016-09-27 DIAGNOSIS — M25561 Pain in right knee: Secondary | ICD-10-CM | POA: Diagnosis not present

## 2016-09-27 DIAGNOSIS — Z5181 Encounter for therapeutic drug level monitoring: Secondary | ICD-10-CM | POA: Diagnosis not present

## 2016-09-27 DIAGNOSIS — C9210 Chronic myeloid leukemia, BCR/ABL-positive, not having achieved remission: Secondary | ICD-10-CM | POA: Diagnosis not present

## 2016-10-02 ENCOUNTER — Ambulatory Visit (INDEPENDENT_AMBULATORY_CARE_PROVIDER_SITE_OTHER): Payer: Medicare Other | Admitting: Cardiology

## 2016-10-02 ENCOUNTER — Encounter: Payer: Self-pay | Admitting: *Deleted

## 2016-10-02 ENCOUNTER — Encounter: Payer: Self-pay | Admitting: Cardiology

## 2016-10-02 VITALS — BP 120/64 | HR 44 | Ht 64.0 in | Wt 185.0 lb

## 2016-10-02 DIAGNOSIS — R001 Bradycardia, unspecified: Secondary | ICD-10-CM | POA: Diagnosis not present

## 2016-10-02 DIAGNOSIS — R600 Localized edema: Secondary | ICD-10-CM

## 2016-10-02 DIAGNOSIS — R011 Cardiac murmur, unspecified: Secondary | ICD-10-CM | POA: Diagnosis not present

## 2016-10-02 DIAGNOSIS — R002 Palpitations: Secondary | ICD-10-CM

## 2016-10-02 DIAGNOSIS — I10 Essential (primary) hypertension: Secondary | ICD-10-CM

## 2016-10-02 NOTE — Patient Instructions (Signed)
Your physician wants you to follow-up in: 6 months with Dr Harl Bowie in [facility] will receive a reminder letter in the mail two months in advance. If you don't receive a letter, please call our office to schedule the follow-up appointment.    Your physician has requested that you have an echocardiogram. Echocardiography is a painless test that uses sound waves to create images of your heart. It provides your doctor with information about the size and shape of your heart and how well your heart's chambers and valves are working. This procedure takes approximately one hour. There are no restrictions for this procedure.      Your physician recommends that you continue on your current medications as directed. Please refer to the Current Medication list given to you today.     Thank you for choosing [facility] !
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
## 2016-10-10 DIAGNOSIS — D3A00 Benign carcinoid tumor of unspecified site: Secondary | ICD-10-CM | POA: Diagnosis not present

## 2016-10-10 DIAGNOSIS — I499 Cardiac arrhythmia, unspecified: Secondary | ICD-10-CM | POA: Diagnosis not present

## 2016-10-24 ENCOUNTER — Ambulatory Visit (INDEPENDENT_AMBULATORY_CARE_PROVIDER_SITE_OTHER): Payer: Medicare Other

## 2016-10-24 ENCOUNTER — Other Ambulatory Visit: Payer: Self-pay

## 2016-10-24 DIAGNOSIS — C9210 Chronic myeloid leukemia, BCR/ABL-positive, not having achieved remission: Secondary | ICD-10-CM | POA: Diagnosis not present

## 2016-10-24 DIAGNOSIS — R011 Cardiac murmur, unspecified: Secondary | ICD-10-CM

## 2016-10-24 LAB — ECHOCARDIOGRAM COMPLETE
AVLVOTPG: 5 mmHg
CHL CUP DOP CALC LVOT VTI: 25.6 cm
CHL CUP MV DEC (S): 214
CHL CUP RV SYS PRESS: 34 mmHg
CHL CUP TV REG PEAK VELOCITY: 278 cm/s
E/e' ratio: 12.61
EWDT: 214 ms
FS: 46 % — AB (ref 28–44)
IVS/LV PW RATIO, ED: 0.97
LA diam end sys: 47 mm
LA diam index: 2.49 cm/m2
LA vol A4C: 54.1 ml
LA vol: 60.2 mL
LASIZE: 47 mm
LAVOLIN: 31.9 mL/m2
LDCA: 2.54 cm2
LV E/e' medial: 12.61
LV E/e'average: 12.61
LV TDI E'MEDIAL: 5.9
LV dias vol: 82 mL (ref 46–106)
LV e' LATERAL: 9.28 cm/s
LV sys vol: 31 mL (ref 14–42)
LVDIAVOLIN: 44 mL/m2
LVOT SV: 65 mL
LVOT peak vel: 113 cm/s
LVOTD: 18 mm
LVSYSVOLIN: 16 mL/m2
Lateral S' vel: 15.6 cm/s
MV Peak grad: 5 mmHg
MV pk A vel: 92 m/s
MV pk E vel: 117 m/s
P 1/2 time: 1311 ms
PW: 8.91 mm — AB (ref 0.6–1.1)
Simpson's disk: 63
Stroke v: 52 ml
TDI e' lateral: 9.28
TRMAXVEL: 278 cm/s

## 2016-11-05 DIAGNOSIS — D3A00 Benign carcinoid tumor of unspecified site: Secondary | ICD-10-CM | POA: Diagnosis not present

## 2016-11-05 DIAGNOSIS — C787 Secondary malignant neoplasm of liver and intrahepatic bile duct: Secondary | ICD-10-CM | POA: Diagnosis not present

## 2016-11-07 DIAGNOSIS — C7A8 Other malignant neuroendocrine tumors: Secondary | ICD-10-CM | POA: Diagnosis not present

## 2016-11-07 DIAGNOSIS — D3A00 Benign carcinoid tumor of unspecified site: Secondary | ICD-10-CM | POA: Diagnosis not present

## 2016-11-07 DIAGNOSIS — Z79899 Other long term (current) drug therapy: Secondary | ICD-10-CM | POA: Diagnosis not present

## 2016-11-08 DIAGNOSIS — D3A00 Benign carcinoid tumor of unspecified site: Secondary | ICD-10-CM | POA: Diagnosis not present

## 2016-11-15 DIAGNOSIS — C9210 Chronic myeloid leukemia, BCR/ABL-positive, not having achieved remission: Secondary | ICD-10-CM | POA: Diagnosis not present

## 2016-11-19 DIAGNOSIS — C9210 Chronic myeloid leukemia, BCR/ABL-positive, not having achieved remission: Secondary | ICD-10-CM | POA: Diagnosis not present

## 2016-12-05 DIAGNOSIS — D3A00 Benign carcinoid tumor of unspecified site: Secondary | ICD-10-CM | POA: Diagnosis not present

## 2016-12-27 DIAGNOSIS — G894 Chronic pain syndrome: Secondary | ICD-10-CM | POA: Diagnosis not present

## 2016-12-27 DIAGNOSIS — R109 Unspecified abdominal pain: Secondary | ICD-10-CM | POA: Diagnosis not present

## 2016-12-27 DIAGNOSIS — G893 Neoplasm related pain (acute) (chronic): Secondary | ICD-10-CM | POA: Diagnosis not present

## 2016-12-27 DIAGNOSIS — M25511 Pain in right shoulder: Secondary | ICD-10-CM | POA: Diagnosis not present

## 2017-01-02 DIAGNOSIS — C9210 Chronic myeloid leukemia, BCR/ABL-positive, not having achieved remission: Secondary | ICD-10-CM | POA: Diagnosis not present

## 2017-01-02 DIAGNOSIS — C787 Secondary malignant neoplasm of liver and intrahepatic bile duct: Secondary | ICD-10-CM | POA: Diagnosis not present

## 2017-01-02 DIAGNOSIS — C7A00 Malignant carcinoid tumor of unspecified site: Secondary | ICD-10-CM | POA: Diagnosis not present

## 2017-01-02 DIAGNOSIS — D3A00 Benign carcinoid tumor of unspecified site: Secondary | ICD-10-CM | POA: Diagnosis not present

## 2017-01-10 DIAGNOSIS — C9210 Chronic myeloid leukemia, BCR/ABL-positive, not having achieved remission: Secondary | ICD-10-CM | POA: Diagnosis not present

## 2017-01-10 DIAGNOSIS — I10 Essential (primary) hypertension: Secondary | ICD-10-CM | POA: Diagnosis not present

## 2017-01-14 DIAGNOSIS — R7989 Other specified abnormal findings of blood chemistry: Secondary | ICD-10-CM | POA: Diagnosis not present

## 2017-01-14 DIAGNOSIS — R1013 Epigastric pain: Secondary | ICD-10-CM | POA: Diagnosis not present

## 2017-01-14 DIAGNOSIS — K5900 Constipation, unspecified: Secondary | ICD-10-CM | POA: Diagnosis not present

## 2017-01-14 DIAGNOSIS — E611 Iron deficiency: Secondary | ICD-10-CM | POA: Diagnosis not present

## 2017-01-22 DIAGNOSIS — D510 Vitamin B12 deficiency anemia due to intrinsic factor deficiency: Secondary | ICD-10-CM | POA: Diagnosis not present

## 2017-01-22 DIAGNOSIS — I10 Essential (primary) hypertension: Secondary | ICD-10-CM | POA: Diagnosis not present

## 2017-01-24 DIAGNOSIS — F419 Anxiety disorder, unspecified: Secondary | ICD-10-CM | POA: Diagnosis not present

## 2017-01-24 DIAGNOSIS — G43909 Migraine, unspecified, not intractable, without status migrainosus: Secondary | ICD-10-CM | POA: Diagnosis not present

## 2017-01-24 DIAGNOSIS — G894 Chronic pain syndrome: Secondary | ICD-10-CM | POA: Diagnosis not present

## 2017-01-24 DIAGNOSIS — Z6831 Body mass index (BMI) 31.0-31.9, adult: Secondary | ICD-10-CM | POA: Diagnosis not present

## 2017-01-24 DIAGNOSIS — Z1389 Encounter for screening for other disorder: Secondary | ICD-10-CM | POA: Diagnosis not present

## 2017-01-24 DIAGNOSIS — I10 Essential (primary) hypertension: Secondary | ICD-10-CM | POA: Diagnosis not present

## 2017-01-24 DIAGNOSIS — E063 Autoimmune thyroiditis: Secondary | ICD-10-CM | POA: Diagnosis not present

## 2017-01-29 DIAGNOSIS — Z23 Encounter for immunization: Secondary | ICD-10-CM | POA: Diagnosis not present

## 2017-01-29 DIAGNOSIS — Z0130 Encounter for examination of blood pressure without abnormal findings: Secondary | ICD-10-CM | POA: Diagnosis not present

## 2017-01-30 DIAGNOSIS — C9210 Chronic myeloid leukemia, BCR/ABL-positive, not having achieved remission: Secondary | ICD-10-CM | POA: Diagnosis not present

## 2017-01-30 DIAGNOSIS — C7A8 Other malignant neuroendocrine tumors: Secondary | ICD-10-CM | POA: Diagnosis not present

## 2017-02-04 DIAGNOSIS — M79605 Pain in left leg: Secondary | ICD-10-CM | POA: Diagnosis not present

## 2017-02-04 DIAGNOSIS — F419 Anxiety disorder, unspecified: Secondary | ICD-10-CM | POA: Diagnosis not present

## 2017-02-04 DIAGNOSIS — S20229A Contusion of unspecified back wall of thorax, initial encounter: Secondary | ICD-10-CM | POA: Diagnosis not present

## 2017-02-04 DIAGNOSIS — M797 Fibromyalgia: Secondary | ICD-10-CM | POA: Diagnosis not present

## 2017-02-04 DIAGNOSIS — M1992 Post-traumatic osteoarthritis, unspecified site: Secondary | ICD-10-CM | POA: Diagnosis not present

## 2017-02-04 DIAGNOSIS — R52 Pain, unspecified: Secondary | ICD-10-CM | POA: Diagnosis not present

## 2017-02-04 DIAGNOSIS — M546 Pain in thoracic spine: Secondary | ICD-10-CM | POA: Diagnosis not present

## 2017-02-04 DIAGNOSIS — W01198A Fall on same level from slipping, tripping and stumbling with subsequent striking against other object, initial encounter: Secondary | ICD-10-CM | POA: Diagnosis not present

## 2017-02-04 DIAGNOSIS — Z79899 Other long term (current) drug therapy: Secondary | ICD-10-CM | POA: Diagnosis not present

## 2017-02-04 DIAGNOSIS — M1990 Unspecified osteoarthritis, unspecified site: Secondary | ICD-10-CM | POA: Diagnosis not present

## 2017-02-04 DIAGNOSIS — S7002XA Contusion of left hip, initial encounter: Secondary | ICD-10-CM | POA: Diagnosis not present

## 2017-02-04 DIAGNOSIS — M25552 Pain in left hip: Secondary | ICD-10-CM | POA: Diagnosis not present

## 2017-02-04 DIAGNOSIS — S299XXA Unspecified injury of thorax, initial encounter: Secondary | ICD-10-CM | POA: Diagnosis not present

## 2017-02-04 DIAGNOSIS — F329 Major depressive disorder, single episode, unspecified: Secondary | ICD-10-CM | POA: Diagnosis not present

## 2017-02-04 DIAGNOSIS — I10 Essential (primary) hypertension: Secondary | ICD-10-CM | POA: Diagnosis not present

## 2017-02-04 DIAGNOSIS — M545 Low back pain: Secondary | ICD-10-CM | POA: Diagnosis not present

## 2017-02-25 DIAGNOSIS — K219 Gastro-esophageal reflux disease without esophagitis: Secondary | ICD-10-CM | POA: Diagnosis not present

## 2017-02-25 DIAGNOSIS — K589 Irritable bowel syndrome without diarrhea: Secondary | ICD-10-CM | POA: Diagnosis not present

## 2017-02-25 DIAGNOSIS — G4733 Obstructive sleep apnea (adult) (pediatric): Secondary | ICD-10-CM | POA: Diagnosis not present

## 2017-02-25 DIAGNOSIS — C9210 Chronic myeloid leukemia, BCR/ABL-positive, not having achieved remission: Secondary | ICD-10-CM | POA: Diagnosis not present

## 2017-02-25 DIAGNOSIS — I10 Essential (primary) hypertension: Secondary | ICD-10-CM | POA: Diagnosis not present

## 2017-02-25 DIAGNOSIS — E785 Hyperlipidemia, unspecified: Secondary | ICD-10-CM | POA: Diagnosis not present

## 2017-02-27 DIAGNOSIS — C7A8 Other malignant neuroendocrine tumors: Secondary | ICD-10-CM | POA: Diagnosis not present

## 2017-03-04 DIAGNOSIS — C7A8 Other malignant neuroendocrine tumors: Secondary | ICD-10-CM | POA: Diagnosis not present

## 2017-03-04 DIAGNOSIS — D3A00 Benign carcinoid tumor of unspecified site: Secondary | ICD-10-CM | POA: Diagnosis not present

## 2017-03-04 DIAGNOSIS — C9210 Chronic myeloid leukemia, BCR/ABL-positive, not having achieved remission: Secondary | ICD-10-CM | POA: Diagnosis not present

## 2017-03-05 DIAGNOSIS — I700 Atherosclerosis of aorta: Secondary | ICD-10-CM | POA: Diagnosis not present

## 2017-03-05 DIAGNOSIS — Z96641 Presence of right artificial hip joint: Secondary | ICD-10-CM | POA: Diagnosis not present

## 2017-03-05 DIAGNOSIS — K8689 Other specified diseases of pancreas: Secondary | ICD-10-CM | POA: Diagnosis not present

## 2017-03-05 DIAGNOSIS — I2510 Atherosclerotic heart disease of native coronary artery without angina pectoris: Secondary | ICD-10-CM | POA: Diagnosis not present

## 2017-03-05 DIAGNOSIS — D7389 Other diseases of spleen: Secondary | ICD-10-CM | POA: Diagnosis not present

## 2017-03-05 DIAGNOSIS — C7A8 Other malignant neuroendocrine tumors: Secondary | ICD-10-CM | POA: Diagnosis not present

## 2017-03-05 DIAGNOSIS — Z90710 Acquired absence of both cervix and uterus: Secondary | ICD-10-CM | POA: Diagnosis not present

## 2017-03-05 DIAGNOSIS — C787 Secondary malignant neoplasm of liver and intrahepatic bile duct: Secondary | ICD-10-CM | POA: Diagnosis not present

## 2017-03-06 DIAGNOSIS — G64 Other disorders of peripheral nervous system: Secondary | ICD-10-CM | POA: Diagnosis not present

## 2017-03-06 DIAGNOSIS — K219 Gastro-esophageal reflux disease without esophagitis: Secondary | ICD-10-CM | POA: Diagnosis not present

## 2017-03-06 DIAGNOSIS — Z6831 Body mass index (BMI) 31.0-31.9, adult: Secondary | ICD-10-CM | POA: Diagnosis not present

## 2017-03-06 DIAGNOSIS — R946 Abnormal results of thyroid function studies: Secondary | ICD-10-CM | POA: Diagnosis not present

## 2017-03-06 DIAGNOSIS — R109 Unspecified abdominal pain: Secondary | ICD-10-CM | POA: Diagnosis not present

## 2017-03-06 DIAGNOSIS — F419 Anxiety disorder, unspecified: Secondary | ICD-10-CM | POA: Diagnosis not present

## 2017-03-06 DIAGNOSIS — E6609 Other obesity due to excess calories: Secondary | ICD-10-CM | POA: Diagnosis not present

## 2017-03-14 DIAGNOSIS — G893 Neoplasm related pain (acute) (chronic): Secondary | ICD-10-CM | POA: Diagnosis not present

## 2017-03-14 DIAGNOSIS — M25511 Pain in right shoulder: Secondary | ICD-10-CM | POA: Diagnosis not present

## 2017-03-14 DIAGNOSIS — R109 Unspecified abdominal pain: Secondary | ICD-10-CM | POA: Diagnosis not present

## 2017-03-14 DIAGNOSIS — M79604 Pain in right leg: Secondary | ICD-10-CM | POA: Diagnosis not present

## 2017-03-15 DIAGNOSIS — D649 Anemia, unspecified: Secondary | ICD-10-CM | POA: Diagnosis not present

## 2017-03-15 DIAGNOSIS — D509 Iron deficiency anemia, unspecified: Secondary | ICD-10-CM | POA: Diagnosis not present

## 2017-03-20 ENCOUNTER — Ambulatory Visit (INDEPENDENT_AMBULATORY_CARE_PROVIDER_SITE_OTHER): Payer: Medicare Other | Admitting: Orthopaedic Surgery

## 2017-03-21 DIAGNOSIS — D3A00 Benign carcinoid tumor of unspecified site: Secondary | ICD-10-CM | POA: Diagnosis not present

## 2017-04-08 DIAGNOSIS — Z5111 Encounter for antineoplastic chemotherapy: Secondary | ICD-10-CM | POA: Diagnosis not present

## 2017-04-08 DIAGNOSIS — D3A00 Benign carcinoid tumor of unspecified site: Secondary | ICD-10-CM | POA: Diagnosis not present

## 2017-04-08 DIAGNOSIS — C7A8 Other malignant neuroendocrine tumors: Secondary | ICD-10-CM | POA: Diagnosis not present

## 2017-04-19 DIAGNOSIS — C258 Malignant neoplasm of overlapping sites of pancreas: Secondary | ICD-10-CM | POA: Diagnosis not present

## 2017-04-26 DIAGNOSIS — I10 Essential (primary) hypertension: Secondary | ICD-10-CM | POA: Diagnosis not present

## 2017-04-26 DIAGNOSIS — Z6832 Body mass index (BMI) 32.0-32.9, adult: Secondary | ICD-10-CM | POA: Diagnosis not present

## 2017-04-26 DIAGNOSIS — E669 Obesity, unspecified: Secondary | ICD-10-CM | POA: Diagnosis not present

## 2017-04-26 DIAGNOSIS — C7B00 Secondary carcinoid tumors, unspecified site: Secondary | ICD-10-CM | POA: Diagnosis not present

## 2017-05-08 DIAGNOSIS — C7A00 Malignant carcinoid tumor of unspecified site: Secondary | ICD-10-CM | POA: Diagnosis not present

## 2017-05-08 DIAGNOSIS — D3A00 Benign carcinoid tumor of unspecified site: Secondary | ICD-10-CM | POA: Diagnosis not present

## 2017-05-08 DIAGNOSIS — C258 Malignant neoplasm of overlapping sites of pancreas: Secondary | ICD-10-CM | POA: Diagnosis not present

## 2017-05-08 DIAGNOSIS — C7A8 Other malignant neuroendocrine tumors: Secondary | ICD-10-CM | POA: Diagnosis not present

## 2017-05-15 ENCOUNTER — Telehealth: Payer: Self-pay | Admitting: Cardiology

## 2017-05-15 NOTE — Telephone Encounter (Signed)
Patient states last three days HR has been 37. Patient states she is dizzy and having shortness of breath. Oncologist told patient to contact cardiologist. Only new medication is labetalol 100 mg BID from PCP. Patient states BP is 138/65. Patient very concerned about low heart rate.

## 2017-05-15 NOTE — Telephone Encounter (Signed)
Patient called stating that her pulse is very low per her Oncologist. She was advised to see Heart doctor. Patient states that she feels like her Heart is cramping at times and her breathing is slow.  Please call [phone].

## 2017-05-16 NOTE — Telephone Encounter (Signed)
Her heart rate tends to be low in the high 40s, I think the labetalol has made it even slower. Needs to stop labetalol. If symptoms don't improve off labealol needs ER evaluation. Can we do a nursing visit with EKG tomorrow afternoon   J BrancH MD

## 2017-05-16 NOTE — Telephone Encounter (Signed)
Patient notified. And will come Monday for EKG and nurse visit

## 2017-05-20 ENCOUNTER — Ambulatory Visit (INDEPENDENT_AMBULATORY_CARE_PROVIDER_SITE_OTHER): Payer: Medicare Other | Admitting: *Deleted

## 2017-05-20 VITALS — BP 138/82 | HR 44

## 2017-05-20 DIAGNOSIS — R001 Bradycardia, unspecified: Secondary | ICD-10-CM

## 2017-05-20 NOTE — Progress Notes (Signed)
Patient in office this morning for EKG & vitals.  See EKG scanned into Epic.  Patient doing Amlodipine 10mg  daily.  See list of BP readings that patient brought with her today:  05/15/17 - 171/63  42  3:00 pm 05/16/17 - 158/65  44  3:00 pm 05/17/17 - 159/66  45  3:00 pm  05/18/17 - 139/54  48  3:00 pm 05/19/17 - 143/64  51  12:00 pm 05/20/17 - 143/64  50  12:00 pm

## 2017-05-22 NOTE — Progress Notes (Signed)
Heart rates remain low, clarify she is off labetalol. IS she having any lightheadedness or dizziness. Can she see me next week in [place], [place] wither Wed or Dixon Boos MD

## 2017-05-22 NOTE — Progress Notes (Signed)
Patient notified.  Stated she does notice some lightheadedness  Occasionally.  Confirmed that she is off of the Labetalol.  She will come to [facility] next Wednesday at 3:40 for OV with Dr. Harl Bowie as requested.

## 2017-05-27 DIAGNOSIS — C9210 Chronic myeloid leukemia, BCR/ABL-positive, not having achieved remission: Secondary | ICD-10-CM | POA: Diagnosis not present

## 2017-05-27 DIAGNOSIS — Z79899 Other long term (current) drug therapy: Secondary | ICD-10-CM | POA: Diagnosis not present

## 2017-05-27 DIAGNOSIS — D473 Essential (hemorrhagic) thrombocythemia: Secondary | ICD-10-CM | POA: Diagnosis not present

## 2017-05-27 DIAGNOSIS — D3A8 Other benign neuroendocrine tumors: Secondary | ICD-10-CM | POA: Diagnosis not present

## 2017-05-27 DIAGNOSIS — G43909 Migraine, unspecified, not intractable, without status migrainosus: Secondary | ICD-10-CM | POA: Diagnosis not present

## 2017-05-29 ENCOUNTER — Encounter: Payer: Self-pay | Admitting: Cardiology

## 2017-05-29 ENCOUNTER — Ambulatory Visit (INDEPENDENT_AMBULATORY_CARE_PROVIDER_SITE_OTHER): Payer: Medicare Other | Admitting: Cardiology

## 2017-05-29 ENCOUNTER — Ambulatory Visit: Payer: Medicare Other | Admitting: Physician Assistant

## 2017-05-29 VITALS — BP 142/70 | HR 47 | Ht 64.0 in | Wt 188.2 lb

## 2017-05-29 DIAGNOSIS — R600 Localized edema: Secondary | ICD-10-CM

## 2017-05-29 DIAGNOSIS — R002 Palpitations: Secondary | ICD-10-CM | POA: Diagnosis not present

## 2017-05-29 DIAGNOSIS — R001 Bradycardia, unspecified: Secondary | ICD-10-CM

## 2017-05-29 NOTE — Patient Instructions (Signed)
Your physician recommends that you schedule a follow-up appointment in: TO [place]  Your physician recommends that you continue on your current medications as directed. Please refer to the Current Medication list given to you today.  Your physician has recommended that you wear a holter monitor FOR 48 HOURS. Holter monitors are medical devices that record the heart's electrical activity. Doctors most often use these monitors to diagnose arrhythmias. Arrhythmias are problems with the speed or rhythm of the heartbeat. The monitor is a small, portable device. You can wear one while you do your normal daily activities. This is usually used to diagnose what is causing palpitations/syncope (passing out).  Your physician recommends that you return for lab work TSH - WE HAVE GIVEN YOU ORDERS TODAY  Thank you for choosing [facility]!!
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
## 2017-05-30 ENCOUNTER — Ambulatory Visit (INDEPENDENT_AMBULATORY_CARE_PROVIDER_SITE_OTHER): Payer: Medicare Other

## 2017-05-30 DIAGNOSIS — R001 Bradycardia, unspecified: Secondary | ICD-10-CM

## 2017-05-30 DIAGNOSIS — E039 Hypothyroidism, unspecified: Secondary | ICD-10-CM | POA: Diagnosis not present

## 2017-05-30 DIAGNOSIS — D510 Vitamin B12 deficiency anemia due to intrinsic factor deficiency: Secondary | ICD-10-CM | POA: Diagnosis not present

## 2017-06-02 ENCOUNTER — Encounter: Payer: Self-pay | Admitting: Cardiology

## 2017-06-03 DIAGNOSIS — C7A8 Other malignant neuroendocrine tumors: Secondary | ICD-10-CM | POA: Diagnosis not present

## 2017-06-03 DIAGNOSIS — D3A00 Benign carcinoid tumor of unspecified site: Secondary | ICD-10-CM | POA: Diagnosis not present

## 2017-06-03 DIAGNOSIS — Z5111 Encounter for antineoplastic chemotherapy: Secondary | ICD-10-CM | POA: Diagnosis not present

## 2017-06-06 ENCOUNTER — Encounter: Payer: Self-pay | Admitting: *Deleted

## 2017-06-06 ENCOUNTER — Telehealth: Payer: Self-pay | Admitting: Cardiology

## 2017-06-06 DIAGNOSIS — R109 Unspecified abdominal pain: Secondary | ICD-10-CM | POA: Diagnosis not present

## 2017-06-06 DIAGNOSIS — G893 Neoplasm related pain (acute) (chronic): Secondary | ICD-10-CM | POA: Diagnosis not present

## 2017-06-06 DIAGNOSIS — M545 Low back pain: Secondary | ICD-10-CM | POA: Diagnosis not present

## 2017-06-06 DIAGNOSIS — G8929 Other chronic pain: Secondary | ICD-10-CM | POA: Diagnosis not present

## 2017-06-06 NOTE — Telephone Encounter (Signed)
Patient called requesting test results of holter monitor and blood test. Please call [phone]

## 2017-06-07 ENCOUNTER — Telehealth: Payer: Self-pay | Admitting: *Deleted

## 2017-06-07 NOTE — Telephone Encounter (Signed)
Pt aware and voiced understanding - says labs were done last week (already requested) - routed monitor results to pcp

## 2017-06-07 NOTE — Telephone Encounter (Signed)
-----   Message from Arnoldo Lenis, MD sent at 06/07/2017  1:17 PM EST ----- Holter shows her heart rates are on average in the high 40s. This is low but consistent with what her typical rates were in the past, overall rates have improved since stopping her beta blocker labetalol, and just something we will conitnue to monitor. As far as labs, did she go to get her repeat TSH done? The only labs we received were from 10/2016  Zandra Abts MD

## 2017-06-07 NOTE — Telephone Encounter (Signed)
Pt labs and Holter report scanned into chart for provider review - pt was made aware we were awaiting holter monitor results from [employer]. Routed to Dr Harl Bowie

## 2017-06-07 NOTE — Telephone Encounter (Signed)
Please see result note   Zandra Abts MD

## 2017-06-07 NOTE — Telephone Encounter (Signed)
See results phone note - pt made aware

## 2017-06-17 ENCOUNTER — Telehealth: Payer: Self-pay | Admitting: *Deleted

## 2017-06-17 NOTE — Telephone Encounter (Signed)
Calling for lab results of TSH.

## 2017-06-18 NOTE — Telephone Encounter (Signed)
The TSH does suggest her thyroid level may be a little low. Has her pcp addressed this recently, if not she will need to discuss with them possibly adjusting her synthroid dose   Zandra Abts MD

## 2017-06-19 ENCOUNTER — Telehealth: Payer: Self-pay | Admitting: Cardiology

## 2017-06-19 NOTE — Telephone Encounter (Signed)
Pt aware that labs were forwarded to Dr Gerarda Fraction and if he wanted anything changed that Dr Gerarda Fraction would giver her a call. Pt voiced understanding

## 2017-06-19 NOTE — Telephone Encounter (Signed)
Pt aware and voiced understanding - says Dr Gerarda Fraction has not adjusted levothyroxine and still taking 75 mcg daily - will forward lab results and this note to pcp

## 2017-06-19 NOTE — Telephone Encounter (Signed)
Patient called in regards to her low Thyroid. She wants to know if Dr. Harl Bowie is going to call in medication .  Please call [phone]

## 2017-07-02 DIAGNOSIS — Z510 Encounter for antineoplastic radiation therapy: Secondary | ICD-10-CM | POA: Diagnosis not present

## 2017-07-02 DIAGNOSIS — C7A8 Other malignant neuroendocrine tumors: Secondary | ICD-10-CM | POA: Diagnosis not present

## 2017-07-02 DIAGNOSIS — C7A00 Malignant carcinoid tumor of unspecified site: Secondary | ICD-10-CM | POA: Diagnosis not present

## 2017-07-02 DIAGNOSIS — D3A00 Benign carcinoid tumor of unspecified site: Secondary | ICD-10-CM | POA: Diagnosis not present

## 2017-07-10 DIAGNOSIS — C7A8 Other malignant neuroendocrine tumors: Secondary | ICD-10-CM | POA: Diagnosis not present

## 2017-07-10 DIAGNOSIS — D3A00 Benign carcinoid tumor of unspecified site: Secondary | ICD-10-CM | POA: Diagnosis not present

## 2017-07-10 DIAGNOSIS — R001 Bradycardia, unspecified: Secondary | ICD-10-CM | POA: Diagnosis not present

## 2017-07-10 DIAGNOSIS — C9210 Chronic myeloid leukemia, BCR/ABL-positive, not having achieved remission: Secondary | ICD-10-CM | POA: Diagnosis not present

## 2017-07-30 DIAGNOSIS — D3A00 Benign carcinoid tumor of unspecified site: Secondary | ICD-10-CM | POA: Diagnosis not present

## 2017-07-30 DIAGNOSIS — C7A8 Other malignant neuroendocrine tumors: Secondary | ICD-10-CM | POA: Diagnosis not present

## 2017-08-19 DIAGNOSIS — K219 Gastro-esophageal reflux disease without esophagitis: Secondary | ICD-10-CM | POA: Diagnosis not present

## 2017-08-19 DIAGNOSIS — I10 Essential (primary) hypertension: Secondary | ICD-10-CM | POA: Diagnosis not present

## 2017-08-19 DIAGNOSIS — R51 Headache: Secondary | ICD-10-CM | POA: Diagnosis not present

## 2017-08-19 DIAGNOSIS — G4733 Obstructive sleep apnea (adult) (pediatric): Secondary | ICD-10-CM | POA: Diagnosis not present

## 2017-08-19 DIAGNOSIS — C9210 Chronic myeloid leukemia, BCR/ABL-positive, not having achieved remission: Secondary | ICD-10-CM | POA: Diagnosis not present

## 2017-08-19 DIAGNOSIS — D473 Essential (hemorrhagic) thrombocythemia: Secondary | ICD-10-CM | POA: Diagnosis not present

## 2017-08-19 DIAGNOSIS — Z79899 Other long term (current) drug therapy: Secondary | ICD-10-CM | POA: Diagnosis not present

## 2017-08-19 DIAGNOSIS — C7A1 Malignant poorly differentiated neuroendocrine tumors: Secondary | ICD-10-CM | POA: Diagnosis not present

## 2017-08-19 DIAGNOSIS — K589 Irritable bowel syndrome without diarrhea: Secondary | ICD-10-CM | POA: Diagnosis not present

## 2017-08-19 DIAGNOSIS — D3A8 Other benign neuroendocrine tumors: Secondary | ICD-10-CM | POA: Diagnosis not present

## 2017-08-19 DIAGNOSIS — G43909 Migraine, unspecified, not intractable, without status migrainosus: Secondary | ICD-10-CM | POA: Diagnosis not present

## 2017-08-19 DIAGNOSIS — E785 Hyperlipidemia, unspecified: Secondary | ICD-10-CM | POA: Diagnosis not present

## 2017-08-27 DIAGNOSIS — F419 Anxiety disorder, unspecified: Secondary | ICD-10-CM | POA: Diagnosis not present

## 2017-08-27 DIAGNOSIS — C9590 Leukemia, unspecified not having achieved remission: Secondary | ICD-10-CM | POA: Diagnosis not present

## 2017-08-27 DIAGNOSIS — Z79899 Other long term (current) drug therapy: Secondary | ICD-10-CM | POA: Diagnosis not present

## 2017-08-27 DIAGNOSIS — M25562 Pain in left knee: Secondary | ICD-10-CM | POA: Diagnosis not present

## 2017-08-27 DIAGNOSIS — M5432 Sciatica, left side: Secondary | ICD-10-CM | POA: Diagnosis not present

## 2017-08-27 DIAGNOSIS — M797 Fibromyalgia: Secondary | ICD-10-CM | POA: Diagnosis not present

## 2017-08-27 DIAGNOSIS — M1612 Unilateral primary osteoarthritis, left hip: Secondary | ICD-10-CM | POA: Diagnosis not present

## 2017-08-27 DIAGNOSIS — F329 Major depressive disorder, single episode, unspecified: Secondary | ICD-10-CM | POA: Diagnosis not present

## 2017-08-27 DIAGNOSIS — Z96641 Presence of right artificial hip joint: Secondary | ICD-10-CM | POA: Diagnosis not present

## 2017-08-27 DIAGNOSIS — I10 Essential (primary) hypertension: Secondary | ICD-10-CM | POA: Diagnosis not present

## 2017-08-27 DIAGNOSIS — Z96651 Presence of right artificial knee joint: Secondary | ICD-10-CM | POA: Diagnosis not present

## 2017-08-27 DIAGNOSIS — M25552 Pain in left hip: Secondary | ICD-10-CM | POA: Diagnosis not present

## 2017-08-29 DIAGNOSIS — D3A00 Benign carcinoid tumor of unspecified site: Secondary | ICD-10-CM | POA: Diagnosis not present

## 2017-08-29 DIAGNOSIS — Z510 Encounter for antineoplastic radiation therapy: Secondary | ICD-10-CM | POA: Diagnosis not present

## 2017-08-29 DIAGNOSIS — C9210 Chronic myeloid leukemia, BCR/ABL-positive, not having achieved remission: Secondary | ICD-10-CM | POA: Diagnosis not present

## 2017-08-29 DIAGNOSIS — C7A00 Malignant carcinoid tumor of unspecified site: Secondary | ICD-10-CM | POA: Diagnosis not present

## 2017-08-29 DIAGNOSIS — C7A8 Other malignant neuroendocrine tumors: Secondary | ICD-10-CM | POA: Diagnosis not present

## 2017-09-18 DIAGNOSIS — G893 Neoplasm related pain (acute) (chronic): Secondary | ICD-10-CM | POA: Diagnosis not present

## 2017-09-18 DIAGNOSIS — Z5181 Encounter for therapeutic drug level monitoring: Secondary | ICD-10-CM | POA: Diagnosis not present

## 2017-09-18 DIAGNOSIS — M25561 Pain in right knee: Secondary | ICD-10-CM | POA: Diagnosis not present

## 2017-09-18 DIAGNOSIS — Z79899 Other long term (current) drug therapy: Secondary | ICD-10-CM | POA: Diagnosis not present

## 2017-09-18 DIAGNOSIS — R109 Unspecified abdominal pain: Secondary | ICD-10-CM | POA: Diagnosis not present

## 2017-09-18 DIAGNOSIS — M545 Low back pain: Secondary | ICD-10-CM | POA: Diagnosis not present

## 2017-09-30 DIAGNOSIS — C7B02 Secondary carcinoid tumors of liver: Secondary | ICD-10-CM | POA: Diagnosis not present

## 2017-09-30 DIAGNOSIS — C7A098 Malignant carcinoid tumors of other sites: Secondary | ICD-10-CM | POA: Diagnosis not present

## 2017-09-30 DIAGNOSIS — C9210 Chronic myeloid leukemia, BCR/ABL-positive, not having achieved remission: Secondary | ICD-10-CM | POA: Diagnosis not present

## 2017-09-30 DIAGNOSIS — D3A00 Benign carcinoid tumor of unspecified site: Secondary | ICD-10-CM | POA: Diagnosis not present

## 2017-10-02 DIAGNOSIS — Z1389 Encounter for screening for other disorder: Secondary | ICD-10-CM | POA: Diagnosis not present

## 2017-10-02 DIAGNOSIS — C9210 Chronic myeloid leukemia, BCR/ABL-positive, not having achieved remission: Secondary | ICD-10-CM | POA: Diagnosis not present

## 2017-10-17 DIAGNOSIS — R109 Unspecified abdominal pain: Secondary | ICD-10-CM | POA: Diagnosis not present

## 2017-10-17 DIAGNOSIS — G893 Neoplasm related pain (acute) (chronic): Secondary | ICD-10-CM | POA: Diagnosis not present

## 2017-10-17 DIAGNOSIS — M25561 Pain in right knee: Secondary | ICD-10-CM | POA: Diagnosis not present

## 2017-10-17 DIAGNOSIS — M545 Low back pain: Secondary | ICD-10-CM | POA: Diagnosis not present

## 2017-10-20 DIAGNOSIS — R319 Hematuria, unspecified: Secondary | ICD-10-CM | POA: Diagnosis not present

## 2017-10-20 DIAGNOSIS — N390 Urinary tract infection, site not specified: Secondary | ICD-10-CM | POA: Diagnosis not present

## 2017-10-20 DIAGNOSIS — F329 Major depressive disorder, single episode, unspecified: Secondary | ICD-10-CM | POA: Diagnosis not present

## 2017-10-20 DIAGNOSIS — M797 Fibromyalgia: Secondary | ICD-10-CM | POA: Diagnosis not present

## 2017-10-20 DIAGNOSIS — C9590 Leukemia, unspecified not having achieved remission: Secondary | ICD-10-CM | POA: Diagnosis not present

## 2017-10-20 DIAGNOSIS — Z79899 Other long term (current) drug therapy: Secondary | ICD-10-CM | POA: Diagnosis not present

## 2017-10-20 DIAGNOSIS — M1990 Unspecified osteoarthritis, unspecified site: Secondary | ICD-10-CM | POA: Diagnosis not present

## 2017-10-20 DIAGNOSIS — I10 Essential (primary) hypertension: Secondary | ICD-10-CM | POA: Diagnosis not present

## 2017-10-20 DIAGNOSIS — F419 Anxiety disorder, unspecified: Secondary | ICD-10-CM | POA: Diagnosis not present

## 2017-10-31 DIAGNOSIS — Z87440 Personal history of urinary (tract) infections: Secondary | ICD-10-CM | POA: Diagnosis not present

## 2017-10-31 DIAGNOSIS — C7A00 Malignant carcinoid tumor of unspecified site: Secondary | ICD-10-CM | POA: Diagnosis not present

## 2017-10-31 DIAGNOSIS — C7A8 Other malignant neuroendocrine tumors: Secondary | ICD-10-CM | POA: Diagnosis not present

## 2017-11-18 DIAGNOSIS — D3A8 Other benign neuroendocrine tumors: Secondary | ICD-10-CM | POA: Diagnosis not present

## 2017-11-18 DIAGNOSIS — G4733 Obstructive sleep apnea (adult) (pediatric): Secondary | ICD-10-CM | POA: Diagnosis not present

## 2017-11-18 DIAGNOSIS — Z79899 Other long term (current) drug therapy: Secondary | ICD-10-CM | POA: Diagnosis not present

## 2017-11-18 DIAGNOSIS — D473 Essential (hemorrhagic) thrombocythemia: Secondary | ICD-10-CM | POA: Diagnosis not present

## 2017-11-18 DIAGNOSIS — I10 Essential (primary) hypertension: Secondary | ICD-10-CM | POA: Diagnosis not present

## 2017-11-18 DIAGNOSIS — G43909 Migraine, unspecified, not intractable, without status migrainosus: Secondary | ICD-10-CM | POA: Diagnosis not present

## 2017-11-18 DIAGNOSIS — K219 Gastro-esophageal reflux disease without esophagitis: Secondary | ICD-10-CM | POA: Diagnosis not present

## 2017-11-18 DIAGNOSIS — K589 Irritable bowel syndrome without diarrhea: Secondary | ICD-10-CM | POA: Diagnosis not present

## 2017-11-18 DIAGNOSIS — E785 Hyperlipidemia, unspecified: Secondary | ICD-10-CM | POA: Diagnosis not present

## 2017-11-18 DIAGNOSIS — R001 Bradycardia, unspecified: Secondary | ICD-10-CM | POA: Diagnosis not present

## 2017-11-18 DIAGNOSIS — C9210 Chronic myeloid leukemia, BCR/ABL-positive, not having achieved remission: Secondary | ICD-10-CM | POA: Diagnosis not present

## 2017-11-19 DIAGNOSIS — K5909 Other constipation: Secondary | ICD-10-CM | POA: Diagnosis not present

## 2017-11-19 DIAGNOSIS — K5904 Chronic idiopathic constipation: Secondary | ICD-10-CM | POA: Diagnosis not present

## 2017-12-03 DIAGNOSIS — D3A00 Benign carcinoid tumor of unspecified site: Secondary | ICD-10-CM | POA: Diagnosis not present

## 2017-12-03 DIAGNOSIS — C7A8 Other malignant neuroendocrine tumors: Secondary | ICD-10-CM | POA: Diagnosis not present

## 2017-12-16 DIAGNOSIS — R109 Unspecified abdominal pain: Secondary | ICD-10-CM | POA: Diagnosis not present

## 2017-12-16 DIAGNOSIS — M545 Low back pain: Secondary | ICD-10-CM | POA: Diagnosis not present

## 2017-12-16 DIAGNOSIS — G8929 Other chronic pain: Secondary | ICD-10-CM | POA: Diagnosis not present

## 2017-12-16 DIAGNOSIS — M25561 Pain in right knee: Secondary | ICD-10-CM | POA: Diagnosis not present

## 2017-12-16 DIAGNOSIS — G893 Neoplasm related pain (acute) (chronic): Secondary | ICD-10-CM | POA: Diagnosis not present

## 2017-12-24 DIAGNOSIS — G894 Chronic pain syndrome: Secondary | ICD-10-CM | POA: Diagnosis not present

## 2017-12-24 DIAGNOSIS — Z1389 Encounter for screening for other disorder: Secondary | ICD-10-CM | POA: Diagnosis not present

## 2017-12-24 DIAGNOSIS — E6609 Other obesity due to excess calories: Secondary | ICD-10-CM | POA: Diagnosis not present

## 2017-12-24 DIAGNOSIS — E559 Vitamin D deficiency, unspecified: Secondary | ICD-10-CM | POA: Diagnosis not present

## 2017-12-24 DIAGNOSIS — C92Z0 Other myeloid leukemia not having achieved remission: Secondary | ICD-10-CM | POA: Diagnosis not present

## 2017-12-24 DIAGNOSIS — E063 Autoimmune thyroiditis: Secondary | ICD-10-CM | POA: Diagnosis not present

## 2017-12-24 DIAGNOSIS — E669 Obesity, unspecified: Secondary | ICD-10-CM | POA: Diagnosis not present

## 2017-12-24 DIAGNOSIS — Z0001 Encounter for general adult medical examination with abnormal findings: Secondary | ICD-10-CM | POA: Diagnosis not present

## 2017-12-24 DIAGNOSIS — R946 Abnormal results of thyroid function studies: Secondary | ICD-10-CM | POA: Diagnosis not present

## 2017-12-24 DIAGNOSIS — K219 Gastro-esophageal reflux disease without esophagitis: Secondary | ICD-10-CM | POA: Diagnosis not present

## 2017-12-24 DIAGNOSIS — F419 Anxiety disorder, unspecified: Secondary | ICD-10-CM | POA: Diagnosis not present

## 2017-12-24 DIAGNOSIS — Z6834 Body mass index (BMI) 34.0-34.9, adult: Secondary | ICD-10-CM | POA: Diagnosis not present

## 2017-12-24 DIAGNOSIS — I10 Essential (primary) hypertension: Secondary | ICD-10-CM | POA: Diagnosis not present

## 2017-12-24 DIAGNOSIS — C7A8 Other malignant neuroendocrine tumors: Secondary | ICD-10-CM | POA: Diagnosis not present

## 2017-12-24 DIAGNOSIS — D519 Vitamin B12 deficiency anemia, unspecified: Secondary | ICD-10-CM | POA: Diagnosis not present

## 2017-12-24 DIAGNOSIS — H532 Diplopia: Secondary | ICD-10-CM | POA: Diagnosis not present

## 2018-01-08 DIAGNOSIS — D473 Essential (hemorrhagic) thrombocythemia: Secondary | ICD-10-CM | POA: Diagnosis not present

## 2018-01-08 DIAGNOSIS — C7A098 Malignant carcinoid tumors of other sites: Secondary | ICD-10-CM | POA: Diagnosis not present

## 2018-01-08 DIAGNOSIS — C9210 Chronic myeloid leukemia, BCR/ABL-positive, not having achieved remission: Secondary | ICD-10-CM | POA: Diagnosis not present

## 2018-01-16 DIAGNOSIS — D510 Vitamin B12 deficiency anemia due to intrinsic factor deficiency: Secondary | ICD-10-CM | POA: Diagnosis not present

## 2018-02-04 DIAGNOSIS — R300 Dysuria: Secondary | ICD-10-CM | POA: Diagnosis not present

## 2018-02-04 DIAGNOSIS — R5383 Other fatigue: Secondary | ICD-10-CM | POA: Diagnosis not present

## 2018-02-04 DIAGNOSIS — E063 Autoimmune thyroiditis: Secondary | ICD-10-CM | POA: Diagnosis not present

## 2018-02-04 DIAGNOSIS — E039 Hypothyroidism, unspecified: Secondary | ICD-10-CM | POA: Diagnosis not present

## 2018-02-04 DIAGNOSIS — Z6834 Body mass index (BMI) 34.0-34.9, adult: Secondary | ICD-10-CM | POA: Diagnosis not present

## 2018-02-04 DIAGNOSIS — R3911 Hesitancy of micturition: Secondary | ICD-10-CM | POA: Diagnosis not present

## 2018-02-05 DIAGNOSIS — C7B8 Other secondary neuroendocrine tumors: Secondary | ICD-10-CM | POA: Diagnosis not present

## 2018-02-05 DIAGNOSIS — Z79899 Other long term (current) drug therapy: Secondary | ICD-10-CM | POA: Diagnosis not present

## 2018-02-05 DIAGNOSIS — C7A098 Malignant carcinoid tumors of other sites: Secondary | ICD-10-CM | POA: Diagnosis not present

## 2018-02-05 DIAGNOSIS — I899 Noninfective disorder of lymphatic vessels and lymph nodes, unspecified: Secondary | ICD-10-CM | POA: Diagnosis not present

## 2018-02-05 DIAGNOSIS — K869 Disease of pancreas, unspecified: Secondary | ICD-10-CM | POA: Diagnosis not present

## 2018-02-05 DIAGNOSIS — C787 Secondary malignant neoplasm of liver and intrahepatic bile duct: Secondary | ICD-10-CM | POA: Diagnosis not present

## 2018-02-20 DIAGNOSIS — K5904 Chronic idiopathic constipation: Secondary | ICD-10-CM | POA: Diagnosis not present

## 2018-02-25 DIAGNOSIS — M546 Pain in thoracic spine: Secondary | ICD-10-CM | POA: Diagnosis not present

## 2018-02-25 DIAGNOSIS — C7A00 Malignant carcinoid tumor of unspecified site: Secondary | ICD-10-CM | POA: Diagnosis not present

## 2018-03-03 ENCOUNTER — Telehealth (INDEPENDENT_AMBULATORY_CARE_PROVIDER_SITE_OTHER): Payer: Self-pay | Admitting: Orthopaedic Surgery

## 2018-03-03 NOTE — Telephone Encounter (Signed)
Faxed note to provided number

## 2018-03-03 NOTE — Telephone Encounter (Signed)
Patient called asked if a note can be faxed over to [facility] stating her appointment can not be canceled or rescheduled. The fax# to [facility] is [phone] The number to contact patient is [phone]  Patient said the office will close at 3:30pm.

## 2018-03-05 DIAGNOSIS — C7A098 Malignant carcinoid tumors of other sites: Secondary | ICD-10-CM | POA: Diagnosis not present

## 2018-03-05 DIAGNOSIS — M549 Dorsalgia, unspecified: Secondary | ICD-10-CM | POA: Diagnosis not present

## 2018-03-05 DIAGNOSIS — Z79899 Other long term (current) drug therapy: Secondary | ICD-10-CM | POA: Diagnosis not present

## 2018-03-05 DIAGNOSIS — C7A00 Malignant carcinoid tumor of unspecified site: Secondary | ICD-10-CM | POA: Diagnosis not present

## 2018-03-06 ENCOUNTER — Ambulatory Visit (INDEPENDENT_AMBULATORY_CARE_PROVIDER_SITE_OTHER): Payer: Medicare Other

## 2018-03-06 ENCOUNTER — Encounter (INDEPENDENT_AMBULATORY_CARE_PROVIDER_SITE_OTHER): Payer: Self-pay | Admitting: Orthopaedic Surgery

## 2018-03-06 ENCOUNTER — Ambulatory Visit (INDEPENDENT_AMBULATORY_CARE_PROVIDER_SITE_OTHER): Payer: Medicare Other | Admitting: Orthopaedic Surgery

## 2018-03-06 DIAGNOSIS — G8929 Other chronic pain: Secondary | ICD-10-CM

## 2018-03-06 DIAGNOSIS — M545 Low back pain: Secondary | ICD-10-CM

## 2018-03-06 DIAGNOSIS — M549 Dorsalgia, unspecified: Secondary | ICD-10-CM | POA: Diagnosis not present
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
## 2018-03-12 DIAGNOSIS — Z79899 Other long term (current) drug therapy: Secondary | ICD-10-CM | POA: Diagnosis not present

## 2018-03-12 DIAGNOSIS — Z5181 Encounter for therapeutic drug level monitoring: Secondary | ICD-10-CM | POA: Diagnosis not present

## 2018-03-12 DIAGNOSIS — K5903 Drug induced constipation: Secondary | ICD-10-CM | POA: Diagnosis not present

## 2018-03-12 DIAGNOSIS — M545 Low back pain: Secondary | ICD-10-CM | POA: Diagnosis not present

## 2018-03-12 DIAGNOSIS — G893 Neoplasm related pain (acute) (chronic): Secondary | ICD-10-CM | POA: Diagnosis not present

## 2018-03-12 DIAGNOSIS — R109 Unspecified abdominal pain: Secondary | ICD-10-CM | POA: Diagnosis not present

## 2018-03-17 DIAGNOSIS — C9210 Chronic myeloid leukemia, BCR/ABL-positive, not having achieved remission: Secondary | ICD-10-CM | POA: Diagnosis not present

## 2018-03-17 DIAGNOSIS — Z9989 Dependence on other enabling machines and devices: Secondary | ICD-10-CM | POA: Diagnosis not present

## 2018-03-17 DIAGNOSIS — I10 Essential (primary) hypertension: Secondary | ICD-10-CM | POA: Diagnosis not present

## 2018-03-17 DIAGNOSIS — K589 Irritable bowel syndrome without diarrhea: Secondary | ICD-10-CM | POA: Diagnosis not present

## 2018-03-17 DIAGNOSIS — E785 Hyperlipidemia, unspecified: Secondary | ICD-10-CM | POA: Diagnosis not present

## 2018-03-17 DIAGNOSIS — D3A8 Other benign neuroendocrine tumors: Secondary | ICD-10-CM | POA: Diagnosis not present

## 2018-03-17 DIAGNOSIS — K219 Gastro-esophageal reflux disease without esophagitis: Secondary | ICD-10-CM | POA: Diagnosis not present

## 2018-03-17 DIAGNOSIS — R51 Headache: Secondary | ICD-10-CM | POA: Diagnosis not present

## 2018-03-17 DIAGNOSIS — Z8669 Personal history of other diseases of the nervous system and sense organs: Secondary | ICD-10-CM | POA: Diagnosis not present

## 2018-03-17 DIAGNOSIS — R001 Bradycardia, unspecified: Secondary | ICD-10-CM | POA: Diagnosis not present

## 2018-03-17 DIAGNOSIS — G43909 Migraine, unspecified, not intractable, without status migrainosus: Secondary | ICD-10-CM | POA: Diagnosis not present

## 2018-03-17 DIAGNOSIS — G4733 Obstructive sleep apnea (adult) (pediatric): Secondary | ICD-10-CM | POA: Diagnosis not present

## 2018-03-17 DIAGNOSIS — Z79899 Other long term (current) drug therapy: Secondary | ICD-10-CM | POA: Diagnosis not present

## 2018-03-21 DIAGNOSIS — C9210 Chronic myeloid leukemia, BCR/ABL-positive, not having achieved remission: Secondary | ICD-10-CM | POA: Diagnosis not present

## 2018-03-28 ENCOUNTER — Ambulatory Visit
Admission: RE | Admit: 2018-03-28 | Discharge: 2018-03-28 | Disposition: A | Discharge status: Home / Self Care | Payer: Medicare Other | Source: Ambulatory Visit | Attending: Physician Assistant | Admitting: Physician Assistant

## 2018-03-28 DIAGNOSIS — M4727 Other spondylosis with radiculopathy, lumbosacral region: Secondary | ICD-10-CM | POA: Diagnosis not present

## 2018-03-28 DIAGNOSIS — M5117 Intervertebral disc disorders with radiculopathy, lumbosacral region: Secondary | ICD-10-CM | POA: Diagnosis not present

## 2018-03-28 DIAGNOSIS — M4726 Other spondylosis with radiculopathy, lumbar region: Secondary | ICD-10-CM | POA: Diagnosis not present

## 2018-03-28 DIAGNOSIS — M549 Dorsalgia, unspecified: Secondary | ICD-10-CM

## 2018-03-28 DIAGNOSIS — G8929 Other chronic pain: Secondary | ICD-10-CM

## 2018-03-28 DIAGNOSIS — M5116 Intervertebral disc disorders with radiculopathy, lumbar region: Secondary | ICD-10-CM | POA: Diagnosis not present

## 2018-03-28 DIAGNOSIS — M545 Low back pain: Principal | ICD-10-CM

## 2018-03-28 MED ORDER — GADOBENATE DIMEGLUMINE 529 MG/ML IV SOLN
18.0000 mL | Freq: Once | INTRAVENOUS | Status: AC | PRN
  Administered 2018-03-28: 18 mL via INTRAVENOUS

## 2018-04-03 DIAGNOSIS — C7A098 Malignant carcinoid tumors of other sites: Secondary | ICD-10-CM | POA: Diagnosis not present

## 2018-04-03 DIAGNOSIS — Z79899 Other long term (current) drug therapy: Secondary | ICD-10-CM | POA: Diagnosis not present

## 2018-04-08 DIAGNOSIS — C7B02 Secondary carcinoid tumors of liver: Secondary | ICD-10-CM | POA: Diagnosis not present

## 2018-04-08 DIAGNOSIS — C7A00 Malignant carcinoid tumor of unspecified site: Secondary | ICD-10-CM | POA: Diagnosis not present

## 2018-04-08 DIAGNOSIS — C7A098 Malignant carcinoid tumors of other sites: Secondary | ICD-10-CM | POA: Diagnosis not present

## 2018-04-14 ENCOUNTER — Ambulatory Visit (INDEPENDENT_AMBULATORY_CARE_PROVIDER_SITE_OTHER): Payer: Medicare Other | Admitting: Physician Assistant

## 2018-04-14 ENCOUNTER — Encounter (INDEPENDENT_AMBULATORY_CARE_PROVIDER_SITE_OTHER): Payer: Self-pay | Admitting: Physician Assistant

## 2018-04-14 DIAGNOSIS — M545 Low back pain, unspecified: Secondary | ICD-10-CM

## 2018-04-14 DIAGNOSIS — G8929 Other chronic pain: Secondary | ICD-10-CM

## 2018-04-14 NOTE — Progress Notes (Signed)
HPI: Margaret Cowan returns today to go over MRI of her lumbar spine.  She is had no change in symptoms.  Still having low back pain with prolonged walking.  She does have neuropathy unable to tell if she is having any radicular symptoms down either leg.  Main complaint continues to the low mid back pain with prolonged walking or standing.  Review of systems: No bowel or bladder dysfunction.  No recent fevers chills.  Please see HPI otherwise negative  MRI of the lumbar spine dated 03/28/2018 is reviewed with the patient shows no metastatic disease or worrisome lesions.  No acute fractures.  MRI is basically unchanged from previous MRI.  Multilevel facet arthropathy worse at L4-5 with advanced bilateral degenerative changes.  No spinal stenosis.  Physical exam: General well-developed well-nourished female no acute distress mood affect appropriate.  Psych alert x3 Lower extremities: 5 out of 5 strength throughout lower extremities against resistance.  Negative straight leg raise bilaterally.  Impression: Facet arthropathy lumbar spine multilevel  Plan: Due to the fact the patient's had previous injections lumbar spine by Dr. Benjie Karvonen will refer back to Dr. Raynelle Chary for possible facet injections lumbar spine.  Should follow-up with [place] on as-needed basis.  Questions encouraged and answered.

## 2018-04-15 ENCOUNTER — Other Ambulatory Visit (INDEPENDENT_AMBULATORY_CARE_PROVIDER_SITE_OTHER): Payer: Self-pay

## 2018-04-15 DIAGNOSIS — G8929 Other chronic pain: Secondary | ICD-10-CM

## 2018-04-15 DIAGNOSIS — M545 Low back pain: Principal | ICD-10-CM

## 2018-04-16 ENCOUNTER — Other Ambulatory Visit (INDEPENDENT_AMBULATORY_CARE_PROVIDER_SITE_OTHER): Payer: Self-pay | Admitting: Radiology

## 2018-04-16 DIAGNOSIS — E669 Obesity, unspecified: Secondary | ICD-10-CM | POA: Diagnosis not present

## 2018-04-16 DIAGNOSIS — G8929 Other chronic pain: Secondary | ICD-10-CM

## 2018-04-16 DIAGNOSIS — R5383 Other fatigue: Secondary | ICD-10-CM | POA: Diagnosis not present

## 2018-04-16 DIAGNOSIS — M545 Low back pain: Principal | ICD-10-CM

## 2018-04-16 DIAGNOSIS — I10 Essential (primary) hypertension: Secondary | ICD-10-CM | POA: Diagnosis not present

## 2018-04-16 DIAGNOSIS — C7B00 Secondary carcinoid tumors, unspecified site: Secondary | ICD-10-CM | POA: Diagnosis not present

## 2018-04-16 DIAGNOSIS — R946 Abnormal results of thyroid function studies: Secondary | ICD-10-CM | POA: Diagnosis not present

## 2018-04-16 DIAGNOSIS — Z6833 Body mass index (BMI) 33.0-33.9, adult: Secondary | ICD-10-CM | POA: Diagnosis not present

## 2018-04-16 DIAGNOSIS — K219 Gastro-esophageal reflux disease without esophagitis: Secondary | ICD-10-CM | POA: Diagnosis not present

## 2018-04-16 DIAGNOSIS — Z1389 Encounter for screening for other disorder: Secondary | ICD-10-CM | POA: Diagnosis not present

## 2018-04-16 DIAGNOSIS — Z23 Encounter for immunization: Secondary | ICD-10-CM | POA: Diagnosis not present

## 2018-04-16 DIAGNOSIS — D519 Vitamin B12 deficiency anemia, unspecified: Secondary | ICD-10-CM | POA: Diagnosis not present

## 2018-04-18 ENCOUNTER — Telehealth (INDEPENDENT_AMBULATORY_CARE_PROVIDER_SITE_OTHER): Payer: Self-pay | Admitting: *Deleted

## 2018-04-23 DIAGNOSIS — F329 Major depressive disorder, single episode, unspecified: Secondary | ICD-10-CM | POA: Diagnosis not present

## 2018-04-23 DIAGNOSIS — Z9049 Acquired absence of other specified parts of digestive tract: Secondary | ICD-10-CM | POA: Diagnosis not present

## 2018-04-23 DIAGNOSIS — R1084 Generalized abdominal pain: Secondary | ICD-10-CM | POA: Diagnosis not present

## 2018-04-23 DIAGNOSIS — R112 Nausea with vomiting, unspecified: Secondary | ICD-10-CM | POA: Diagnosis not present

## 2018-04-23 DIAGNOSIS — I10 Essential (primary) hypertension: Secondary | ICD-10-CM | POA: Diagnosis not present

## 2018-04-23 DIAGNOSIS — M797 Fibromyalgia: Secondary | ICD-10-CM | POA: Diagnosis not present

## 2018-04-23 DIAGNOSIS — R110 Nausea: Secondary | ICD-10-CM | POA: Diagnosis not present

## 2018-04-23 DIAGNOSIS — C9511 Chronic leukemia of unspecified cell type, in remission: Secondary | ICD-10-CM | POA: Diagnosis not present

## 2018-04-23 DIAGNOSIS — F418 Other specified anxiety disorders: Secondary | ICD-10-CM | POA: Diagnosis not present

## 2018-04-23 DIAGNOSIS — Z79899 Other long term (current) drug therapy: Secondary | ICD-10-CM | POA: Diagnosis not present

## 2018-04-23 DIAGNOSIS — C169 Malignant neoplasm of stomach, unspecified: Secondary | ICD-10-CM | POA: Diagnosis not present

## 2018-04-23 DIAGNOSIS — K7689 Other specified diseases of liver: Secondary | ICD-10-CM | POA: Diagnosis not present

## 2018-04-23 DIAGNOSIS — M1990 Unspecified osteoarthritis, unspecified site: Secondary | ICD-10-CM | POA: Diagnosis not present

## 2018-04-23 DIAGNOSIS — C799 Secondary malignant neoplasm of unspecified site: Secondary | ICD-10-CM | POA: Diagnosis not present

## 2018-04-28 ENCOUNTER — Other Ambulatory Visit (INDEPENDENT_AMBULATORY_CARE_PROVIDER_SITE_OTHER): Payer: Self-pay | Admitting: Physical Medicine and Rehabilitation

## 2018-04-28 DIAGNOSIS — F411 Generalized anxiety disorder: Secondary | ICD-10-CM

## 2018-04-28 MED ORDER — DIAZEPAM 5 MG PO TABS: ORAL_TABLET | ORAL | 0 refills | Status: AC

## 2018-04-28 NOTE — Progress Notes (Signed)
Pre-procedure diazepam ordered for pre-operative anxiety.  

## 2018-04-30 DIAGNOSIS — D3A8 Other benign neuroendocrine tumors: Secondary | ICD-10-CM | POA: Diagnosis not present

## 2018-04-30 DIAGNOSIS — C7A098 Malignant carcinoid tumors of other sites: Secondary | ICD-10-CM | POA: Diagnosis not present

## 2018-04-30 DIAGNOSIS — Z79899 Other long term (current) drug therapy: Secondary | ICD-10-CM | POA: Diagnosis not present

## 2018-05-08 ENCOUNTER — Encounter (INDEPENDENT_AMBULATORY_CARE_PROVIDER_SITE_OTHER): Payer: Self-pay | Admitting: Physical Medicine and Rehabilitation

## 2018-05-08 ENCOUNTER — Ambulatory Visit (INDEPENDENT_AMBULATORY_CARE_PROVIDER_SITE_OTHER): Payer: Medicare Other | Admitting: Physical Medicine and Rehabilitation

## 2018-05-08 ENCOUNTER — Ambulatory Visit (INDEPENDENT_AMBULATORY_CARE_PROVIDER_SITE_OTHER): Payer: Self-pay

## 2018-05-08 VITALS — BP 149/69 | HR 45 | Temp 98.4°F

## 2018-05-08 DIAGNOSIS — M47816 Spondylosis without myelopathy or radiculopathy, lumbar region: Secondary | ICD-10-CM | POA: Diagnosis not present

## 2018-05-08 MED ORDER — METHYLPREDNISOLONE ACETATE 80 MG/ML IJ SUSP
80.0000 mg | Freq: Once | INTRAMUSCULAR | Status: AC
  Administered 2018-05-08: 80 mg

## 2018-05-08 NOTE — Progress Notes (Signed)
Numeric Pain Rating Scale and Functional Assessment Average Pain 9   In the last MONTH (on 0-10 scale) has pain interfered with the following?  1. General activity like being  able to carry out your everyday physical activities such as walking, climbing stairs, carrying groceries, or moving a chair?  Rating(7)   +Driver, -BT, -Dye Allergies. 

## 2018-05-09 NOTE — Procedures (Signed)
Lumbar Facet Joint Intra-Articular Injection(s) with Fluoroscopic Guidance  Patient: Margaret Cowan      Date of Birth: 11/28/1950 MRN: [id number] PCP: Redmond School, MD      Visit Date: 05/08/2018   Universal Protocol:    Date/Time: 05/08/2018  Consent Given By: the patient  Position: PRONE   Additional Comments: Vital signs were monitored before and after the procedure. Patient was prepped and draped in the usual sterile fashion. The correct patient, procedure, and site was verified.   Injection Procedure Details:  Procedure Site One Meds Administered:  Meds ordered this encounter  Medications  . methylPREDNISolone acetate (DEPO-MEDROL) injection 80 mg     Laterality: Bilateral  Location/Site:  L4-L5 L5-S1  Needle size: 22 guage  Needle type: Spinal  Needle Placement: Articular  Findings:  -Comments: Excellent flow of contrast producing a partial arthrogram.  Procedure Details: The fluoroscope beam is vertically oriented in AP, and the inferior recess is visualized beneath the lower pole of the inferior apophyseal process, which represents the target point for needle insertion. When direct visualization is difficult the target point is located at the medial projection of the vertebral pedicle. The region overlying each aforementioned target is locally anesthetized with a 1 to 2 ml. volume of 1% Lidocaine without Epinephrine.   The spinal needle was inserted into each of the above mentioned facet joints using biplanar fluoroscopic guidance. A 0.25 to 0.5 ml. volume of Isovue-250 was injected and a partial facet joint arthrogram was obtained. A single spot film was obtained of the resulting arthrogram.    One to 1.25 ml of the steroid/anesthetic solution was then injected into each of the facet joints noted above.   Additional Comments:  The patient tolerated the procedure well Dressing: Band-Aid    Post-procedure details: Patient was observed during the  procedure. Post-procedure instructions were reviewed.  Patient left the clinic in stable condition.
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
## 2018-05-21 DIAGNOSIS — L821 Other seborrheic keratosis: Secondary | ICD-10-CM | POA: Diagnosis not present

## 2018-05-21 DIAGNOSIS — D1801 Hemangioma of skin and subcutaneous tissue: Secondary | ICD-10-CM | POA: Diagnosis not present

## 2018-05-28 DIAGNOSIS — Z79899 Other long term (current) drug therapy: Secondary | ICD-10-CM | POA: Diagnosis not present

## 2018-05-28 DIAGNOSIS — C7A098 Malignant carcinoid tumors of other sites: Secondary | ICD-10-CM | POA: Diagnosis not present

## 2018-07-02 DIAGNOSIS — C7A096 Malignant carcinoid tumor of the hindgut NOS: Secondary | ICD-10-CM | POA: Diagnosis not present

## 2018-07-02 DIAGNOSIS — C9210 Chronic myeloid leukemia, BCR/ABL-positive, not having achieved remission: Secondary | ICD-10-CM | POA: Diagnosis not present

## 2018-07-02 DIAGNOSIS — D3A00 Benign carcinoid tumor of unspecified site: Secondary | ICD-10-CM | POA: Diagnosis not present

## 2018-07-02 DIAGNOSIS — R933 Abnormal findings on diagnostic imaging of other parts of digestive tract: Secondary | ICD-10-CM | POA: Diagnosis not present

## 2018-07-03 ENCOUNTER — Other Ambulatory Visit (HOSPITAL_COMMUNITY): Payer: Self-pay | Admitting: Oncology

## 2018-07-03 ENCOUNTER — Other Ambulatory Visit: Payer: Self-pay | Admitting: Oncology

## 2018-07-03 DIAGNOSIS — R933 Abnormal findings on diagnostic imaging of other parts of digestive tract: Secondary | ICD-10-CM

## 2018-07-03 DIAGNOSIS — D3A00 Benign carcinoid tumor of unspecified site: Secondary | ICD-10-CM

## 2018-07-15 ENCOUNTER — Encounter (HOSPITAL_COMMUNITY): Admission: RE | Admit: 2018-07-15 | Payer: Medicare Other | Source: Ambulatory Visit

## 2018-07-22 ENCOUNTER — Encounter (HOSPITAL_COMMUNITY)
Admission: RE | Admit: 2018-07-22 | Discharge: 2018-07-22 | Disposition: A | Discharge status: Home / Self Care | Payer: Medicare Other | Source: Ambulatory Visit | Attending: Oncology | Admitting: Oncology

## 2018-07-22 ENCOUNTER — Other Ambulatory Visit: Payer: Self-pay

## 2018-07-22 DIAGNOSIS — D3A00 Benign carcinoid tumor of unspecified site: Secondary | ICD-10-CM | POA: Diagnosis not present

## 2018-07-22 DIAGNOSIS — C7A1 Malignant poorly differentiated neuroendocrine tumors: Secondary | ICD-10-CM | POA: Diagnosis not present

## 2018-07-22 DIAGNOSIS — R933 Abnormal findings on diagnostic imaging of other parts of digestive tract: Secondary | ICD-10-CM | POA: Diagnosis not present

## 2018-07-22 DIAGNOSIS — C801 Malignant (primary) neoplasm, unspecified: Secondary | ICD-10-CM | POA: Insufficient documentation

## 2018-07-22 MED ORDER — GALLIUM GA 68 DOTATATE IV KIT
3.9700 | PACK | Freq: Once | INTRAVENOUS | Status: AC | PRN
  Administered 2018-07-22: 3.97 via INTRAVENOUS

## 2018-07-24 DIAGNOSIS — Z09 Encounter for follow-up examination after completed treatment for conditions other than malignant neoplasm: Secondary | ICD-10-CM | POA: Diagnosis not present

## 2018-07-24 DIAGNOSIS — D3A00 Benign carcinoid tumor of unspecified site: Secondary | ICD-10-CM | POA: Diagnosis not present

## 2018-07-24 DIAGNOSIS — C9210 Chronic myeloid leukemia, BCR/ABL-positive, not having achieved remission: Secondary | ICD-10-CM | POA: Diagnosis not present

## 2018-08-06 DIAGNOSIS — D3A00 Benign carcinoid tumor of unspecified site: Secondary | ICD-10-CM | POA: Diagnosis not present

## 2018-08-29 DIAGNOSIS — C7A096 Malignant carcinoid tumor of the hindgut NOS: Secondary | ICD-10-CM | POA: Diagnosis not present

## 2018-08-29 DIAGNOSIS — C9210 Chronic myeloid leukemia, BCR/ABL-positive, not having achieved remission: Secondary | ICD-10-CM | POA: Diagnosis not present

## 2018-08-29 DIAGNOSIS — D473 Essential (hemorrhagic) thrombocythemia: Secondary | ICD-10-CM | POA: Diagnosis not present

## 2018-08-29 DIAGNOSIS — D1803 Hemangioma of intra-abdominal structures: Secondary | ICD-10-CM | POA: Diagnosis not present

## 2018-08-29 DIAGNOSIS — Z09 Encounter for follow-up examination after completed treatment for conditions other than malignant neoplasm: Secondary | ICD-10-CM | POA: Diagnosis not present

## 2018-08-29 DIAGNOSIS — D3A00 Benign carcinoid tumor of unspecified site: Secondary | ICD-10-CM | POA: Diagnosis not present

## 2018-09-03 DIAGNOSIS — D3A00 Benign carcinoid tumor of unspecified site: Secondary | ICD-10-CM | POA: Diagnosis not present

## 2018-10-01 DIAGNOSIS — D1803 Hemangioma of intra-abdominal structures: Secondary | ICD-10-CM | POA: Diagnosis not present

## 2018-10-01 DIAGNOSIS — C9210 Chronic myeloid leukemia, BCR/ABL-positive, not having achieved remission: Secondary | ICD-10-CM | POA: Diagnosis not present

## 2018-10-01 DIAGNOSIS — Z09 Encounter for follow-up examination after completed treatment for conditions other than malignant neoplasm: Secondary | ICD-10-CM | POA: Diagnosis not present

## 2018-10-01 DIAGNOSIS — D3A00 Benign carcinoid tumor of unspecified site: Secondary | ICD-10-CM | POA: Diagnosis not present

## 2018-10-01 DIAGNOSIS — C7A096 Malignant carcinoid tumor of the hindgut NOS: Secondary | ICD-10-CM | POA: Diagnosis not present

## 2018-10-01 DIAGNOSIS — C7B8 Other secondary neuroendocrine tumors: Secondary | ICD-10-CM | POA: Diagnosis not present

## 2018-10-01 DIAGNOSIS — C7A8 Other malignant neuroendocrine tumors: Secondary | ICD-10-CM | POA: Diagnosis not present

## 2018-10-02 DIAGNOSIS — D3A00 Benign carcinoid tumor of unspecified site: Secondary | ICD-10-CM | POA: Diagnosis not present

## 2018-10-09 DIAGNOSIS — Z5181 Encounter for therapeutic drug level monitoring: Secondary | ICD-10-CM | POA: Diagnosis not present

## 2018-10-09 DIAGNOSIS — Z79899 Other long term (current) drug therapy: Secondary | ICD-10-CM | POA: Diagnosis not present

## 2018-11-03 DIAGNOSIS — C7B8 Other secondary neuroendocrine tumors: Secondary | ICD-10-CM | POA: Diagnosis not present

## 2018-11-03 DIAGNOSIS — Z09 Encounter for follow-up examination after completed treatment for conditions other than malignant neoplasm: Secondary | ICD-10-CM | POA: Diagnosis not present

## 2018-11-03 DIAGNOSIS — C9210 Chronic myeloid leukemia, BCR/ABL-positive, not having achieved remission: Secondary | ICD-10-CM | POA: Diagnosis not present

## 2018-11-03 DIAGNOSIS — D473 Essential (hemorrhagic) thrombocythemia: Secondary | ICD-10-CM | POA: Diagnosis not present

## 2018-11-03 DIAGNOSIS — D3A00 Benign carcinoid tumor of unspecified site: Secondary | ICD-10-CM | POA: Diagnosis not present

## 2018-11-03 DIAGNOSIS — C7A8 Other malignant neuroendocrine tumors: Secondary | ICD-10-CM | POA: Diagnosis not present

## 2018-11-04 DIAGNOSIS — C7B00 Secondary carcinoid tumors, unspecified site: Secondary | ICD-10-CM | POA: Diagnosis not present

## 2018-11-04 DIAGNOSIS — K5900 Constipation, unspecified: Secondary | ICD-10-CM | POA: Diagnosis not present

## 2018-11-04 DIAGNOSIS — I10 Essential (primary) hypertension: Secondary | ICD-10-CM | POA: Diagnosis not present

## 2018-11-04 DIAGNOSIS — Z6829 Body mass index (BMI) 29.0-29.9, adult: Secondary | ICD-10-CM | POA: Diagnosis not present

## 2018-11-05 DIAGNOSIS — D3A00 Benign carcinoid tumor of unspecified site: Secondary | ICD-10-CM | POA: Diagnosis not present

## 2018-11-21 ENCOUNTER — Other Ambulatory Visit (HOSPITAL_COMMUNITY): Payer: Self-pay | Admitting: Oncology

## 2018-11-21 DIAGNOSIS — C7B8 Other secondary neuroendocrine tumors: Secondary | ICD-10-CM

## 2018-11-21 DIAGNOSIS — C7A8 Other malignant neuroendocrine tumors: Secondary | ICD-10-CM

## 2018-11-26 DIAGNOSIS — R928 Other abnormal and inconclusive findings on diagnostic imaging of breast: Secondary | ICD-10-CM | POA: Diagnosis not present

## 2018-11-26 DIAGNOSIS — N6325 Unspecified lump in the left breast, overlapping quadrants: Secondary | ICD-10-CM | POA: Diagnosis not present

## 2018-11-26 DIAGNOSIS — N644 Mastodynia: Secondary | ICD-10-CM | POA: Diagnosis not present

## 2018-11-27 ENCOUNTER — Ambulatory Visit (HOSPITAL_COMMUNITY): Payer: Medicare Other

## 2018-12-02 DIAGNOSIS — D3A00 Benign carcinoid tumor of unspecified site: Secondary | ICD-10-CM | POA: Diagnosis not present

## 2018-12-02 DIAGNOSIS — Z09 Encounter for follow-up examination after completed treatment for conditions other than malignant neoplasm: Secondary | ICD-10-CM | POA: Diagnosis not present

## 2018-12-02 DIAGNOSIS — C9210 Chronic myeloid leukemia, BCR/ABL-positive, not having achieved remission: Secondary | ICD-10-CM | POA: Diagnosis not present

## 2018-12-02 DIAGNOSIS — C7B8 Other secondary neuroendocrine tumors: Secondary | ICD-10-CM | POA: Diagnosis not present

## 2018-12-02 DIAGNOSIS — K589 Irritable bowel syndrome without diarrhea: Secondary | ICD-10-CM | POA: Diagnosis not present

## 2018-12-02 DIAGNOSIS — C7A8 Other malignant neuroendocrine tumors: Secondary | ICD-10-CM | POA: Diagnosis not present

## 2018-12-02 DIAGNOSIS — D473 Essential (hemorrhagic) thrombocythemia: Secondary | ICD-10-CM | POA: Diagnosis not present

## 2018-12-04 ENCOUNTER — Encounter (HOSPITAL_COMMUNITY): Payer: Self-pay

## 2018-12-04 ENCOUNTER — Ambulatory Visit (HOSPITAL_COMMUNITY): Payer: Medicare Other

## 2018-12-04 DIAGNOSIS — Z5111 Encounter for antineoplastic chemotherapy: Secondary | ICD-10-CM | POA: Diagnosis not present

## 2018-12-04 DIAGNOSIS — D3A00 Benign carcinoid tumor of unspecified site: Secondary | ICD-10-CM | POA: Diagnosis not present

## 2018-12-11 ENCOUNTER — Other Ambulatory Visit (HOSPITAL_COMMUNITY): Payer: Self-pay | Admitting: Oncology

## 2018-12-11 DIAGNOSIS — D3A00 Benign carcinoid tumor of unspecified site: Secondary | ICD-10-CM

## 2018-12-15 ENCOUNTER — Encounter (HOSPITAL_COMMUNITY): Payer: Medicare Other

## 2018-12-15 ENCOUNTER — Encounter (HOSPITAL_COMMUNITY): Payer: Self-pay

## 2018-12-19 ENCOUNTER — Other Ambulatory Visit: Payer: Self-pay

## 2018-12-19 ENCOUNTER — Ambulatory Visit (HOSPITAL_COMMUNITY)
Admission: RE | Admit: 2018-12-19 | Discharge: 2018-12-19 | Disposition: A | Discharge status: Home / Self Care | Payer: Medicare Other | Source: Ambulatory Visit | Attending: Oncology | Admitting: Oncology

## 2018-12-19 DIAGNOSIS — D3A00 Benign carcinoid tumor of unspecified site: Secondary | ICD-10-CM | POA: Diagnosis not present

## 2018-12-19 DIAGNOSIS — C7A1 Malignant poorly differentiated neuroendocrine tumors: Secondary | ICD-10-CM | POA: Diagnosis not present

## 2018-12-19 MED ORDER — GALLIUM GA 68 DOTATATE IV KIT
4.4000 | PACK | Freq: Once | INTRAVENOUS | Status: AC | PRN
  Administered 2018-12-19: 4.4 via INTRAVENOUS

## 2018-12-30 DIAGNOSIS — E039 Hypothyroidism, unspecified: Secondary | ICD-10-CM | POA: Diagnosis not present

## 2018-12-30 DIAGNOSIS — C7A8 Other malignant neuroendocrine tumors: Secondary | ICD-10-CM | POA: Diagnosis not present

## 2018-12-30 DIAGNOSIS — C7A00 Malignant carcinoid tumor of unspecified site: Secondary | ICD-10-CM | POA: Diagnosis not present

## 2018-12-30 DIAGNOSIS — D473 Essential (hemorrhagic) thrombocythemia: Secondary | ICD-10-CM | POA: Diagnosis not present

## 2018-12-30 DIAGNOSIS — Z09 Encounter for follow-up examination after completed treatment for conditions other than malignant neoplasm: Secondary | ICD-10-CM | POA: Diagnosis not present

## 2018-12-30 DIAGNOSIS — C7B8 Other secondary neuroendocrine tumors: Secondary | ICD-10-CM | POA: Diagnosis not present

## 2018-12-30 DIAGNOSIS — C9210 Chronic myeloid leukemia, BCR/ABL-positive, not having achieved remission: Secondary | ICD-10-CM | POA: Diagnosis not present

## 2018-12-31 DIAGNOSIS — E7800 Pure hypercholesterolemia, unspecified: Secondary | ICD-10-CM | POA: Diagnosis not present

## 2018-12-31 DIAGNOSIS — C7B8 Other secondary neuroendocrine tumors: Secondary | ICD-10-CM | POA: Diagnosis not present

## 2018-12-31 DIAGNOSIS — E039 Hypothyroidism, unspecified: Secondary | ICD-10-CM | POA: Diagnosis not present

## 2018-12-31 DIAGNOSIS — C7A8 Other malignant neuroendocrine tumors: Secondary | ICD-10-CM | POA: Diagnosis not present

## 2018-12-31 DIAGNOSIS — K219 Gastro-esophageal reflux disease without esophagitis: Secondary | ICD-10-CM | POA: Diagnosis not present

## 2018-12-31 DIAGNOSIS — D473 Essential (hemorrhagic) thrombocythemia: Secondary | ICD-10-CM | POA: Diagnosis not present

## 2018-12-31 DIAGNOSIS — G894 Chronic pain syndrome: Secondary | ICD-10-CM | POA: Diagnosis not present

## 2018-12-31 DIAGNOSIS — C9210 Chronic myeloid leukemia, BCR/ABL-positive, not having achieved remission: Secondary | ICD-10-CM | POA: Diagnosis not present

## 2019-01-01 DIAGNOSIS — D3A00 Benign carcinoid tumor of unspecified site: Secondary | ICD-10-CM | POA: Diagnosis not present

## 2019-01-27 DIAGNOSIS — C7A00 Malignant carcinoid tumor of unspecified site: Secondary | ICD-10-CM | POA: Diagnosis not present

## 2019-01-27 DIAGNOSIS — C7B8 Other secondary neuroendocrine tumors: Secondary | ICD-10-CM | POA: Diagnosis not present

## 2019-01-27 DIAGNOSIS — C9210 Chronic myeloid leukemia, BCR/ABL-positive, not having achieved remission: Secondary | ICD-10-CM | POA: Diagnosis not present

## 2019-01-27 DIAGNOSIS — E039 Hypothyroidism, unspecified: Secondary | ICD-10-CM | POA: Diagnosis not present

## 2019-01-27 DIAGNOSIS — C7A8 Other malignant neuroendocrine tumors: Secondary | ICD-10-CM | POA: Diagnosis not present

## 2019-01-27 DIAGNOSIS — D473 Essential (hemorrhagic) thrombocythemia: Secondary | ICD-10-CM | POA: Diagnosis not present

## 2019-01-27 DIAGNOSIS — Z09 Encounter for follow-up examination after completed treatment for conditions other than malignant neoplasm: Secondary | ICD-10-CM | POA: Diagnosis not present

## 2019-01-27 DIAGNOSIS — G43009 Migraine without aura, not intractable, without status migrainosus: Secondary | ICD-10-CM | POA: Diagnosis not present

## 2019-01-27 DIAGNOSIS — F418 Other specified anxiety disorders: Secondary | ICD-10-CM | POA: Diagnosis not present

## 2019-01-29 DIAGNOSIS — D3A00 Benign carcinoid tumor of unspecified site: Secondary | ICD-10-CM | POA: Diagnosis not present

## 2019-02-02 DIAGNOSIS — Z23 Encounter for immunization: Secondary | ICD-10-CM | POA: Diagnosis not present

## 2019-02-23 DIAGNOSIS — Z0001 Encounter for general adult medical examination with abnormal findings: Secondary | ICD-10-CM | POA: Diagnosis not present

## 2019-02-23 DIAGNOSIS — E663 Overweight: Secondary | ICD-10-CM | POA: Diagnosis not present

## 2019-02-23 DIAGNOSIS — I10 Essential (primary) hypertension: Secondary | ICD-10-CM | POA: Diagnosis not present

## 2019-02-23 DIAGNOSIS — Z6826 Body mass index (BMI) 26.0-26.9, adult: Secondary | ICD-10-CM | POA: Diagnosis not present

## 2019-02-23 DIAGNOSIS — F419 Anxiety disorder, unspecified: Secondary | ICD-10-CM | POA: Diagnosis not present

## 2019-02-23 DIAGNOSIS — E039 Hypothyroidism, unspecified: Secondary | ICD-10-CM | POA: Diagnosis not present

## 2019-02-23 DIAGNOSIS — E063 Autoimmune thyroiditis: Secondary | ICD-10-CM | POA: Diagnosis not present

## 2019-02-23 DIAGNOSIS — E538 Deficiency of other specified B group vitamins: Secondary | ICD-10-CM | POA: Diagnosis not present

## 2019-02-23 DIAGNOSIS — Z1389 Encounter for screening for other disorder: Secondary | ICD-10-CM | POA: Diagnosis not present

## 2019-02-23 DIAGNOSIS — E559 Vitamin D deficiency, unspecified: Secondary | ICD-10-CM | POA: Diagnosis not present

## 2019-02-23 DIAGNOSIS — G894 Chronic pain syndrome: Secondary | ICD-10-CM | POA: Diagnosis not present

## 2019-02-24 DIAGNOSIS — C7B8 Other secondary neuroendocrine tumors: Secondary | ICD-10-CM | POA: Diagnosis not present

## 2019-02-24 DIAGNOSIS — C7A00 Malignant carcinoid tumor of unspecified site: Secondary | ICD-10-CM | POA: Diagnosis not present

## 2019-02-24 DIAGNOSIS — D473 Essential (hemorrhagic) thrombocythemia: Secondary | ICD-10-CM | POA: Diagnosis not present

## 2019-02-24 DIAGNOSIS — C9210 Chronic myeloid leukemia, BCR/ABL-positive, not having achieved remission: Secondary | ICD-10-CM | POA: Diagnosis not present

## 2019-02-24 DIAGNOSIS — C7A8 Other malignant neuroendocrine tumors: Secondary | ICD-10-CM | POA: Diagnosis not present

## 2019-02-24 DIAGNOSIS — Z09 Encounter for follow-up examination after completed treatment for conditions other than malignant neoplasm: Secondary | ICD-10-CM | POA: Diagnosis not present

## 2019-03-02 DIAGNOSIS — D3A00 Benign carcinoid tumor of unspecified site: Secondary | ICD-10-CM | POA: Diagnosis not present

## 2019-03-10 DIAGNOSIS — I10 Essential (primary) hypertension: Secondary | ICD-10-CM | POA: Diagnosis not present

## 2019-03-10 DIAGNOSIS — G4733 Obstructive sleep apnea (adult) (pediatric): Secondary | ICD-10-CM | POA: Diagnosis not present

## 2019-03-23 DIAGNOSIS — G894 Chronic pain syndrome: Secondary | ICD-10-CM | POA: Diagnosis not present

## 2019-04-02 DIAGNOSIS — I110 Hypertensive heart disease with heart failure: Secondary | ICD-10-CM | POA: Diagnosis not present

## 2019-04-02 DIAGNOSIS — G894 Chronic pain syndrome: Secondary | ICD-10-CM | POA: Diagnosis not present

## 2019-04-02 DIAGNOSIS — I5032 Chronic diastolic (congestive) heart failure: Secondary | ICD-10-CM | POA: Diagnosis not present

## 2019-04-02 DIAGNOSIS — M159 Polyosteoarthritis, unspecified: Secondary | ICD-10-CM | POA: Diagnosis not present

## 2019-04-08 DIAGNOSIS — G8929 Other chronic pain: Secondary | ICD-10-CM | POA: Diagnosis not present

## 2019-04-08 DIAGNOSIS — R110 Nausea: Secondary | ICD-10-CM | POA: Diagnosis not present

## 2019-04-08 DIAGNOSIS — R109 Unspecified abdominal pain: Secondary | ICD-10-CM | POA: Diagnosis not present

## 2019-04-08 DIAGNOSIS — M545 Low back pain: Secondary | ICD-10-CM | POA: Diagnosis not present

## 2019-04-23 DIAGNOSIS — G893 Neoplasm related pain (acute) (chronic): Secondary | ICD-10-CM | POA: Diagnosis not present

## 2019-04-23 DIAGNOSIS — R110 Nausea: Secondary | ICD-10-CM | POA: Diagnosis not present

## 2019-04-23 DIAGNOSIS — R109 Unspecified abdominal pain: Secondary | ICD-10-CM | POA: Diagnosis not present

## 2019-04-23 DIAGNOSIS — M545 Low back pain: Secondary | ICD-10-CM | POA: Diagnosis not present

## 2019-04-24 DIAGNOSIS — R200 Anesthesia of skin: Secondary | ICD-10-CM | POA: Diagnosis not present

## 2019-04-24 DIAGNOSIS — I361 Nonrheumatic tricuspid (valve) insufficiency: Secondary | ICD-10-CM | POA: Diagnosis not present

## 2019-04-24 DIAGNOSIS — Z743 Need for continuous supervision: Secondary | ICD-10-CM | POA: Diagnosis not present

## 2019-04-24 DIAGNOSIS — R29818 Other symptoms and signs involving the nervous system: Secondary | ICD-10-CM | POA: Diagnosis not present

## 2019-04-24 DIAGNOSIS — I340 Nonrheumatic mitral (valve) insufficiency: Secondary | ICD-10-CM | POA: Diagnosis not present

## 2019-04-24 DIAGNOSIS — I351 Nonrheumatic aortic (valve) insufficiency: Secondary | ICD-10-CM | POA: Diagnosis not present

## 2019-04-24 DIAGNOSIS — E039 Hypothyroidism, unspecified: Secondary | ICD-10-CM | POA: Diagnosis not present

## 2019-04-24 DIAGNOSIS — Z79899 Other long term (current) drug therapy: Secondary | ICD-10-CM | POA: Diagnosis not present

## 2019-04-24 DIAGNOSIS — C7A098 Malignant carcinoid tumors of other sites: Secondary | ICD-10-CM | POA: Diagnosis not present

## 2019-04-24 DIAGNOSIS — E785 Hyperlipidemia, unspecified: Secondary | ICD-10-CM | POA: Diagnosis not present

## 2019-04-24 DIAGNOSIS — C9111 Chronic lymphocytic leukemia of B-cell type in remission: Secondary | ICD-10-CM | POA: Diagnosis not present

## 2019-04-24 DIAGNOSIS — C9211 Chronic myeloid leukemia, BCR/ABL-positive, in remission: Secondary | ICD-10-CM | POA: Diagnosis not present

## 2019-04-24 DIAGNOSIS — I160 Hypertensive urgency: Secondary | ICD-10-CM | POA: Diagnosis not present

## 2019-04-24 DIAGNOSIS — G43909 Migraine, unspecified, not intractable, without status migrainosus: Secondary | ICD-10-CM | POA: Diagnosis not present

## 2019-04-24 DIAGNOSIS — I161 Hypertensive emergency: Secondary | ICD-10-CM | POA: Diagnosis not present

## 2019-04-24 DIAGNOSIS — R0789 Other chest pain: Secondary | ICD-10-CM | POA: Diagnosis not present

## 2019-04-24 DIAGNOSIS — C7B09 Secondary carcinoid tumors of other sites: Secondary | ICD-10-CM | POA: Diagnosis not present

## 2019-04-24 DIAGNOSIS — R079 Chest pain, unspecified: Secondary | ICD-10-CM | POA: Diagnosis not present

## 2019-04-24 DIAGNOSIS — Z888 Allergy status to other drugs, medicaments and biological substances status: Secondary | ICD-10-CM | POA: Diagnosis not present

## 2019-04-24 DIAGNOSIS — R001 Bradycardia, unspecified: Secondary | ICD-10-CM | POA: Diagnosis not present

## 2019-04-24 DIAGNOSIS — Z20822 Contact with and (suspected) exposure to covid-19: Secondary | ICD-10-CM | POA: Diagnosis not present

## 2019-04-24 DIAGNOSIS — G4733 Obstructive sleep apnea (adult) (pediatric): Secondary | ICD-10-CM | POA: Diagnosis not present

## 2019-04-24 DIAGNOSIS — I10 Essential (primary) hypertension: Secondary | ICD-10-CM | POA: Diagnosis not present

## 2019-04-25 DIAGNOSIS — I160 Hypertensive urgency: Secondary | ICD-10-CM | POA: Diagnosis not present

## 2019-04-25 DIAGNOSIS — I10 Essential (primary) hypertension: Secondary | ICD-10-CM | POA: Diagnosis not present

## 2019-04-25 DIAGNOSIS — R079 Chest pain, unspecified: Secondary | ICD-10-CM | POA: Diagnosis not present

## 2019-04-25 DIAGNOSIS — R0789 Other chest pain: Secondary | ICD-10-CM | POA: Diagnosis not present

## 2019-04-26 DIAGNOSIS — I10 Essential (primary) hypertension: Secondary | ICD-10-CM | POA: Diagnosis not present

## 2019-04-26 DIAGNOSIS — I160 Hypertensive urgency: Secondary | ICD-10-CM | POA: Diagnosis not present

## 2019-04-26 DIAGNOSIS — R001 Bradycardia, unspecified: Secondary | ICD-10-CM | POA: Diagnosis not present

## 2019-04-26 DIAGNOSIS — R079 Chest pain, unspecified: Secondary | ICD-10-CM | POA: Diagnosis not present

## 2019-04-26 DIAGNOSIS — R0789 Other chest pain: Secondary | ICD-10-CM | POA: Diagnosis not present

## 2019-04-27 DIAGNOSIS — R079 Chest pain, unspecified: Secondary | ICD-10-CM | POA: Diagnosis not present

## 2019-04-27 DIAGNOSIS — I160 Hypertensive urgency: Secondary | ICD-10-CM | POA: Diagnosis not present

## 2019-04-27 DIAGNOSIS — R0789 Other chest pain: Secondary | ICD-10-CM | POA: Diagnosis not present

## 2019-04-30 DIAGNOSIS — C9211 Chronic myeloid leukemia, BCR/ABL-positive, in remission: Secondary | ICD-10-CM | POA: Diagnosis not present

## 2019-04-30 DIAGNOSIS — C7A098 Malignant carcinoid tumors of other sites: Secondary | ICD-10-CM | POA: Diagnosis not present

## 2019-05-03 DIAGNOSIS — I110 Hypertensive heart disease with heart failure: Secondary | ICD-10-CM | POA: Diagnosis not present

## 2019-05-03 DIAGNOSIS — M159 Polyosteoarthritis, unspecified: Secondary | ICD-10-CM | POA: Diagnosis not present

## 2019-05-03 DIAGNOSIS — G894 Chronic pain syndrome: Secondary | ICD-10-CM | POA: Diagnosis not present

## 2019-05-03 DIAGNOSIS — I5032 Chronic diastolic (congestive) heart failure: Secondary | ICD-10-CM | POA: Diagnosis not present
